# Patient Record
Sex: Male | Born: 1960 | Race: Black or African American | Hispanic: No | Smoking: Current every day smoker
Health system: Southern US, Community
[De-identification: ages and names within clinical notes are randomized; demographics above are authoritative.]

## PROBLEM LIST (undated history)

## (undated) DIAGNOSIS — R519 Headache, unspecified: Secondary | ICD-10-CM

## (undated) DIAGNOSIS — F329 Major depressive disorder, single episode, unspecified: Secondary | ICD-10-CM

## (undated) DIAGNOSIS — T7840XA Allergy, unspecified, initial encounter: Secondary | ICD-10-CM

## (undated) DIAGNOSIS — K219 Gastro-esophageal reflux disease without esophagitis: Secondary | ICD-10-CM

## (undated) DIAGNOSIS — M1990 Unspecified osteoarthritis, unspecified site: Secondary | ICD-10-CM

## (undated) DIAGNOSIS — R7303 Prediabetes: Secondary | ICD-10-CM

## (undated) DIAGNOSIS — F32A Depression, unspecified: Secondary | ICD-10-CM

## (undated) DIAGNOSIS — B999 Unspecified infectious disease: Secondary | ICD-10-CM

## (undated) DIAGNOSIS — R51 Headache: Secondary | ICD-10-CM

## (undated) DIAGNOSIS — Z8781 Personal history of (healed) traumatic fracture: Secondary | ICD-10-CM

## (undated) DIAGNOSIS — E119 Type 2 diabetes mellitus without complications: Secondary | ICD-10-CM

## (undated) DIAGNOSIS — Z09 Encounter for follow-up examination after completed treatment for conditions other than malignant neoplasm: Secondary | ICD-10-CM

## (undated) HISTORY — DX: Type 2 diabetes mellitus without complications: E11.9

## (undated) HISTORY — DX: Allergy, unspecified, initial encounter: T78.40XA

## (undated) HISTORY — PX: FRACTURE SURGERY: SHX138

---

## 2011-01-06 ENCOUNTER — Emergency Department (HOSPITAL_COMMUNITY)
Admission: EM | Admit: 2011-01-06 | Discharge: 2011-01-07 | Disposition: A | Discharge status: Home / Self Care | Attending: Emergency Medicine | Admitting: Emergency Medicine

## 2011-01-06 ENCOUNTER — Ambulatory Visit (INDEPENDENT_AMBULATORY_CARE_PROVIDER_SITE_OTHER)

## 2011-01-06 ENCOUNTER — Emergency Department (HOSPITAL_COMMUNITY)

## 2011-01-06 ENCOUNTER — Encounter: Payer: Self-pay | Admitting: *Deleted

## 2011-01-06 ENCOUNTER — Other Ambulatory Visit: Payer: Self-pay

## 2011-01-06 DIAGNOSIS — M79609 Pain in unspecified limb: Secondary | ICD-10-CM | POA: Insufficient documentation

## 2011-01-06 DIAGNOSIS — J3489 Other specified disorders of nose and nasal sinuses: Secondary | ICD-10-CM | POA: Insufficient documentation

## 2011-01-06 DIAGNOSIS — R079 Chest pain, unspecified: Secondary | ICD-10-CM

## 2011-01-06 DIAGNOSIS — M25519 Pain in unspecified shoulder: Secondary | ICD-10-CM | POA: Insufficient documentation

## 2011-01-06 DIAGNOSIS — M542 Cervicalgia: Secondary | ICD-10-CM | POA: Insufficient documentation

## 2011-01-06 DIAGNOSIS — R209 Unspecified disturbances of skin sensation: Secondary | ICD-10-CM | POA: Insufficient documentation

## 2011-01-06 LAB — DIFFERENTIAL
Basophils Absolute: 0 10*3/uL (ref 0.0–0.1)
Eosinophils Relative: 1 % (ref 0–5)
Lymphs Abs: 2.9 10*3/uL (ref 0.7–4.0)
Monocytes Absolute: 0.7 10*3/uL (ref 0.1–1.0)
Monocytes Relative: 9 % (ref 3–12)
Neutro Abs: 3.8 10*3/uL (ref 1.7–7.7)

## 2011-01-06 LAB — BASIC METABOLIC PANEL
BUN: 15 mg/dL (ref 6–23)
CO2: 25 mEq/L (ref 19–32)
Calcium: 9.3 mg/dL (ref 8.4–10.5)
Chloride: 99 mEq/L (ref 96–112)
Creatinine, Ser: 0.79 mg/dL (ref 0.50–1.35)

## 2011-01-06 LAB — CBC
HCT: 43.2 % (ref 39.0–52.0)
MCH: 29 pg (ref 26.0–34.0)
MCV: 83.6 fL (ref 78.0–100.0)
Platelets: 191 10*3/uL (ref 150–400)
RDW: 14.6 % (ref 11.5–15.5)
WBC: 7.5 10*3/uL (ref 4.0–10.5)

## 2011-01-06 MED ORDER — DIAZEPAM 5 MG PO TABS: 5.0000 mg | ORAL_TABLET | Freq: Three times a day (TID) | ORAL | Status: AC | PRN

## 2011-01-06 MED ORDER — NAPROXEN 375 MG PO TABS: 375.0000 mg | ORAL_TABLET | Freq: Two times a day (BID) | ORAL | Status: DC

## 2011-01-06 NOTE — ED Notes (Signed)
Pt begin feeling intermittent L shoulder pain (stabbing) 3 days prior that progressed to L thumb numbness and tingling.  Was sent here from [facility] urgent care.  No nitro or asa given.  20G to L AC.

## 2011-01-06 NOTE — ED Notes (Signed)
Pt states pain 1/10.
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
## 2011-01-06 NOTE — Discharge Instructions (Signed)
I suspect your pain may be coming from a pinched nerve in your neck. Possibly a muscle spasms. Your work up for your heat today was normal. However you still must follow up with primary care doctor for a full physical and further evaluation. Return if symptoms worsening. Take naprosyn as prescribed for pain. Take valium as prescribed for muscle spams.   Cervical Radiculopathy Cervical radiculopathy is a pinched nerve in the neck. When this happens you may have pain or numbness shooting from your neck all the way down into your arm and fingers. There are many causes of this problem. Sometimes this may happen from an injury or simply from muscle tightness in the neck from overuse. It may also happen from arthritis or bone problems. If there is no improvement after treatment, further studies may be done to find the exact cause. DIAGNOSIS  X-rays may be needed if the problems become long standing. Electromyograms may be done. This study is one in which the working of nerves and muscles is studied. HOME CARE INSTRUCTIONS   Applications of ice packs may be helpful. Ice can be used in a plastic bag with a towel around it to prevent frostbite to skin. This may be used every 2 hours for 20 to 30 minutes, or as needed, while awake or as directed by your caregiver.   Sleep at night with a flat pillow.   Only take over-the-counter or prescription medicines for pain, discomfort, or fever as directed by your caregiver.   If physical therapy was prescribed, follow your caregiver's directions. If a cervical collar or cervical traction device was prescribed, use them as directed.  SEEK IMMEDIATE MEDICAL CARE IF:   You have pain not controlled with medications.   You seem to be getting worse rather than better.   You develop weakness in your hand or arm.   You develop new numbness or loss of feeling in your arms or legs.   You develop loss of bowel or bladder control.   You have difficulty with walking or  balance, or develop clumsiness in the use of your arms or legs.  MAKE SURE YOU:   Understand these instructions.   Will watch your condition.   Will get help right away if you are not doing well or get worse.  Document Released: 09/22/2000 Document Revised: 07/13/2010 Document Reviewed: 05/03/2007 [facility] Patient Information 2012 [place], [place].

## 2011-01-06 NOTE — ED Notes (Signed)
Pt to Xray.

## 2011-01-07 NOTE — ED Notes (Signed)
Pt to ED c/o L shoulder pain x 3 days.  Pain is like a sharp stabbing.  Today he felt his whole L hand go numb while driving.  Pain increases on movement.  Pt denies any sob, nausea.  Pt has a job stocking boxes, but states he has not been doing anything out of the usual.

## 2011-01-07 NOTE — ED Notes (Signed)
D/c'd by another nurse.

## 2011-01-10 NOTE — ED Provider Notes (Signed)
Medical screening examination/treatment/procedure(s) were performed by non-physician practitioner and as supervising physician I was immediately available for consultation/collaboration.  Hurman Horn, MD 01/10/11 2132

## 2011-07-14 ENCOUNTER — Ambulatory Visit (INDEPENDENT_AMBULATORY_CARE_PROVIDER_SITE_OTHER): Admitting: Family Medicine

## 2011-07-14 VITALS — BP 142/88 | HR 100 | Temp 97.8°F | Resp 18 | Ht 68.0 in | Wt 241.0 lb

## 2011-07-14 DIAGNOSIS — R05 Cough: Secondary | ICD-10-CM

## 2011-07-14 DIAGNOSIS — J40 Bronchitis, not specified as acute or chronic: Secondary | ICD-10-CM

## 2011-07-14 DIAGNOSIS — R059 Cough, unspecified: Secondary | ICD-10-CM

## 2011-07-14 MED ORDER — AZITHROMYCIN 250 MG PO TABS: ORAL_TABLET | ORAL | Status: DC

## 2011-07-14 MED ORDER — HYDROCODONE-HOMATROPINE 5-1.5 MG/5ML PO SYRP: 5.0000 mL | ORAL_SOLUTION | Freq: Three times a day (TID) | ORAL | Status: DC | PRN

## 2011-07-14 MED ORDER — PREDNISONE 20 MG PO TABS: ORAL_TABLET | ORAL | Status: DC

## 2011-07-14 MED ORDER — MOMETASONE FURO-FORMOTEROL FUM 200-5 MCG/ACT IN AERO: 2.0000 | INHALATION_SPRAY | Freq: Two times a day (BID) | RESPIRATORY_TRACT | Status: DC

## 2011-07-14 NOTE — Progress Notes (Signed)
@[email or web address] @   Patient ID: Scott Hebert MRN: [id number], DOB: 03/09/1960, 51 y.o. Date of Encounter: 07/14/2011, 7:03 PM  Primary Physician: No primary provider on file.  Chief Complaint:  Chief Complaint  Patient presents with  . Cough    x 1.5 weeks  . Nasal Congestion    mainly chest congestion     HPI: 51 y.o. year old male presents with a 10 day history of nasal congestion, post nasal drip, sore throat, and cough. Mild sinus pressure. Afebrile. No chills. Nasal congestion thick and green/yellow. Cough is productive of green/yellow sputum and not associated with time of day. Ears feel full, leading to sensation of muffled hearing. Has tried OTC cold preps without success. No GI complaints. Appetite poor  No sick contacts, recent antibiotics, or recent travels.   No leg trauma, sedentary periods, h/o cancer, or tobacco use.  No past medical history on file.   Home Meds: Prior to Admission medications   Medication Sig Start Date End Date Taking? Authorizing Provider  diphenhydramine-acetaminophen (TYLENOL PM) 25-500 MG TABS Take 1 tablet by mouth at bedtime as needed. For sleep    Yes Historical Provider, MD  naphazoline (CLEAR EYES) 0.012 % ophthalmic solution Place 2 drops into both eyes daily as needed. For allergies    Yes Historical Provider, MD  azithromycin (ZITHROMAX Z-PAK) 250 MG tablet Take as directed on pack 07/14/11 07/19/11  Elvina Sidle, MD  HYDROcodone-homatropine [facility]) 5-1.5 MG/5ML syrup Take 5 mLs by mouth every 8 (eight) hours as needed for cough. 07/14/11 07/24/11  Elvina Sidle, MD  Mometasone Furo-Formoterol Fum 200-5 MCG/ACT AERO Inhale 2 puffs into the lungs 2 (two) times daily. 07/14/11   Elvina Sidle, MD  naproxen (NAPROSYN) 375 MG tablet Take 1 tablet (375 mg total) by mouth 2 (two) times daily. 01/06/11 01/06/12  Tatyana A Kirichenko, PA  naproxen sodium (ANAPROX) 220 MG tablet Take 220 mg by mouth daily as needed. For pain     Historical Provider, MD    predniSONE (DELTASONE) 20 MG tablet 2 daily 07/14/11   Elvina Sidle, MD    Allergies: No Known Allergies  History   Social History  . Marital Status: Single    Spouse Name: N/A    Number of Children: N/A  . Years of Education: N/A   Occupational History  . Not on file.   Social History Main Topics  . Smoking status: Current Everyday Smoker  . Smokeless tobacco: Not on file  . Alcohol Use: Not on file  . Drug Use: Not on file  . Sexually Active: Not on file   Other Topics Concern  . Not on file   Social History Narrative  . No narrative on file     Review of Systems: Constitutional: negative for chills, fever, night sweats or weight changes Cardiovascular: negative for chest pain or palpitations Respiratory: negative for hemoptysis, wheezing, or shortness of breath Abdominal: negative for abdominal pain, nausea, vomiting or diarrhea Dermatological: negative for rash Neurologic: negative for headache   Physical Exam: Blood pressure 142/88, pulse 100, temperature 97.8 F (36.6 C), temperature source Oral, resp. rate 18, height 5\' 8"  (1.727 m), weight 241 lb (109.317 kg), SpO2 98.00%., Body mass index is 36.64 kg/(m^2). General: Well developed, well nourished, in no acute distress. Head: Normocephalic, atraumatic, eyes without discharge, sclera non-icteric, nares are congested. Bilateral auditory canals clear, TM's are without perforation, pearly grey with reflective cone of light bilaterally. No sinus TTP. Oral cavity moist, dentition normal. Posterior pharynx with post  nasal drip and mild erythema. No peritonsillar abscess or tonsillar exudate. Neck: Supple. No thyromegaly. Full ROM. No lymphadenopathy. Lungs: Coarse breath sounds bilaterally without wheezes, rales, or rhonchi. Breathing is unlabored.  Heart: RRR with S1 S2. No murmurs, rubs, or gallops appreciated. Msk:  Strength and tone normal for age. Extremities: No clubbing or cyanosis. No edema. Neuro: Alert  and oriented X 3. Moves all extremities spontaneously. CNII-XII grossly in tact. Psych:  Responds to questions appropriately with a normal affect.   Labs:   ASSESSMENT AND PLAN:  51 y.o. year old male with bronchitis. - 1. Bronchitis  predniSONE (DELTASONE) 20 MG tablet, azithromycin (ZITHROMAX Z-PAK) 250 MG tablet, HYDROcodone-homatropine (HYCODAN) 5-1.5 MG/5ML syrup, Mometasone Furo-Formoterol Fum 200-5 MCG/ACT AERO     -Tylenol/Motrin prn -Rest/fluids -RTC precautions -RTC 3-5 days if no improvement  Signed, Elvina Sidle, MD 07/14/2011 7:03 PM

## 2011-07-14 NOTE — Patient Instructions (Addendum)

## 2011-07-15 ENCOUNTER — Encounter (HOSPITAL_COMMUNITY): Payer: Self-pay | Admitting: *Deleted

## 2011-07-15 ENCOUNTER — Emergency Department (HOSPITAL_COMMUNITY)
Admission: EM | Admit: 2011-07-15 | Discharge: 2011-07-15 | Disposition: A | Discharge status: Home / Self Care | Attending: Emergency Medicine | Admitting: Emergency Medicine

## 2011-07-15 DIAGNOSIS — T2000XA Burn of unspecified degree of head, face, and neck, unspecified site, initial encounter: Secondary | ICD-10-CM

## 2011-07-15 DIAGNOSIS — X19XXXA Contact with other heat and hot substances, initial encounter: Secondary | ICD-10-CM | POA: Insufficient documentation

## 2011-07-15 DIAGNOSIS — F17200 Nicotine dependence, unspecified, uncomplicated: Secondary | ICD-10-CM | POA: Insufficient documentation

## 2011-07-15 DIAGNOSIS — T2019XA Burn of first degree of multiple sites of head, face, and neck, initial encounter: Secondary | ICD-10-CM | POA: Insufficient documentation

## 2011-07-15 DIAGNOSIS — T2024XA Burn of second degree of nose (septum), initial encounter: Secondary | ICD-10-CM | POA: Insufficient documentation

## 2011-07-15 MED ORDER — IBUPROFEN 200 MG PO TABS
400.0000 mg | ORAL_TABLET | Freq: Once | ORAL | Status: DC
  Filled 2011-07-15: qty 2

## 2011-07-15 MED ORDER — BACITRACIN ZINC 500 UNIT/GM EX OINT
TOPICAL_OINTMENT | CUTANEOUS | Status: AC
  Filled 2011-07-15: qty 0.9

## 2011-07-15 MED ORDER — BACITRACIN 500 UNIT/GM EX OINT
1.0000 "application " | TOPICAL_OINTMENT | Freq: Two times a day (BID) | CUTANEOUS | Status: DC
  Administered 2011-07-15: 1 via TOPICAL
  Filled 2011-07-15: qty 0.9

## 2011-07-15 MED ORDER — BACITRACIN 500 UNIT/GM EX OINT: 1.0000 "application " | TOPICAL_OINTMENT | Freq: Two times a day (BID) | CUTANEOUS | Status: DC

## 2011-07-15 MED ORDER — OXYCODONE-ACETAMINOPHEN 5-325 MG PO TABS
2.0000 | ORAL_TABLET | Freq: Once | ORAL | Status: DC
  Filled 2011-07-15: qty 2

## 2011-07-15 MED ORDER — OXYCODONE-ACETAMINOPHEN 5-325 MG PO TABS: 1.0000 | ORAL_TABLET | ORAL | Status: AC | PRN

## 2011-07-15 MED ORDER — BACITRACIN ZINC 500 UNIT/GM EX OINT: TOPICAL_OINTMENT | Freq: Two times a day (BID) | CUTANEOUS | Status: DC
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
## 2011-07-19 ENCOUNTER — Other Ambulatory Visit: Payer: Self-pay | Admitting: Family Medicine

## 2011-08-17 ENCOUNTER — Ambulatory Visit (INDEPENDENT_AMBULATORY_CARE_PROVIDER_SITE_OTHER): Admitting: Internal Medicine

## 2011-08-17 ENCOUNTER — Ambulatory Visit

## 2011-08-17 VITALS — BP 124/84 | HR 80 | Temp 97.3°F | Resp 16 | Ht 68.0 in | Wt 245.0 lb

## 2011-08-17 DIAGNOSIS — R059 Cough, unspecified: Secondary | ICD-10-CM

## 2011-08-17 DIAGNOSIS — R05 Cough: Secondary | ICD-10-CM

## 2011-08-17 DIAGNOSIS — J9801 Acute bronchospasm: Secondary | ICD-10-CM

## 2011-08-17 DIAGNOSIS — F17200 Nicotine dependence, unspecified, uncomplicated: Secondary | ICD-10-CM

## 2011-08-17 DIAGNOSIS — J45909 Unspecified asthma, uncomplicated: Secondary | ICD-10-CM

## 2011-08-17 LAB — POCT CBC
Granulocyte percent: 51 %G (ref 37–80)
MID (cbc): 0.5 (ref 0–0.9)
MPV: 12.4 fL (ref 0–99.8)
POC Granulocyte: 3.1 (ref 2–6.9)
POC LYMPH PERCENT: 40.9 %L (ref 10–50)
POC MID %: 8.1 %M (ref 0–12)
Platelet Count, POC: 196 10*3/uL (ref 142–424)
RBC: 5.11 M/uL (ref 4.69–6.13)
RDW, POC: 16.1 %

## 2011-08-17 MED ORDER — ALBUTEROL SULFATE HFA 108 (90 BASE) MCG/ACT IN AERS: 2.0000 | INHALATION_SPRAY | Freq: Four times a day (QID) | RESPIRATORY_TRACT | Status: DC | PRN

## 2011-08-17 MED ORDER — METHYLPREDNISOLONE ACETATE 80 MG/ML IJ SUSP
120.0000 mg | Freq: Once | INTRAMUSCULAR | Status: AC
  Administered 2011-08-17: 120 mg via INTRAMUSCULAR

## 2011-08-17 MED ORDER — AZITHROMYCIN 500 MG PO TABS: 500.0000 mg | ORAL_TABLET | Freq: Every day | ORAL | Status: AC

## 2011-08-17 MED ORDER — HYDROCODONE-ACETAMINOPHEN 7.5-500 MG/15ML PO SOLN: 5.0000 mL | Freq: Four times a day (QID) | ORAL | Status: AC | PRN

## 2011-08-17 NOTE — Patient Instructions (Signed)
Chronic Asthmatic Bronchitis  Chronic asthmatic bronchitis is often a complication of frequent asthma and/or bronchitis. After a long enough period of time, the continual airflow blockage is present in spite of treatment for asthma. The medications that used to treat asthma no longer work. The symptoms of chronic bronchitis may also be present. Bronchitis is an inflammation of the breathing tubules in the lungs. The combination of asthma, chronic bronchitis, and emphysema all affect the small breathing tubules (bronchial tree) in our lungs. It is a common condition. The problems from each are similar and overlap with each other so are sometimes hard to diagnose.  When the asthma and bronchitis are combined, there is usually inflammation and infection. The small bronchial tubes produce more mucus. This blocks the airways and makes breathing harder. Usually this process is caused more by external irritants than infection. Smokers with chronic bronchitis are at a greater risk to develop asthmatic bronchitis.  CAUSES    Why some people with asthma go on to develop chronic asthmatic bronchitis is not known. Smoking and environmental toxins or allergens seem to play a role. There are wide differences in who is susceptible.   Abnormalities of the small airways may develop in persons with persistent asthma. Asthmatics can be uncommonly subject to the effects of smoking. Asthma is also found associated with a number of other diseases.  SYMPTOMS   Asthma, chronic bronchitis, and emphysema all cause symptoms of cough, wheezing, shortness of breath, and recurring infections. There may also be chest discomfort. All of the above symptoms happen more often in chronic asthmatic bronchitis.  DIAGNOSIS    Asthma, chronic bronchitis, and emphysema all affect the entire bronchial tree. This makes it difficult on exam to tell them apart. Other tests of the lungs are done to prove a diagnosis. These are called pulmonary function  tests.  TREATMENT    The asthmatic condition itself must always be treated.   Infection can be treated with antibiotics (medications to kill germs).   Serious infections may require hospitalization. These can include pneumonia, sinus infections, and acute bronchitis.  HOME CARE INSTRUCTIONS   Use prescription medications as ordered by your caregiver.   Avoid pollen, dust, animal dander, molds, smoke, and other things that cause attacks at home and at work.   You may have fewer attacks if you decrease dust in your home. Electrostatic air cleaners may help.   It may help to replace your pillows or mattress with materials less likely to cause allergies.   If you are not on fluid restriction, drink 8 to 10 glasses of water each day.   Discuss possible exercise routines with your caregiver.   If animal dander is the cause of asthma, you may need to get rid of pets.   It is important that you:   Become educated about your medical condition.   Participate in maintaining wellness.   Seek medical care promptly or immediately as indicated below.   Delay in seeking medical attention could cause permanent injury and may be a risk to your life.  SEEK MEDICAL CARE IF   You have wheezing and shortness of breath even if taking medicine to prevent attacks.   An oral temperature above 102 F (38.9 C)   You have muscle aches, chest pain, or thickening of sputum.   Your sputum changes from clear or white to yellow, green, gray, or bloody.   You have any problems that may be related to the medicine you are taking (such as   a rash, itching, swelling, or trouble breathing).  SEEK IMMEDIATE MEDICAL CARE IF:   Your usual medicines do not stop your wheezing.   There is increased coughing and/or shortness of breath.   You have increased difficulty breathing.  MAKE SURE YOU:    Understand these instructions.   Will watch your condition.   Will get help right away if you are not doing well or get worse.  Document  Released: 10/15/2005 Document Revised: 12/17/2010 Document Reviewed: 12/13/2006  [employer] Patient Information 2012 [employer].

## 2011-08-17 NOTE — Progress Notes (Signed)
Subjective:    Patient ID: Scott Hebert, male    DOB: January 07, 1961, 51 y.o.   MRN: [id number]  HPI Smoker and has bronchospasm in [place]. Was in [place] last week and lungs were clear. No sob, but coughing and yellow sputum. See Dr. Cain Saupe w/up and tx   Review of Systems     Objective:   Physical Exam Rhinorrhea Lungs wheezes and rhonchi.  [facility] reading (PRIMARY) by  Dr. Perrin Maltese nad cxr Results for orders placed during the hospital encounter of 01/06/11  CBC      Component Value Range   WBC 7.5  4.0 - 10.5 K/uL   RBC 5.17  4.22 - 5.81 MIL/uL   Hemoglobin 15.0  13.0 - 17.0 g/dL   HCT 14.7  82.9 - 56.2 %   MCV 83.6  78.0 - 100.0 fL   MCH 29.0  26.0 - 34.0 pg   MCHC 34.7  30.0 - 36.0 g/dL   RDW 13.0  86.5 - 78.4 %   Platelets 191  150 - 400 K/uL  DIFFERENTIAL      Component Value Range   Neutrophils Relative 51  43 - 77 %   Lymphocytes Relative 39  12 - 46 %   Monocytes Relative 9  3 - 12 %   Eosinophils Relative 1  0 - 5 %   Basophils Relative 0  0 - 1 %   Neutro Abs 3.8  1.7 - 7.7 K/uL   Lymphs Abs 2.9  0.7 - 4.0 K/uL   Monocytes Absolute 0.7  0.1 - 1.0 K/uL   Eosinophils Absolute 0.1  0.0 - 0.7 K/uL   Basophils Absolute 0.0  0.0 - 0.1 K/uL   Smear Review MORPHOLOGY UNREMARKABLE    BASIC METABOLIC PANEL      Component Value Range   Sodium 136  135 - 145 mEq/L   Potassium 4.3  3.5 - 5.1 mEq/L   Chloride 99  96 - 112 mEq/L   CO2 25  19 - 32 mEq/L   Glucose, Bld 88  70 - 99 mg/dL   BUN 15  6 - 23 mg/dL   Creatinine, Ser 6.96  0.50 - 1.35 mg/dL   Calcium 9.3  8.4 - 29.5 mg/dL   GFR calc non Af Amer >90  >90 mL/min   GFR calc Af Amer >90  >90 mL/min  TROPONIN I      Component Value Range   Troponin I <0.30  <0.30 ng/mL    Results for orders placed during the hospital encounter of 01/06/11  CBC      Component Value Range   WBC 7.5  4.0 - 10.5 K/uL   RBC 5.17  4.22 - 5.81 MIL/uL   Hemoglobin 15.0  13.0 - 17.0 g/dL   HCT 28.4  13.2 - 44.0 %   MCV 83.6  78.0 - 100.0 fL     MCH 29.0  26.0 - 34.0 pg   MCHC 34.7  30.0 - 36.0 g/dL   RDW 10.2  72.5 - 36.6 %   Platelets 191  150 - 400 K/uL  DIFFERENTIAL      Component Value Range   Neutrophils Relative 51  43 - 77 %   Lymphocytes Relative 39  12 - 46 %   Monocytes Relative 9  3 - 12 %   Eosinophils Relative 1  0 - 5 %   Basophils Relative 0  0 - 1 %   Neutro Abs 3.8  1.7 - 7.7 K/uL  Lymphs Abs 2.9  0.7 - 4.0 K/uL   Monocytes Absolute 0.7  0.1 - 1.0 K/uL   Eosinophils Absolute 0.1  0.0 - 0.7 K/uL   Basophils Absolute 0.0  0.0 - 0.1 K/uL   Smear Review MORPHOLOGY UNREMARKABLE    BASIC METABOLIC PANEL      Component Value Range   Sodium 136  135 - 145 mEq/L   Potassium 4.3  3.5 - 5.1 mEq/L   Chloride 99  96 - 112 mEq/L   CO2 25  19 - 32 mEq/L   Glucose, Bld 88  70 - 99 mg/dL   BUN 15  6 - 23 mg/dL   Creatinine, Ser 1.61  0.50 - 1.35 mg/dL   Calcium 9.3  8.4 - 09.6 mg/dL   GFR calc non Af Amer >90  >90 mL/min   GFR calc Af Amer >90  >90 mL/min  TROPONIN I      Component Value Range   Troponin I <0.30  <0.30 ng/mL             Assessment & Plan:  Asthmatic bronchitis recurrent Must quit smoking Zithromax 500mg Leandro Reasoner /albuterol/Depomedrol im

## 2011-12-07 ENCOUNTER — Ambulatory Visit (INDEPENDENT_AMBULATORY_CARE_PROVIDER_SITE_OTHER): Admitting: Physician Assistant

## 2011-12-07 VITALS — BP 146/88 | HR 69 | Temp 97.2°F | Resp 16 | Ht 69.0 in | Wt 243.0 lb

## 2011-12-07 DIAGNOSIS — L03811 Cellulitis of head [any part, except face]: Secondary | ICD-10-CM

## 2011-12-07 DIAGNOSIS — L02818 Cutaneous abscess of other sites: Secondary | ICD-10-CM

## 2011-12-07 DIAGNOSIS — T7840XA Allergy, unspecified, initial encounter: Secondary | ICD-10-CM | POA: Insufficient documentation

## 2011-12-07 MED ORDER — DOXYCYCLINE HYCLATE 100 MG PO CAPS: 100.0000 mg | ORAL_CAPSULE | Freq: Two times a day (BID) | ORAL | Status: DC

## 2011-12-07 MED ORDER — HYDROCODONE-ACETAMINOPHEN 5-325 MG PO TABS: 1.0000 | ORAL_TABLET | Freq: Four times a day (QID) | ORAL | Status: DC | PRN

## 2011-12-07 MED ORDER — MELOXICAM 15 MG PO TABS: 15.0000 mg | ORAL_TABLET | Freq: Every day | ORAL | Status: DC

## 2011-12-07 NOTE — Progress Notes (Signed)
  Subjective:    Patient ID: Scott Hebert, male    DOB: [birth year], 51 y.o.   MRN: [id number]  HPI  This 51 y.o. male presents for evaluation of a painful bump on the back of his head/neck x 3 days.  No fever, chills.  Has had this before, but never this bad.  No drainage.  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  Cleans his clippers after every use.   Past Medical History  Diagnosis Date  . Allergy     History reviewed. No pertinent past surgical history.  Prior to Admission medications   Medication Sig Start Date End Date Taking? Authorizing Provider  albuterol (PROVENTIL HFA;VENTOLIN HFA) 108 (90 BASE) MCG/ACT inhaler Inhale 2 puffs into the lungs every 6 (six) hours as needed for wheezing. 08/17/11 08/16/12 Yes Jonita Albee, MD  diphenhydramine-acetaminophen (TYLENOL PM) 25-500 MG TABS Take 1 tablet by mouth at bedtime as needed. For sleep    Yes Historical Provider, MD    Allergies  Allergen Reactions  . Shellfish-Derived Products Anaphylaxis    History   Social History  . Marital Status: Single    Spouse Name: n/a    Number of Children: 1  . Years of Education: 12   Occupational History  . Material [employer]    Social History Main Topics  . Smoking status: Current Every Day Smoker -- 0.5 packs/day    Types: Cigarettes  . Smokeless tobacco: Never Used     Comment: using electronic vapor cigarette to help him quit smoking.  . Alcohol Use: 0.0 - 2.0 oz/week    0-4 drink(s) per week     Comment: every other week  . Drug Use: No  . Sexually Active: Yes -- Male partner(s)   Other Topics Concern  . Not on file   Social History Narrative   Was in the [employer] x 20 years.  Lives alone.    Family History  Problem Relation Age of Onset  . Cancer Mother     Review of Systems As above.    Objective:   Physical Exam Blood pressure 146/88, pulse 69, temperature 97.2 F (36.2 C), temperature source Oral, resp. rate 16, height 5\' 9"  (1.753 m), weight 243 lb (110.224 kg), SpO2 99.00%.  Body mass index is 35.88 kg/(m^2). Well-developed, well nourished BM who is awake, alert and oriented, in NAD. HEENT: [place]/AT, sclera and conjunctiva are clear.   Neck: supple, non-tender, no lymphadenopathy, thyromegaly. Posterior neck lipoma. Lungs: normal effort Skin: warm and dry without rash.  There is a 3-4 cm area of induration, with central pustule surrounded by crust on the back of the neck/scalp, just within the hair.  It is quite tender.  No fluctuance. The area was cleansed with an alcohol prep pad and the pustule was unroofed.  Gentle pressure expressed purulent material, which was sent for culture. Psychologic: good mood and appropriate affect, normal speech and behavior.     Assessment & Plan:   1. Cellulitis of head or scalp  Wound culture, doxycycline (VIBRAMYCIN) 100 MG capsule, HYDROcodone-acetaminophen (NORCO) 5-325 MG per tablet, meloxicam (MOBIC) 15 MG tablet   Warm compresses.  RTC if symptoms no better in 48 hours, sooner if worsen-may need I&D. Schedule health maintenance visit at his convenience.

## 2011-12-07 NOTE — Patient Instructions (Signed)
Apply a warm compress to the area for 15-20 minutes 3-4 times daily.  If it's not improving in the next 48 hours, or if it worsens, return for re-evaluation and possible incision and drainage.

## 2011-12-10 LAB — WOUND CULTURE

## 2012-01-18 ENCOUNTER — Ambulatory Visit (INDEPENDENT_AMBULATORY_CARE_PROVIDER_SITE_OTHER): Admitting: Family Medicine

## 2012-01-18 VITALS — BP 132/89 | HR 89 | Temp 98.6°F | Resp 16 | Ht 68.0 in | Wt 245.0 lb

## 2012-01-18 DIAGNOSIS — L738 Other specified follicular disorders: Secondary | ICD-10-CM

## 2012-01-18 DIAGNOSIS — L03818 Cellulitis of other sites: Secondary | ICD-10-CM

## 2012-01-18 DIAGNOSIS — L02818 Cutaneous abscess of other sites: Secondary | ICD-10-CM

## 2012-01-18 DIAGNOSIS — B350 Tinea barbae and tinea capitis: Secondary | ICD-10-CM

## 2012-01-18 DIAGNOSIS — L03811 Cellulitis of head [any part, except face]: Secondary | ICD-10-CM

## 2012-01-18 MED ORDER — DOXYCYCLINE HYCLATE 100 MG PO CAPS: 100.0000 mg | ORAL_CAPSULE | Freq: Two times a day (BID) | ORAL | Status: DC

## 2012-01-18 NOTE — Progress Notes (Signed)
 [facility]:  Office Visit  Chief Complaint:  Chief Complaint  Patient presents with  . Medication Refill    HPI: Scott Hebert is a 52 y.o. male who complains of  1 day history of folliculitis. H/o MRSA.   Past Medical History  Diagnosis Date  . Allergy    History reviewed. No pertinent past surgical history. History   Social History  . Marital Status: Single    Spouse Name: n/a    Number of Children: 1  . Years of Education: 12   Occupational History  . Material [employer]    Social History Main Topics  . Smoking status: Current Every Day Smoker -- 0.5 packs/day    Types: Cigarettes  . Smokeless tobacco: Never Used     Comment: using electronic vapor cigarette to help him quit smoking.  . Alcohol Use: 0.0 - 2.0 oz/week    0-4 drink(s) per week     Comment: every other week  . Drug Use: No  . Sexually Active: Yes -- Male partner(s)    Birth Control/ Protection: None   Other Topics Concern  . None   Social History Narrative   Was in the [employer] x 20 years.  Lives alone.   Family History  Problem Relation Age of Onset  . Cancer Mother    Allergies  Allergen Reactions  . Shellfish-Derived Products Anaphylaxis   Prior to Admission medications   Medication Sig Start Date End Date Taking? Authorizing Provider  meloxicam (MOBIC) 15 MG tablet Take 1 tablet (15 mg total) by mouth daily. 12/07/11  Yes Chelle S Jeffery, PA-C  albuterol (PROVENTIL HFA;VENTOLIN HFA) 108 (90 BASE) MCG/ACT inhaler Inhale 2 puffs into the lungs every 6 (six) hours as needed for wheezing. 08/17/11 08/16/12  Jonita Albee, MD  doxycycline (VIBRAMYCIN) 100 MG capsule Take 1 capsule (100 mg total) by mouth 2 (two) times daily. 12/07/11   Chelle Tessa Lerner, PA-C  HYDROcodone-acetaminophen (NORCO) 5-325 MG per tablet Take 1 tablet by mouth every 6 (six) hours as needed for pain. 12/07/11   Chelle S Jeffery, PA-C     ROS: The patient denies fevers, chills, night sweats,  unintentional weight loss, chest pain, palpitations, wheezing, dyspnea on exertion, nausea, vomiting, abdominal pain, dysuria, hematuria, melena, numbness, weakness, or tingling.   All other systems have been reviewed and were otherwise negative with the exception of those mentioned in the HPI and as above.    PHYSICAL EXAM: Filed Vitals:   01/18/12 1706  BP: 132/89  Pulse: 89  Temp: 98.6 F (37 C)  Resp: 16   Filed Vitals:   01/18/12 1706  Height: 5\' 8"  (1.727 m)  Weight: 245 lb (111.131 kg)   Body mass index is 37.25 kg/(m^2).  General: Alert, no acute distress HEENT:  Normocephalic, atraumatic, oropharynx patent.  Cardiovascular:  Regular rate and rhythm, no rubs murmurs or gallops.  No Carotid bruits, radial pulse intact. No pedal edema.  Respiratory: Clear to auscultation bilaterally.  No wheezes, rales, or rhonchi.  No cyanosis, no use of accessory musculature GI: No organomegaly, abdomen is soft and non-tender, positive bowel sounds.  No masses. Skin: + neck folliculitis, non fluctuant. Tender, no dc , sight erythema and warmth Neurologic: Facial musculature symmetric. Psychiatric: Patient is appropriate throughout our interaction. Lymphatic: No cervical lymphadenopathy Musculoskeletal: Gait intact.   LABS: Results for orders placed in visit on 12/07/11  WOUND CULTURE      Component Value Range   Culture  Value: Abundant METHICILLIN RESISTANT STAPHYLOCOCCUS AUREUS   GRAM STAIN Rare     GRAM STAIN WBC present-predominately PMN     GRAM STAIN Rare Squamous Epithelial Cells Present     GRAM STAIN       Value: Abundant GRAM POSITIVE COCCI IN PAIRS In Clusters In Chains   Organism ID, Bacteria METHICILLIN RESISTANT STAPHYLOCOCCUS AUREUS       EKG/XRAY:   Primary read interpreted by Dr. Conley Rolls at [facility].   ASSESSMENT/PLAN: Encounter Diagnoses  Name Primary?  . Folliculitis barbae Yes  . Cellulitis of head or scalp    Rx Doxycycline Warm compresses F/u  prn    ,  PHUONG, DO 01/18/2012 5:07 PM

## 2012-02-01 ENCOUNTER — Other Ambulatory Visit: Payer: Self-pay | Admitting: Physician Assistant

## 2012-02-26 ENCOUNTER — Encounter (HOSPITAL_COMMUNITY): Payer: Self-pay | Admitting: *Deleted

## 2012-02-26 ENCOUNTER — Emergency Department (HOSPITAL_COMMUNITY)

## 2012-02-26 ENCOUNTER — Emergency Department (HOSPITAL_COMMUNITY)
Admission: EM | Admit: 2012-02-26 | Discharge: 2012-02-27 | Disposition: A | Discharge status: Home / Self Care | Attending: Emergency Medicine | Admitting: Emergency Medicine

## 2012-02-26 DIAGNOSIS — F17200 Nicotine dependence, unspecified, uncomplicated: Secondary | ICD-10-CM | POA: Insufficient documentation

## 2012-02-26 DIAGNOSIS — R059 Cough, unspecified: Secondary | ICD-10-CM | POA: Insufficient documentation

## 2012-02-26 DIAGNOSIS — R0789 Other chest pain: Secondary | ICD-10-CM

## 2012-02-26 DIAGNOSIS — Z8709 Personal history of other diseases of the respiratory system: Secondary | ICD-10-CM | POA: Insufficient documentation

## 2012-02-26 DIAGNOSIS — R071 Chest pain on breathing: Secondary | ICD-10-CM | POA: Insufficient documentation

## 2012-02-26 DIAGNOSIS — R05 Cough: Secondary | ICD-10-CM | POA: Insufficient documentation

## 2012-02-26 LAB — CBC
Hemoglobin: 12.9 g/dL — ABNORMAL LOW (ref 13.0–17.0)
MCH: 27.7 pg (ref 26.0–34.0)
MCHC: 32.8 g/dL (ref 30.0–36.0)
MCV: 84.5 fL (ref 78.0–100.0)
RBC: 4.65 MIL/uL (ref 4.22–5.81)

## 2012-02-26 NOTE — ED Notes (Signed)
Pt reports chest pain that began while at work today approx 1300 - pt admits to some shortness of breath and dizziness. Pt admits to recently being diagnosed w/ bronchitis and having non-productive cough. Denies n/v or diaphoresis. Pt is to left chest and described as heaviness.

## 2012-02-26 NOTE — ED Notes (Signed)
EKG given to EDP, Cook,MD.

## 2012-02-27 LAB — BASIC METABOLIC PANEL
BUN: 13 mg/dL (ref 6–23)
CO2: 25 mEq/L (ref 19–32)
Calcium: 8.6 mg/dL (ref 8.4–10.5)
Creatinine, Ser: 0.86 mg/dL (ref 0.50–1.35)
GFR calc non Af Amer: >90 mL/min (ref >90)
Glucose, Bld: 118 mg/dL — ABNORMAL HIGH (ref 70–99)

## 2012-02-27 MED ORDER — HYDROCOD POLST-CHLORPHEN POLST 10-8 MG/5ML PO LQCR
5.0000 mL | Freq: Once | ORAL | Status: AC
  Administered 2012-02-27: 5 mL via ORAL
  Filled 2012-02-27: qty 5

## 2012-02-27 MED ORDER — ALBUTEROL SULFATE HFA 108 (90 BASE) MCG/ACT IN AERS
1.0000 | INHALATION_SPRAY | RESPIRATORY_TRACT | Status: DC | PRN
  Administered 2012-02-27: 2 via RESPIRATORY_TRACT
  Filled 2012-02-27: qty 6.7

## 2012-02-27 MED ORDER — HYDROCOD POLST-CHLORPHEN POLST 10-8 MG/5ML PO LQCR: 5.0000 mL | Freq: Two times a day (BID) | ORAL | Status: DC | PRN

## 2012-02-27 NOTE — ED Notes (Signed)
Talked with lab and added on tropi-s.

## 2012-02-27 NOTE — ED Provider Notes (Signed)
History     CSN: [id number]  Arrival date & time 02/26/12  2207   First MD Initiated Contact with Patient 02/26/12 2300      Chief Complaint  Patient presents with  . Chest Pain    (Consider location/radiation/quality/duration/timing/severity/associated sxs/prior treatment) HPI 52 year old male presents to emergency department complaining of left upper chest/shoulder pain with some radiation into his left arm. He reports symptoms started around 1:00 today. Patient has been ill for the last week with flu like symptoms, fever cough body aches. Patient felt better today, and was at work when symptoms started. Upon waking today, patient has had increasing cough. He has history of bronchitis in the past. Pain is described as a pressure. It is worse with palpation and with movement. He was concerned about possible heart attack. No family history of heart disease. Patient smokes a half a pack a day. He denies any nausea or diaphoresis. Past Medical History  Diagnosis Date  . Allergy     History reviewed. No pertinent past surgical history.  Family History  Problem Relation Age of Onset  . Cancer Mother     History  Substance Use Topics  . Smoking status: Current Every Day Smoker -- 0.50 packs/day    Types: Cigarettes  . Smokeless tobacco: Never Used     Comment: using electronic vapor cigarette to help him quit smoking.  . Alcohol Use: 0 - 2 oz/week    0-4 drink(s) per week     Comment: every other week      Review of Systems  All other systems reviewed and are negative.    Allergies  Shellfish-derived products  Home Medications   Current Outpatient Rx  Name  Route  Sig  Dispense  Refill  . doxycycline (VIBRAMYCIN) 100 MG capsule   Oral   Take 1 capsule (100 mg total) by mouth 2 (two) times daily.   20 capsule   0   . albuterol (PROVENTIL HFA;VENTOLIN HFA) 108 (90 BASE) MCG/ACT inhaler   Inhalation   Inhale 2 puffs into the lungs every 6 (six) hours as needed  for wheezing.   1 Inhaler   3   . chlorpheniramine-HYDROcodone (TUSSIONEX PENNKINETIC ER) 10-8 MG/5ML LQCR   Oral   Take 5 mLs by mouth every 12 (twelve) hours as needed (cough).   140 mL   0     BP 130/92  Pulse 69  Temp(Src) 97.9 F (36.6 C) (Oral)  SpO2 98%  Physical Exam  Nursing note and vitals reviewed. Constitutional: He is oriented to person, place, and time. He appears well-developed and well-nourished.  HENT:  Head: Normocephalic and atraumatic.  Nose: Nose normal.  Mouth/Throat: Oropharynx is clear and moist.  Eyes: Conjunctivae and EOM are normal. Pupils are equal, round, and reactive to light.  Neck: Normal range of motion. Neck supple. No JVD present. No tracheal deviation present. No thyromegaly present.  Cardiovascular: Normal rate, regular rhythm, normal heart sounds and intact distal pulses.  Exam reveals no gallop and no friction rub.   No murmur heard. Pulmonary/Chest: Effort normal and breath sounds normal. No stridor. No respiratory distress. He has no wheezes. He has no rales. He exhibits tenderness (palpation of left upper chest wall and shoulder reproduces pain that he has beenexperiencing today).  Cough noted  Abdominal: Soft. Bowel sounds are normal. He exhibits no distension and no mass. There is no tenderness. There is no rebound and no guarding.  Musculoskeletal: Normal range of motion. He exhibits no edema  and no tenderness.  Lymphadenopathy:    He has no cervical adenopathy.  Neurological: He is alert and oriented to person, place, and time. He exhibits normal muscle tone. Coordination normal.  Skin: Skin is warm and dry. No rash noted. No erythema. No pallor.  Psychiatric: He has a normal mood and affect. His behavior is normal. Judgment and thought content normal.    ED Course  Procedures (including critical care time)  Labs Reviewed  CBC - Abnormal; Notable for the following:    Hemoglobin 12.9 (*)    RDW 15.7 (*)    All other  components within normal limits  BASIC METABOLIC PANEL - Abnormal; Notable for the following:    Potassium 3.3 (*)    Glucose, Bld 118 (*)    All other components within normal limits  TROPONIN I   Dg Chest 2 View  02/26/2012  *RADIOLOGY REPORT*  Clinical Data: Chest pain  CHEST - 2 VIEW  Comparison: 08/17/2011  Findings: Normal heart size.  No pleural effusion or edema.  No airspace consolidation identified.  Review of the visualized osseous structures is unremarkable.  IMPRESSION:  1.  No acute cardiopulmonary abnormalities.   Original Report Authenticated By: Signa Kell, M.D.     Date: 02/26/2012  Rate: 84  Rhythm: normal sinus rhythm  QRS Axis: normal  Intervals: normal  ST/T Wave abnormalities: normal  Conduction Disutrbances:none  Narrative Interpretation:   Old EKG Reviewed: none available     1. Chest wall pain   2. Cough       MDM  52 year old male with chest discomfort since 1:00 today. EKG is unremarkable, troponin and chest x-ray without significant findings. Patient strongly advised to stop smoking, and given outpatient followup information.        Olivia Mackie, MD 02/27/12 [phone]

## 2012-06-08 ENCOUNTER — Ambulatory Visit (INDEPENDENT_AMBULATORY_CARE_PROVIDER_SITE_OTHER): Admitting: Family Medicine

## 2012-06-08 VITALS — BP 128/84 | HR 85 | Temp 97.9°F | Resp 18 | Ht 67.5 in | Wt 233.2 lb

## 2012-06-08 DIAGNOSIS — L738 Other specified follicular disorders: Secondary | ICD-10-CM

## 2012-06-08 DIAGNOSIS — L02818 Cutaneous abscess of other sites: Secondary | ICD-10-CM

## 2012-06-08 DIAGNOSIS — B350 Tinea barbae and tinea capitis: Secondary | ICD-10-CM

## 2012-06-08 DIAGNOSIS — L03818 Cellulitis of other sites: Secondary | ICD-10-CM

## 2012-06-08 DIAGNOSIS — IMO0002 Reserved for concepts with insufficient information to code with codable children: Secondary | ICD-10-CM

## 2012-06-08 MED ORDER — SULFAMETHOXAZOLE-TRIMETHOPRIM 800-160 MG PO TABS: 1.0000 | ORAL_TABLET | Freq: Two times a day (BID) | ORAL | Status: DC

## 2012-06-08 MED ORDER — HYDROCODONE-ACETAMINOPHEN 5-325 MG PO TABS: ORAL_TABLET | ORAL | Status: DC

## 2012-06-08 NOTE — Progress Notes (Signed)
Subjective: 52 year old man who has had some abscesses in the past. He was treated for MRSA previously. The last few days he's developed a placed behind his left ear. It's very painful and swollen. He is not known to be allergic to any antibiotics but the doxycycline previously bothered his reflux some.  Objective:  pleasant gentleman who has a lesion behind his left ear it has about a 2.5 CM area of primary induration. He also has a little follicle up in the scalp that I can see a little pus draining from. He says there are several places that bothered him a little. The seem to happen after he gets a haircut.  Assessment: Left postauricular abscess, probable MRSA Folliculitis of scalp  Plan: Bactrim DS twice a day for 10 days I&D abscess

## 2012-06-08 NOTE — Patient Instructions (Signed)
Keep the wound clean as directed  Take the antibiotic one twice daily

## 2012-06-11 LAB — WOUND CULTURE: Gram Stain: NONE SEEN

## 2016-01-17 ENCOUNTER — Observation Stay (HOSPITAL_COMMUNITY)
Admission: EM | Admit: 2016-01-17 | Discharge: 2016-01-18 | Disposition: A | Discharge status: Home / Self Care | Attending: Orthopedic Surgery | Admitting: Orthopedic Surgery

## 2016-01-17 ENCOUNTER — Emergency Department (HOSPITAL_COMMUNITY)

## 2016-01-17 ENCOUNTER — Encounter (HOSPITAL_COMMUNITY): Payer: Self-pay | Admitting: Emergency Medicine

## 2016-01-17 DIAGNOSIS — Y93H9 Activity, other involving exterior property and land maintenance, building and construction: Secondary | ICD-10-CM | POA: Insufficient documentation

## 2016-01-17 DIAGNOSIS — S82301A Unspecified fracture of lower end of right tibia, initial encounter for closed fracture: Secondary | ICD-10-CM

## 2016-01-17 DIAGNOSIS — J449 Chronic obstructive pulmonary disease, unspecified: Secondary | ICD-10-CM | POA: Insufficient documentation

## 2016-01-17 DIAGNOSIS — Y998 Other external cause status: Secondary | ICD-10-CM | POA: Insufficient documentation

## 2016-01-17 DIAGNOSIS — F17210 Nicotine dependence, cigarettes, uncomplicated: Secondary | ICD-10-CM | POA: Diagnosis not present

## 2016-01-17 DIAGNOSIS — Y92007 Garden or yard of unspecified non-institutional (private) residence as the place of occurrence of the external cause: Secondary | ICD-10-CM | POA: Diagnosis not present

## 2016-01-17 DIAGNOSIS — S82839A Other fracture of upper and lower end of unspecified fibula, initial encounter for closed fracture: Secondary | ICD-10-CM | POA: Diagnosis present

## 2016-01-17 DIAGNOSIS — W010XXA Fall on same level from slipping, tripping and stumbling without subsequent striking against object, initial encounter: Secondary | ICD-10-CM | POA: Diagnosis not present

## 2016-01-17 DIAGNOSIS — S82831A Other fracture of upper and lower end of right fibula, initial encounter for closed fracture: Secondary | ICD-10-CM | POA: Diagnosis present

## 2016-01-17 DIAGNOSIS — Z7982 Long term (current) use of aspirin: Secondary | ICD-10-CM | POA: Insufficient documentation

## 2016-01-17 LAB — COMPREHENSIVE METABOLIC PANEL
ALT: 33 U/L (ref 17–63)
ANION GAP: 10 (ref 5–15)
AST: 24 U/L (ref 15–41)
Albumin: 4.6 g/dL (ref 3.5–5.0)
Alkaline Phosphatase: 62 U/L (ref 38–126)
BUN: 15 mg/dL (ref 6–20)
CALCIUM: 9.3 mg/dL (ref 8.9–10.3)
CHLORIDE: 104 mmol/L (ref 101–111)
CO2: 25 mmol/L (ref 22–32)
CREATININE: 0.89 mg/dL (ref 0.61–1.24)
Glucose, Bld: 128 mg/dL — ABNORMAL HIGH (ref 65–99)
Potassium: 4 mmol/L (ref 3.5–5.1)
SODIUM: 139 mmol/L (ref 135–145)
Total Bilirubin: 0.6 mg/dL (ref 0.3–1.2)
Total Protein: 8.4 g/dL — ABNORMAL HIGH (ref 6.5–8.1)

## 2016-01-17 LAB — CBC
HCT: 43.7 % (ref 39.0–52.0)
HEMOGLOBIN: 14.2 g/dL (ref 13.0–17.0)
MCH: 27.6 pg (ref 26.0–34.0)
MCHC: 32.5 g/dL (ref 30.0–36.0)
MCV: 84.9 fL (ref 78.0–100.0)
PLATELETS: 191 10*3/uL (ref 150–400)
RBC: 5.15 MIL/uL (ref 4.22–5.81)
RDW: 15.7 % — ABNORMAL HIGH (ref 11.5–15.5)
WBC: 9.2 10*3/uL (ref 4.0–10.5)

## 2016-01-17 MED ORDER — HYDROMORPHONE HCL 2 MG/ML IJ SOLN: 1.0000 mg | INTRAMUSCULAR | Status: DC | PRN

## 2016-01-17 MED ORDER — CEFAZOLIN SODIUM-DEXTROSE 2-4 GM/100ML-% IV SOLN
2.0000 g | INTRAVENOUS | Status: DC
  Filled 2016-01-17: qty 100

## 2016-01-17 MED ORDER — NICOTINE 14 MG/24HR TD PT24
14.0000 mg | MEDICATED_PATCH | Freq: Once | TRANSDERMAL | Status: DC
  Administered 2016-01-17: 14 mg via TRANSDERMAL
  Filled 2016-01-17: qty 1

## 2016-01-17 MED ORDER — SODIUM CHLORIDE 0.9 % IV SOLN
INTRAVENOUS | Status: DC
  Administered 2016-01-17: via INTRAVENOUS

## 2016-01-17 MED ORDER — POVIDONE-IODINE 10 % EX SWAB
2.0000 "application " | Freq: Once | CUTANEOUS | Status: AC
  Administered 2016-01-18: 2 via TOPICAL

## 2016-01-17 MED ORDER — IBUPROFEN 200 MG PO TABS: 600.0000 mg | ORAL_TABLET | Freq: Once | ORAL | Status: DC

## 2016-01-17 MED ORDER — CHLORHEXIDINE GLUCONATE 4 % EX LIQD
60.0000 mL | Freq: Once | CUTANEOUS | Status: AC
  Administered 2016-01-18: 4 via TOPICAL
  Filled 2016-01-17: qty 60

## 2016-01-17 MED ORDER — ASPIRIN EC 81 MG PO TBEC: 81.0000 mg | DELAYED_RELEASE_TABLET | Freq: Every day | ORAL | Status: DC

## 2016-01-17 MED ORDER — KETOROLAC TROMETHAMINE 30 MG/ML IJ SOLN: 30.0000 mg | Freq: Four times a day (QID) | INTRAMUSCULAR | Status: DC | PRN

## 2016-01-17 MED ORDER — OXYCODONE HCL 5 MG PO TABS: 5.0000 mg | ORAL_TABLET | ORAL | Status: DC | PRN

## 2016-01-17 MED ORDER — OXYCODONE-ACETAMINOPHEN 5-325 MG PO TABS
1.0000 | ORAL_TABLET | Freq: Once | ORAL | Status: AC
  Administered 2016-01-17: 1 via ORAL
  Filled 2016-01-17: qty 1

## 2016-01-17 NOTE — ED Provider Notes (Signed)
[facility] DEPT Provider Note   CSN: [id number] Arrival date & time: 01/17/16  1411  By signing my name below, I, Ephriam Jenkins, attest that this documentation has been prepared under the direction and in the presence of [employer].  Electronically Signed: Ephriam Jenkins, ED Scribe. 01/17/16. 3:28 PM.   History   Chief Complaint Chief Complaint  Patient presents with  . Ankle Pain    HPI HPI Comments: Scott Hebert is a 56 y.o. male, with no pertinent Hx, who presents to the Emergency Department s/p an injury to his right ankle that occurred just prior to arrival. Pt was bringing his trash can up his driveway when his right foot stepped off of the concrete path and onto wet grass that was off of the driveway. Pt states that his right foot slipped which caused his ankle to evert. Pt heard a pop in his ankle and had sudden onset of pain. He has been unable to bear weight on the extremity. No current orthopedic doctor. No loss of sensation. No pain in the knee or foot.   The history is provided by the patient. No language interpreter was used.    Past Medical History:  Diagnosis Date  . Allergy     Patient Active Problem List   Diagnosis Date Noted  . Allergy 12/07/2011    History reviewed. No pertinent surgical history.   Home Medications    Prior to Admission medications   Medication Sig Start Date End Date Taking? Authorizing Provider  Aspirin-Salicylamide-Caffeine (BC HEADACHE POWDER PO) Take 1 packet by mouth daily as needed (pain).   Yes Historical Provider, MD  dextromethorphan-guaiFENesin (MUCINEX DM) 30-600 MG 12hr tablet Take 1 tablet by mouth 2 (two) times daily.   Yes Historical Provider, MD  ibuprofen (ADVIL,MOTRIN) 200 MG tablet Take 200 mg by mouth every 6 (six) hours as needed for mild pain or moderate pain.   Yes Historical Provider, MD   Family History Family History  Problem Relation Age of Onset  . Cancer Mother    Social History Social History    Substance Use Topics  . Smoking status: Current Every Day Smoker    Packs/day: 0.50    Types: Cigarettes  . Smokeless tobacco: Never Used     Comment: using electronic vapor cigarette to help him quit smoking.  . Alcohol use 0.0 - 2.0 oz/week     Comment: every other week     Allergies   Shellfish-derived products   Review of Systems Review of Systems  Musculoskeletal: Positive for arthralgias (right ankle).  Neurological: Negative for numbness.     Physical Exam Updated Vital Signs BP 156/100 (BP Location: Right Arm)   Pulse 102   Temp 98 F (36.7 C) (Oral)   Resp 18   SpO2 93%   Physical Exam  Constitutional: He is oriented to person, place, and time. He appears well-developed and well-nourished. No distress.  HENT:  Head: Normocephalic and atraumatic.  Eyes: Conjunctivae are normal. Pupils are equal, round, and reactive to light. Right eye exhibits no discharge. Left eye exhibits no discharge. No scleral icterus.  Neck: Normal range of motion.  Cardiovascular: Normal rate.   Pulmonary/Chest: Effort normal. No respiratory distress.  Abdominal: He exhibits no distension.  Musculoskeletal:  Right ankle: Mild-moderate amount of swelling around ankle. No obvious deformity. Significant diffuse tenderness to palpation with maximal tenderness over medial ankle. ROM deferred. FROM of toes. N/V intact.  Right calf: No tenderness   Neurological: He is alert  and oriented to person, place, and time.  Skin: Skin is warm and dry. He is not diaphoretic.  Psychiatric: He has a normal mood and affect. His behavior is normal. Judgment normal.  Nursing note and vitals reviewed.    ED Treatments / Results  DIAGNOSTIC STUDIES: Oxygen Saturation is 93% on RA, adequate by my interpretation.  COORDINATION OF CARE: 3:15 PM-Discussed treatment plan with pt at bedside and pt agreed to plan.   Labs (all labs ordered are listed, but only abnormal results are displayed) Labs  Reviewed - No data to display  EKG  EKG Interpretation None       Radiology No results found.  Procedures Procedures (including critical care time)  Medications Ordered in ED Medications  oxyCODONE-acetaminophen (PERCOCET/ROXICET) 5-325 MG per tablet 1 tablet (1 tablet Oral Given 01/17/16 1558)     Initial Impression / Assessment and Plan / ED Course  I have reviewed the triage vital signs and the nursing notes.  Pertinent labs & imaging results that were available during my care of the patient were reviewed by me and considered in my medical decision making (see chart for details).  Clinical Course    56 year old male with RIGHT distal fibula and tibia fracture with widening of ankle mortise. He is hypertensive and mildly tachy but otherwise vitals are normal. Percocet given for pain.  Spoke with Dr. Sharol Given with Ortho who recommends admission and plan for surgery tomorrow. Discussed plan with patient who is very anxious about procedure. Provided reassurance. Vitals rechecked and BP and HR were mildly improved. Patient is agreeable to plan. Will transfer to [facility].   Final Clinical Impressions(s) / ED Diagnoses   Final diagnoses:  Closed fracture of distal end of right fibula, unspecified fracture morphology, initial encounter  Closed extra-articular fracture of distal end of right tibia, initial encounter    New Prescriptions New Prescriptions   No medications on file   I personally performed the services described in this documentation, which was scribed in my presence. The recorded information has been reviewed and is accurate.     Recardo Evangelist, PA-C 01/17/16 [place], MD 01/17/16 [phone]

## 2016-01-17 NOTE — ED Triage Notes (Signed)
Pt complaint of right ankle pain and swelling post turning outward with pop sound while attempting to move trash can.

## 2016-01-17 NOTE — Progress Notes (Signed)
Patient arrive to unit from [facility] via [facility].

## 2016-01-18 ENCOUNTER — Observation Stay (HOSPITAL_COMMUNITY): Admitting: Certified Registered"

## 2016-01-18 ENCOUNTER — Encounter (HOSPITAL_COMMUNITY): Payer: Self-pay | Admitting: Anesthesiology

## 2016-01-18 ENCOUNTER — Encounter (HOSPITAL_COMMUNITY)
Admission: EM | Disposition: A | Discharge status: Home / Self Care | Payer: Self-pay | Source: Home / Self Care | Attending: Orthopedic Surgery

## 2016-01-18 DIAGNOSIS — S82831A Other fracture of upper and lower end of right fibula, initial encounter for closed fracture: Secondary | ICD-10-CM | POA: Diagnosis not present

## 2016-01-18 DIAGNOSIS — S82301A Unspecified fracture of lower end of right tibia, initial encounter for closed fracture: Secondary | ICD-10-CM | POA: Diagnosis not present

## 2016-01-18 DIAGNOSIS — F17210 Nicotine dependence, cigarettes, uncomplicated: Secondary | ICD-10-CM | POA: Diagnosis not present

## 2016-01-18 DIAGNOSIS — J449 Chronic obstructive pulmonary disease, unspecified: Secondary | ICD-10-CM | POA: Diagnosis not present

## 2016-01-18 HISTORY — PX: ORIF ANKLE FRACTURE: SHX5408

## 2016-01-18 LAB — SURGICAL PCR SCREEN
MRSA, PCR: POSITIVE — AB
STAPHYLOCOCCUS AUREUS: POSITIVE — AB

## 2016-01-18 SURGERY — OPEN REDUCTION INTERNAL FIXATION (ORIF) ANKLE FRACTURE
Anesthesia: General | Site: Ankle | Laterality: Right

## 2016-01-18 MED ORDER — FENTANYL CITRATE (PF) 100 MCG/2ML IJ SOLN
INTRAMUSCULAR | Status: AC
  Filled 2016-01-18: qty 2

## 2016-01-18 MED ORDER — MEPERIDINE HCL 25 MG/ML IJ SOLN: 6.2500 mg | INTRAMUSCULAR | Status: DC | PRN

## 2016-01-18 MED ORDER — CEFAZOLIN SODIUM-DEXTROSE 2-4 GM/100ML-% IV SOLN
2.0000 g | INTRAVENOUS | Status: AC
  Administered 2016-01-18: 2 g via INTRAVENOUS

## 2016-01-18 MED ORDER — LIDOCAINE HCL (CARDIAC) 20 MG/ML IV SOLN
INTRAVENOUS | Status: DC | PRN
  Administered 2016-01-18: 60 mg via INTRAVENOUS

## 2016-01-18 MED ORDER — HYDROMORPHONE HCL 2 MG/ML IJ SOLN
INTRAMUSCULAR | Status: AC
  Filled 2016-01-18: qty 1

## 2016-01-18 MED ORDER — PROPOFOL 10 MG/ML IV BOLUS
INTRAVENOUS | Status: DC | PRN
  Administered 2016-01-18: 200 mg via INTRAVENOUS

## 2016-01-18 MED ORDER — 0.9 % SODIUM CHLORIDE (POUR BTL) OPTIME
TOPICAL | Status: DC | PRN
  Administered 2016-01-18: 1000 mL

## 2016-01-18 MED ORDER — LIDOCAINE 2% (20 MG/ML) 5 ML SYRINGE
INTRAMUSCULAR | Status: AC
  Filled 2016-01-18: qty 5

## 2016-01-18 MED ORDER — EPHEDRINE 5 MG/ML INJ
INTRAVENOUS | Status: AC
  Filled 2016-01-18: qty 10

## 2016-01-18 MED ORDER — EPHEDRINE SULFATE 50 MG/ML IJ SOLN
INTRAMUSCULAR | Status: DC | PRN
  Administered 2016-01-18: 5 mg via INTRAVENOUS

## 2016-01-18 MED ORDER — ONDANSETRON HCL 4 MG/2ML IJ SOLN
INTRAMUSCULAR | Status: AC
  Filled 2016-01-18: qty 2

## 2016-01-18 MED ORDER — MIDAZOLAM HCL 2 MG/2ML IJ SOLN
INTRAMUSCULAR | Status: AC
  Filled 2016-01-18: qty 2

## 2016-01-18 MED ORDER — PROMETHAZINE HCL 25 MG/ML IJ SOLN: 6.2500 mg | INTRAMUSCULAR | Status: DC | PRN

## 2016-01-18 MED ORDER — BUPIVACAINE HCL (PF) 0.5 % IJ SOLN
INTRAMUSCULAR | Status: DC | PRN
  Administered 2016-01-18: 10 mL

## 2016-01-18 MED ORDER — BUPIVACAINE-EPINEPHRINE (PF) 0.5% -1:200000 IJ SOLN
INTRAMUSCULAR | Status: DC | PRN
  Administered 2016-01-18: 30 mL via PERINEURAL

## 2016-01-18 MED ORDER — ONDANSETRON HCL 4 MG/2ML IJ SOLN
INTRAMUSCULAR | Status: DC | PRN
  Administered 2016-01-18: 4 mg via INTRAVENOUS

## 2016-01-18 MED ORDER — PHENYLEPHRINE 40 MCG/ML (10ML) SYRINGE FOR IV PUSH (FOR BLOOD PRESSURE SUPPORT)
PREFILLED_SYRINGE | INTRAVENOUS | Status: AC
  Filled 2016-01-18: qty 10

## 2016-01-18 MED ORDER — PROPOFOL 10 MG/ML IV BOLUS
INTRAVENOUS | Status: AC
  Filled 2016-01-18: qty 40

## 2016-01-18 MED ORDER — FENTANYL CITRATE (PF) 100 MCG/2ML IJ SOLN
INTRAMUSCULAR | Status: DC | PRN
  Administered 2016-01-18 (×3): 25 ug via INTRAVENOUS
  Administered 2016-01-18: 50 ug via INTRAVENOUS
  Administered 2016-01-18: 25 ug via INTRAVENOUS
  Administered 2016-01-18: 50 ug via INTRAVENOUS

## 2016-01-18 MED ORDER — MIDAZOLAM HCL 2 MG/2ML IJ SOLN: 0.5000 mg | Freq: Once | INTRAMUSCULAR | Status: DC | PRN

## 2016-01-18 MED ORDER — OXYCODONE-ACETAMINOPHEN 5-325 MG PO TABS: 1.0000 | ORAL_TABLET | ORAL | 0 refills | Status: DC | PRN

## 2016-01-18 MED ORDER — MIDAZOLAM HCL 5 MG/5ML IJ SOLN
INTRAMUSCULAR | Status: DC | PRN
  Administered 2016-01-18 (×2): 1 mg via INTRAVENOUS

## 2016-01-18 MED ORDER — PHENYLEPHRINE HCL 10 MG/ML IJ SOLN
INTRAMUSCULAR | Status: DC | PRN
  Administered 2016-01-18: 40 ug via INTRAVENOUS
  Administered 2016-01-18 (×2): 80 ug via INTRAVENOUS
  Administered 2016-01-18: 40 ug via INTRAVENOUS
  Administered 2016-01-18 (×2): 80 ug via INTRAVENOUS

## 2016-01-18 MED ORDER — ASPIRIN EC 325 MG PO TBEC: 325.0000 mg | DELAYED_RELEASE_TABLET | Freq: Every day | ORAL | 0 refills | Status: AC

## 2016-01-18 MED ORDER — HYDROMORPHONE HCL 1 MG/ML IJ SOLN
0.2500 mg | INTRAMUSCULAR | Status: DC | PRN
  Administered 2016-01-18 (×4): 0.5 mg via INTRAVENOUS

## 2016-01-18 SURGICAL SUPPLY — 42 items
BANDAGE ESMARK 6X9 LF (GAUZE/BANDAGES/DRESSINGS) IMPLANT
BIT DRILL 2.5X110 QC LCP DISP (BIT) ×2 IMPLANT
BNDG COHESIVE 4X5 TAN STRL (GAUZE/BANDAGES/DRESSINGS) ×2 IMPLANT
BNDG ESMARK 6X9 LF (GAUZE/BANDAGES/DRESSINGS)
BNDG GAUZE ELAST 4 BULKY (GAUZE/BANDAGES/DRESSINGS) ×2 IMPLANT
COVER SURGICAL LIGHT HANDLE (MISCELLANEOUS) ×2 IMPLANT
DRAPE OEC MINIVIEW 54X84 (DRAPES) ×2 IMPLANT
DRAPE U-SHAPE 47X51 STRL (DRAPES) ×2 IMPLANT
DRSG ADAPTIC 3X8 NADH LF (GAUZE/BANDAGES/DRESSINGS) ×2 IMPLANT
DRSG PAD ABDOMINAL 8X10 ST (GAUZE/BANDAGES/DRESSINGS) ×2 IMPLANT
DURAPREP 26ML APPLICATOR (WOUND CARE) ×2 IMPLANT
ELECT REM PT RETURN 9FT ADLT (ELECTROSURGICAL) ×2
ELECTRODE REM PT RTRN 9FT ADLT (ELECTROSURGICAL) ×1 IMPLANT
GAUZE SPONGE 4X4 12PLY STRL (GAUZE/BANDAGES/DRESSINGS) ×2 IMPLANT
GLOVE BIOGEL PI IND STRL 9 (GLOVE) ×1 IMPLANT
GLOVE BIOGEL PI INDICATOR 9 (GLOVE) ×1
GLOVE SURG ORTHO 9.0 STRL STRW (GLOVE) ×2 IMPLANT
GOWN STRL REUS W/ TWL XL LVL3 (GOWN DISPOSABLE) ×3 IMPLANT
GOWN STRL REUS W/TWL XL LVL3 (GOWN DISPOSABLE) ×3
KIT BASIN OR (CUSTOM PROCEDURE TRAY) ×2 IMPLANT
KIT ROOM TURNOVER OR (KITS) ×2 IMPLANT
MANIFOLD NEPTUNE II (INSTRUMENTS) ×2 IMPLANT
NS IRRIG 1000ML POUR BTL (IV SOLUTION) ×2 IMPLANT
PACK ORTHO EXTREMITY (CUSTOM PROCEDURE TRAY) ×2 IMPLANT
PAD ABD 8X10 STRL (GAUZE/BANDAGES/DRESSINGS) ×2 IMPLANT
PAD ARMBOARD 7.5X6 YLW CONV (MISCELLANEOUS) ×4 IMPLANT
PLATE LCP 3.5 1/3 TUB 7HX81 (Plate) ×2 IMPLANT
SCREW CORTEX 3.5 12MM (Screw) ×4 IMPLANT
SCREW CORTEX 3.5 20MM (Screw) ×1 IMPLANT
SCREW LOCK CORT ST 3.5X12 (Screw) ×4 IMPLANT
SCREW LOCK CORT ST 3.5X20 (Screw) ×1 IMPLANT
SCREW LOCKING 3.5 (Screw) ×4 IMPLANT
SPONGE GAUZE 4X4 12PLY STER LF (GAUZE/BANDAGES/DRESSINGS) ×2 IMPLANT
STAPLER VISISTAT 35W (STAPLE) ×2 IMPLANT
SUCTION FRAZIER HANDLE 10FR (MISCELLANEOUS) ×1
SUCTION TUBE FRAZIER 10FR DISP (MISCELLANEOUS) ×1 IMPLANT
SUT ETHILON 2 0 PSLX (SUTURE) IMPLANT
SUT VIC AB 2-0 CT1 27 (SUTURE) ×1
SUT VIC AB 2-0 CT1 TAPERPNT 27 (SUTURE) ×1 IMPLANT
TOWEL OR 17X24 6PK STRL BLUE (TOWEL DISPOSABLE) ×2 IMPLANT
TOWEL OR 17X26 10 PK STRL BLUE (TOWEL DISPOSABLE) ×2 IMPLANT
TUBE CONNECTING 12X1/4 (SUCTIONS) ×2 IMPLANT

## 2016-01-18 NOTE — Anesthesia Preprocedure Evaluation (Signed)
Anesthesia Evaluation  Patient identified by MRN, date of birth, ID band Patient awake    Reviewed: Allergy & Precautions, NPO status , Patient's Chart, lab work & pertinent test results  History of Anesthesia Complications Negative for: history of anesthetic complications  Airway Mallampati: II  TM Distance: >3 FB Neck ROM: Full    Dental  (+) Poor Dentition, Missing, Chipped, Dental Advisory Given   Pulmonary COPD (last inhaler use a week ago),  COPD inhaler, Current Smoker,    breath sounds clear to auscultation       Cardiovascular negative cardio ROS   Rhythm:Regular Rate:Normal     Neuro/Psych negative neurological ROS     GI/Hepatic Neg liver ROS, GERD  Controlled,  Endo/Other  Morbid obesity  Renal/GU negative Renal ROS     Musculoskeletal   Abdominal (+) + obese,   Peds  Hematology negative hematology ROS (+)   Anesthesia Other Findings   Reproductive/Obstetrics                             Anesthesia Physical Anesthesia Plan  ASA: II  Anesthesia Plan: General   Post-op Pain Management: GA combined w/ Regional for post-op pain   Induction:   Airway Management Planned: LMA  Additional Equipment:   Intra-op Plan:   Post-operative Plan:   Informed Consent: I have reviewed the patients History and Physical, chart, labs and discussed the procedure including the risks, benefits and alternatives for the proposed anesthesia with the patient or authorized representative who has indicated his/her understanding and acceptance.   Dental advisory given  Plan Discussed with: CRNA and Surgeon  Anesthesia Plan Comments: (Plan routine monitors, GA- LMA OK, popliteal block for post op analgesia)        Anesthesia Quick Evaluation

## 2016-01-18 NOTE — Anesthesia Postprocedure Evaluation (Signed)
Anesthesia Post Note  Patient: Scott Hebert  Procedure(s) Performed: Procedure(s) (LRB): OPEN REDUCTION INTERNAL FIXATION (ORIF) ANKLE FRACTURE (Right)  Patient location during evaluation: PACU Anesthesia Type: General and Regional Level of consciousness: awake and alert, patient cooperative and oriented Pain management: pain level controlled Vital Signs Assessment: post-procedure vital signs reviewed and stable Respiratory status: spontaneous breathing, nonlabored ventilation and respiratory function stable Cardiovascular status: blood pressure returned to baseline and stable Postop Assessment: no signs of nausea or vomiting Anesthetic complications: no       Last Vitals:  Vitals:   01/18/16 0938 01/18/16 0952  BP: (!) 138/96 (!) 145/97  Pulse: 88 85  Resp: 20 13  Temp:  36.6 C    Last Pain:  Vitals:   01/18/16 0503  TempSrc: Oral  PainSc:                  [name],[name]

## 2016-01-18 NOTE — Progress Notes (Signed)
Orthopedic Tech Progress Note Patient Details:  Scott Hebert 02/21/1960 [id number]  Ortho Devices Type of Ortho Device: CAM walker Ortho Device/Splint Location: rle Ortho Device/Splint Interventions: Application   [name] 01/18/2016, 11:03 AM Pt stated that he already has crutches; RN notified

## 2016-01-18 NOTE — Op Note (Signed)
01/17/2016 - 01/18/2016  8:59 AM  PATIENT:  Scott Hebert    PRE-OPERATIVE DIAGNOSIS:  RIGHT ANKLE FRACTURE  POST-OPERATIVE DIAGNOSIS:  Same  PROCEDURE:  OPEN REDUCTION INTERNAL FIXATION (ORIF) ANKLE FRACTURE  SURGEON:  Newt Minion, MD  PHYSICIAN ASSISTANT:None ANESTHESIA:   General  PREOPERATIVE INDICATIONS:  Scott Hebert is a  56 y.o. male with a diagnosis of RIGHT ANKLE FRACTURE who failed conservative measures and elected for surgical management.    The risks benefits and alternatives were discussed with the patient preoperatively including but not limited to the risks of infection, bleeding, nerve injury, cardiopulmonary complications, the need for revision surgery, among others, and the patient was willing to proceed.  OPERATIVE IMPLANTS: 7-hole one third tubular Synthes plate  OPERATIVE FINDINGS: Postoperative radiographs show stable alignment with congruent mortise  OPERATIVE PROCEDURE: Patient brought the operating room after undergoing a popliteal block he then underwent a general LMA anesthetic. After adequate levels anesthesia obtained patient's right lower extremity was prepped using DuraPrep draped into a sterile field a timeout was called. A lateral incision was made over the fibula this was carried sharply down to bone. The fracture site was cleansed and irrigated and debrided of the hematoma. The fracture was reduced and stabilized with an interfrag screw. A one third tubular plate was applied posterior laterally is an anti-glide plate and this was secured proximally with 3 cortical screws and distally with 2 locking screws. C-arm fluoroscopy verified congruence of the mortise with restoration of the fibula. The wound was irrigated with normal saline subcutaneous is closed using 2-0 Vicryl skin was closed using staples. A sterile compressive dressing was applied patient was extubated taken to the PACU in stable condition plan for discharge to home.

## 2016-01-18 NOTE — Anesthesia Procedure Notes (Signed)
Procedure Name: LMA Insertion Date/Time: 01/18/2016 8:17 AM Performed by: Suzy Bouchard Pre-anesthesia Checklist: Patient identified, Suction available, Patient being monitored, Timeout performed and Emergency Drugs available Patient Re-evaluated:Patient Re-evaluated prior to inductionOxygen Delivery Method: Circle system utilized Preoxygenation: Pre-oxygenation with 100% oxygen Intubation Type: IV induction Ventilation: Mask ventilation without difficulty LMA: LMA inserted LMA Size: 5.0 Tube type: Oral Number of attempts: 1 Placement Confirmation: positive ETCO2 and breath sounds checked- equal and bilateral Tube secured with: Tape Dental Injury: Teeth and Oropharynx as per pre-operative assessment

## 2016-01-18 NOTE — Transfer of Care (Signed)
Immediate Anesthesia Transfer of Care Note  Patient: Scott Hebert  Procedure(s) Performed: Procedure(s): OPEN REDUCTION INTERNAL FIXATION (ORIF) ANKLE FRACTURE (Right)  Patient Location: PACU  Anesthesia Type:General with regional for post op pain  Level of Consciousness: sedated  Airway & Oxygen Therapy: Patient Spontanous Breathing and Patient connected to nasal cannula oxygen  Post-op Assessment: Report given to RN and Post -op Vital signs reviewed and stable  Post vital signs: Reviewed and stable  Last Vitals:  Vitals:   01/17/16 2208 01/18/16 0503  BP: (!) 154/94 (!) 154/96  Pulse: (!) 101 74  Resp: 16 16  Temp: 37 C 36.8 C    Last Pain:  Vitals:   01/18/16 0503  TempSrc: Oral  PainSc:          Complications: No apparent anesthesia complications

## 2016-01-18 NOTE — H&P (Signed)
Scott Hebert is an 56 y.o. male.   Chief Complaint: Right ankle pain HPI: Patient states he slipped on wet grass bring in the garbage can going up hill when he had a supination external rotation injury to his right ankle.  Past Medical History:  Diagnosis Date  . Allergy     History reviewed. No pertinent surgical history.  Family History  Problem Relation Age of Onset  . Cancer Mother    Social History:  reports that he has been smoking Cigarettes.  He has been smoking about 0.50 packs per day. He has never used smokeless tobacco. He reports that he drinks alcohol. He reports that he does not use drugs.  Allergies:  Allergies  Allergen Reactions  . Shellfish-Derived Products Anaphylaxis    Medications Prior to Admission  Medication Sig Dispense Refill  . Aspirin-Salicylamide-Caffeine (BC HEADACHE POWDER PO) Take 1 packet by mouth daily as needed (pain).    Marland Kitchen dextromethorphan-guaiFENesin (MUCINEX DM) 30-600 MG 12hr tablet Take 1 tablet by mouth 2 (two) times daily.    Marland Kitchen ibuprofen (ADVIL,MOTRIN) 200 MG tablet Take 200 mg by mouth every 6 (six) hours as needed for mild pain or moderate pain.      Results for orders placed or performed during the hospital encounter of 01/17/16 (from the past 48 hour(s))  Comprehensive metabolic panel     Status: Abnormal   Collection Time: 01/17/16  8:22 PM  Result Value Ref Range   Sodium 139 135 - 145 mmol/L   Potassium 4.0 3.5 - 5.1 mmol/L   Chloride 104 101 - 111 mmol/L   CO2 25 22 - 32 mmol/L   Glucose, Bld 128 (H) 65 - 99 mg/dL   BUN 15 6 - 20 mg/dL   Creatinine, Ser 0.89 0.61 - 1.24 mg/dL   Calcium 9.3 8.9 - 10.3 mg/dL   Total Protein 8.4 (H) 6.5 - 8.1 g/dL   Albumin 4.6 3.5 - 5.0 g/dL   AST 24 15 - 41 U/L   ALT 33 17 - 63 U/L   Alkaline Phosphatase 62 38 - 126 U/L   Total Bilirubin 0.6 0.3 - 1.2 mg/dL   GFR calc non Af Amer >60 >60 mL/min   GFR calc Af Amer >60 >60 mL/min    Comment: (NOTE) The eGFR has been calculated using  the CKD EPI equation. This calculation has not been validated in all clinical situations. eGFR's persistently <60 mL/min signify possible Chronic Kidney Disease.    Anion gap 10 5 - 15  CBC     Status: Abnormal   Collection Time: 01/17/16  8:22 PM  Result Value Ref Range   WBC 9.2 4.0 - 10.5 K/uL   RBC 5.15 4.22 - 5.81 MIL/uL   Hemoglobin 14.2 13.0 - 17.0 g/dL   HCT 43.7 39.0 - 52.0 %   MCV 84.9 78.0 - 100.0 fL   MCH 27.6 26.0 - 34.0 pg   MCHC 32.5 30.0 - 36.0 g/dL   RDW 15.7 (H) 11.5 - 15.5 %   Platelets 191 150 - 400 K/uL    Comment: SPECIMEN CHECKED FOR CLOTS REPEATED TO VERIFY PLATELET COUNT CONFIRMED BY SMEAR LARGE PLATELETS PRESENT    Dg Ankle Complete Right  Result Date: 01/17/2016 CLINICAL DATA:  Pain after trauma. EXAM: RIGHT ANKLE - COMPLETE 3+ VIEW COMPARISON:  None. FINDINGS: There is a displaced fracture through the distal fibula. There may be a subtle fracture through the lateral aspect of the tibia as seen on oblique imaging. Calcifications  are seen adjacent to the medial malleolus is well. There is significant soft tissue swelling, particularly medially. There is widening of the ankle mortise medially as well. On the lateral view, the tibia is displaced anteriorly relative to the calcaneus. No other acute abnormalities identified. IMPRESSION: 1. Distal left fibular fracture. 2. Suspected fracture through the lateral aspect of the distal tibia on oblique imaging only. 3. Disruption of the ankle mortise with widening medially and anteriorly. The tibia is mildly displaced anteriorly relative to the talus. 4. Calcifications adjacent to the medial malleolus. While some of these may be chronic, acute fracture fragments are not completely excluded. Soft tissue swelling medially. Electronically Signed   By: Dorise Bullion III M.D   On: 01/17/2016 14:53    Review of Systems  All other systems reviewed and are negative.   Blood pressure (!) 154/96, pulse 74, temperature 98.2 F  (36.8 C), temperature source Oral, resp. rate 16, SpO2 99 %. Physical Exam  On examination patient is alert oriented no adenopathy well-dressed normal affect normal rest or after he does have slight hypertension. His foot is neurovascularly intact capillary refill less than 2 seconds. The skin is intact there are no blisters. Radiographs shows arthritic changes around the ankle with a displaced Weber B fibular fracture. Assessment/Plan Assessment: Right ankle displaced Weber B fibular fracture.  Plan: We'll plan for open reduction internal fixation of the fibula. Risk and benefits were discussed including infection neurovascular injury DVT nonhealing the wound need for additional surgery. Patient states he understands wishes to proceed at this time. The importance of smoking cessation was discussed. Patient states he elected to home after surgery.  Newt Minion, MD 01/18/2016, 7:31 AM

## 2016-01-18 NOTE — Anesthesia Procedure Notes (Signed)
Anesthesia Regional Block:  Popliteal block  Pre-Anesthetic Checklist: ,, timeout performed, Correct Patient, Correct Site, Correct Laterality, Correct Procedure, Correct Position, site marked, Risks and benefits discussed,  Surgical consent,  Pre-op evaluation,  At surgeon's request and post-op pain management  Laterality: Right and Lower  Prep: chloraprep       Needles:  Injection technique: Single-shot  Needle Type: Stimulator Needle - 80     Needle Length: 9cm 9 cm Needle Gauge: 21 and 21 G    Additional Needles:  Procedures: nerve stimulator Popliteal block  Nerve Stimulator or Paresthesia:  Response: toe dorsiflexion, 0.4 mA, 0.1 ms,  Response: toe abduction, 0.4 mA, 0.1 ms,   Additional Responses:   Narrative:  Start time: 01/18/2016 7:52 AM End time: 01/18/2016 8:02 AM Injection made incrementally with aspirations every 5 mL.  Performed by: Personally  Anesthesiologist: Glennon Mac, Clarinda Obi  Additional Notes: Pt identified in Holding room.  Monitors applied. Working IV access confirmed. Sterile prep R lateral knee.  #21ga PNS to toe abduction and dorsiflexion twitches at 0.58mA threshold.  30cc 0.5% Bupivacaine with 1:200k epi injected incrementally after negative test dose.  Re-prep R prox, ant-med tibia, 10 cc 0.5% Bupivacaine infiltration for supplementation of saph nerve. Patient asymptomatic, VSS, no heme aspirated, tolerated well.  Jenita Seashore, MD

## 2016-01-19 NOTE — Discharge Summary (Signed)
Discharge Diagnoses:  Active Problems:   Closed fracture of distal end of fibula, unspecified fracture morphology, initial encounter   Closed right ankle fracture   Closed extra-articular fracture of distal end of right tibia   Surgeries: Procedure(s): OPEN REDUCTION INTERNAL FIXATION (ORIF) ANKLE FRACTURE on 01/17/2016 - 01/18/2016    Consultants: none  Discharged Condition: Improved  Hospital Course: Scott Hebert is an 56 y.o. male who was admitted 01/17/2016 with a chief complaint of right ankle fracture, with a final diagnosis of RIGHT ANKLE FRACTURE.  Patient was brought to the operating room on 01/17/2016 - 01/18/2016 and underwent Procedure(s): OPEN REDUCTION INTERNAL FIXATION (ORIF) ANKLE FRACTURE.    Patient was given perioperative antibiotics:  Anti-infectives    Start     Dose/Rate Route Frequency Ordered Stop   01/18/16 0600  ceFAZolin (ANCEF) IVPB 2g/100 mL premix  Status:  Discontinued     2 g 200 mL/hr over 30 Minutes Intravenous To Surgery 01/17/16 2211 01/18/16 0000   01/18/16 0600  ceFAZolin (ANCEF) IVPB 2g/100 mL premix     2 g 200 mL/hr over 30 Minutes Intravenous To Short Stay 01/18/16 0000 01/18/16 0820    .  Patient was given sequential compression devices, early ambulation, and aspirin for DVT prophylaxis.  Recent vital signs: No data found. .  Recent laboratory studies: No results found.  Discharge Medications:   Allergies as of 01/18/2016      Reactions   Shellfish-derived Products Anaphylaxis      Medication List    TAKE these medications   aspirin EC 325 MG tablet Take 1 tablet (325 mg total) by mouth daily.   BC HEADACHE POWDER PO Take 1 packet by mouth daily as needed (pain).   dextromethorphan-guaiFENesin 30-600 MG 12hr tablet Commonly known as:  MUCINEX DM Take 1 tablet by mouth 2 (two) times daily.   ibuprofen 200 MG tablet Commonly known as:  ADVIL,MOTRIN Take 200 mg by mouth every 6 (six) hours as needed for mild pain or moderate  pain.   oxyCODONE-acetaminophen 5-325 MG tablet Commonly known as:  ROXICET Take 1 tablet by mouth every 4 (four) hours as needed for severe pain.            Durable Medical Equipment        Start     Ordered   01/18/16 0000  For home use only DME Crutches     01/18/16 0859      Diagnostic Studies: Dg Ankle Complete Right  Result Date: 01/17/2016 CLINICAL DATA:  Pain after trauma. EXAM: RIGHT ANKLE - COMPLETE 3+ VIEW COMPARISON:  None. FINDINGS: There is a displaced fracture through the distal fibula. There may be a subtle fracture through the lateral aspect of the tibia as seen on oblique imaging. Calcifications are seen adjacent to the medial malleolus is well. There is significant soft tissue swelling, particularly medially. There is widening of the ankle mortise medially as well. On the lateral view, the tibia is displaced anteriorly relative to the calcaneus. No other acute abnormalities identified. IMPRESSION: 1. Distal left fibular fracture. 2. Suspected fracture through the lateral aspect of the distal tibia on oblique imaging only. 3. Disruption of the ankle mortise with widening medially and anteriorly. The tibia is mildly displaced anteriorly relative to the talus. 4. Calcifications adjacent to the medial malleolus. While some of these may be chronic, acute fracture fragments are not completely excluded. Soft tissue swelling medially. Electronically Signed   By: Dorise Bullion III M.D   On:  01/17/2016 14:53    Patient benefited maximally from their hospital stay and there were no complications.     Disposition: 01-Home or Self Care Discharge Instructions    Apply cam walker    Complete by:  As directed    Laterality:  Right   Call MD / Call 911    Complete by:  As directed    If you experience chest pain or shortness of breath, CALL 911 and be transported to the hospital emergency room.  If you develope a fever above 101 F, pus (white drainage) or increased drainage or  redness at the wound, or calf pain, call your surgeon's office.   Constipation Prevention    Complete by:  As directed    Drink plenty of fluids.  Prune juice may be helpful.  You may use a stool softener, such as Colace (over the counter) 100 mg twice a day.  Use MiraLax (over the counter) for constipation as needed.   Diet - low sodium heart healthy    Complete by:  As directed    Elevate operative extremity    Complete by:  As directed    For home use only DME Crutches    Complete by:  As directed    Ice pack    Complete by:  As directed    Increase activity slowly as tolerated    Complete by:  As directed    Non weight bearing    Complete by:  As directed    Laterality:  right   Extremity:  Lower     Follow-up Information    Newt Minion, MD In 2 weeks.   Specialty:  Orthopedic Surgery Contact information: [place] [address] [phone]            Signed: Newt Minion 01/19/2016, 5:22 PM

## 2016-01-21 ENCOUNTER — Encounter (HOSPITAL_COMMUNITY): Payer: Self-pay | Admitting: Orthopedic Surgery

## 2016-01-30 ENCOUNTER — Inpatient Hospital Stay (INDEPENDENT_AMBULATORY_CARE_PROVIDER_SITE_OTHER): Admitting: Orthopedic Surgery

## 2016-02-02 ENCOUNTER — Ambulatory Visit (INDEPENDENT_AMBULATORY_CARE_PROVIDER_SITE_OTHER): Payer: BLUE CROSS/BLUE SHIELD

## 2016-02-02 ENCOUNTER — Ambulatory Visit (INDEPENDENT_AMBULATORY_CARE_PROVIDER_SITE_OTHER): Payer: Self-pay | Admitting: Orthopedic Surgery

## 2016-02-02 DIAGNOSIS — M25571 Pain in right ankle and joints of right foot: Secondary | ICD-10-CM | POA: Diagnosis not present

## 2016-02-02 DIAGNOSIS — S82891D Other fracture of right lower leg, subsequent encounter for closed fracture with routine healing: Secondary | ICD-10-CM

## 2016-02-02 MED ORDER — OXYCODONE-ACETAMINOPHEN 5-325 MG PO TABS: 1.0000 | ORAL_TABLET | ORAL | 0 refills | Status: DC | PRN

## 2016-02-02 NOTE — Addendum Note (Signed)
Addended by: Meridee Score on: 02/02/2016 03:15 PM   Modules accepted: Orders

## 2016-02-02 NOTE — Progress Notes (Signed)
Office Visit Note   Patient: Scott Hebert           Date of Birth: [birth year]           MRN: [id number] Visit Date: 02/02/2016              Requested by: No referring provider defined for this encounter. PCP: [name] Patient  Chief Complaint  Patient presents with  . Right Ankle - Routine Post Op    01/18/16 s/p ORIF right ankle ankle fracture.     HPI: Patient is non weight bearing in a wheelchair. The staples are intact but the foot and ankle are very swollen and the incision is pulling. He states that he is trying to elevate while at home but is not successful getting it above his heart. The pt states that he is in a lot of pain.  Pamella Pert, RMA    Assessment & Plan: Visit Diagnoses:  1. Pain in right ankle and joints of right foot   2. Closed fracture of right ankle with routine healing, subsequent encounter     Plan: We will continue with elevation we'll harvest the staples apply Steri-Strips patient is given a prescription to go to [facility] to obtain medical compression stockings the high that he will wear continuously other than showering. He is to work on range of motion of his ankle nonweightbearing he is to use his fracture boot he states he did not feel comfortable in the fracture boot initially.  Three-view radiographs of the right ankle at follow-up at which time anticipate we can begin full weightbearing.  Follow-Up Instructions: Return in about 3 weeks (around 02/23/2016).   Ortho Exam On examination patient does have swelling he states he's been keeping his leg dependent. The wound edges are well approximated is good range of motion of the ankle which is asymptomatic. There is no redness no synovitis no drainage no signs of infection.  Imaging: Xr Ankle Complete Right  Result Date: 02/02/2016 Three-view radiographs the right ankle shows a congruent mortise the fracture is well aligned hardware complications the fibula is out to  length.   Orders:  Orders Placed This Encounter  Procedures  . XR Ankle Complete Right   No orders of the defined types were placed in this encounter.    Procedures: No procedures performed  Clinical Data: No additional findings.  Subjective: Review of Systems  Objective: Vital Signs: There were no vitals taken for this visit.  Specialty Comments:  No specialty comments available.  PMFS History: Patient Active Problem List   Diagnosis Date Noted  . Closed extra-articular fracture of distal end of right tibia   . Closed fracture of distal end of fibula, unspecified fracture morphology, initial encounter 01/17/2016  . Closed right ankle fracture 01/17/2016  . Allergy 12/07/2011   Past Medical History:  Diagnosis Date  . Allergy     Family History  Problem Relation Age of Onset  . Cancer Mother     Past Surgical History:  Procedure Laterality Date  . ORIF ANKLE FRACTURE Right 01/18/2016   Procedure: OPEN REDUCTION INTERNAL FIXATION (ORIF) ANKLE FRACTURE;  Surgeon: Newt Minion, MD;  Location: [place];  Service: Orthopedics;  Laterality: Right;   Social History   Occupational History  . [employer]   Social History Main Topics  . Smoking status: Current Every Day Smoker    Packs/day: 0.50    Types: Cigarettes  . Smokeless tobacco: Never  Used     Comment: using electronic vapor cigarette to help him quit smoking.  . Alcohol use 0.0 - 2.0 oz/week     Comment: every other week  . Drug use: No  . Sexual activity: Yes    Partners: Female    Birth control/ protection: None

## 2016-02-11 ENCOUNTER — Telehealth (INDEPENDENT_AMBULATORY_CARE_PROVIDER_SITE_OTHER): Payer: Self-pay | Admitting: Radiology

## 2016-02-11 NOTE — Telephone Encounter (Signed)
Called and lm on vm to advise that work in appt made for this pt tomorrow at 9:45. We need to see him in the office to evaluate why he is having increased pain. To call back and confirm that he got this message.

## 2016-02-11 NOTE — Telephone Encounter (Signed)
Patient is calling states that he is in severe pain, that he can't get out of bed. He wants to know what he can do or if he needs to be seen.  He is post op ORIF of the Right ankle on 02/02/2016. Please call him to advise.

## 2016-02-12 ENCOUNTER — Ambulatory Visit (INDEPENDENT_AMBULATORY_CARE_PROVIDER_SITE_OTHER): Payer: BLUE CROSS/BLUE SHIELD

## 2016-02-12 ENCOUNTER — Ambulatory Visit (INDEPENDENT_AMBULATORY_CARE_PROVIDER_SITE_OTHER): Admitting: Orthopedic Surgery

## 2016-02-12 ENCOUNTER — Ambulatory Visit (INDEPENDENT_AMBULATORY_CARE_PROVIDER_SITE_OTHER): Payer: Self-pay | Admitting: Orthopedic Surgery

## 2016-02-12 DIAGNOSIS — M25571 Pain in right ankle and joints of right foot: Secondary | ICD-10-CM

## 2016-02-12 DIAGNOSIS — S82891D Other fracture of right lower leg, subsequent encounter for closed fracture with routine healing: Secondary | ICD-10-CM

## 2016-02-12 MED ORDER — DOXYCYCLINE HYCLATE 100 MG PO TABS: 100.0000 mg | ORAL_TABLET | Freq: Two times a day (BID) | ORAL | 0 refills | Status: DC

## 2016-02-12 MED ORDER — OXYCODONE-ACETAMINOPHEN 5-325 MG PO TABS: 1.0000 | ORAL_TABLET | ORAL | 0 refills | Status: DC | PRN

## 2016-02-12 MED ORDER — MUPIROCIN 2 % EX OINT: 1.0000 "application " | TOPICAL_OINTMENT | Freq: Two times a day (BID) | CUTANEOUS | 3 refills | Status: DC

## 2016-02-12 NOTE — Progress Notes (Signed)
Office Visit Note   Patient: Scott Hebert           Date of Birth: 11-May-1960           MRN: [id number] Visit Date: 02/12/2016              Requested by: No referring provider defined for this encounter. PCP: [name] Patient  Chief Complaint  Patient presents with  . Right Ankle - Routine Post Op    01/18/16 s/p ORIF right ankle ankle fracture.     HPI: Pt is 3 weeks out from an ORIF right ankle fx. He is in the office today weight bearing without the fracture boot. He states that the boot causes him pain. The ankle is swollen and the skin was blistered under the steri strips. They ruptured and the skin is peeling. The incision is slightly open and draining a small amount of  serosanguinous drainage. Pamella Pert, RMA    Assessment & Plan: Visit Diagnoses:  1. Pain in right ankle and joints of right foot   2. Ankle fracture, right, closed, with routine healing, subsequent encounter     Plan: With patient's increased pain and drainage I am concerned for possible infection we will send in a prescription for Bactroban and doxycycline follow-up next week if he is still symptomatic we will plan for surgery for removal of deep retained hardware.  Follow-Up Instructions: Return in about 1 week (around 02/19/2016).   Ortho Exam On examination patient does have tenderness to palpation there is some clear drainage the wound edges are well approximated. There is no fluctuance no signs of abscess. He has good range of motion of his ankle which is asymptomatic. Imaging: Xr Ankle Complete Right  Result Date: 02/12/2016 Three-view radiographs of the right ankle show stable alignment internal fixation no loss of adduction the mortise is congruent.   Orders:  Orders Placed This Encounter  Procedures  . XR Ankle Complete Right   Meds ordered this encounter  Medications  . oxyCODONE-acetaminophen (ROXICET) 5-325 MG tablet    Sig: Take 1 tablet by mouth every 4 (four) hours as needed  for severe pain.    Dispense:  30 tablet    Refill:  0  . doxycycline (VIBRA-TABS) 100 MG tablet    Sig: Take 1 tablet (100 mg total) by mouth 2 (two) times daily.    Dispense:  60 tablet    Refill:  0  . mupirocin ointment (BACTROBAN) 2 %    Sig: Apply 1 application topically 2 (two) times daily. Apply to the affected area 2 times a day    Dispense:  22 g    Refill:  3     Procedures: No procedures performed  Clinical Data: No additional findings.  Subjective: Review of Systems  Objective: Vital Signs: There were no vitals taken for this visit.  Specialty Comments:  No specialty comments available.  PMFS History: Patient Active Problem List   Diagnosis Date Noted  . Closed extra-articular fracture of distal end of right tibia   . Closed fracture of distal end of fibula, unspecified fracture morphology, initial encounter 01/17/2016  . Ankle fracture, right, closed, with routine healing, subsequent encounter 01/17/2016  . Allergy 12/07/2011   Past Medical History:  Diagnosis Date  . Allergy     Family History  Problem Relation Age of Onset  . Cancer Mother     Past Surgical History:  Procedure Laterality Date  . ORIF ANKLE FRACTURE Right  01/18/2016   Procedure: OPEN REDUCTION INTERNAL FIXATION (ORIF) ANKLE FRACTURE;  Surgeon: Newt Minion, MD;  Location: [place];  Service: Orthopedics;  Laterality: Right;   Social History   Occupational History  . [employer]   Social History Main Topics  . Smoking status: Current Every Day Smoker    Packs/day: 0.50    Types: Cigarettes  . Smokeless tobacco: Never Used     Comment: using electronic vapor cigarette to help him quit smoking.  . Alcohol use 0.0 - 2.0 oz/week     Comment: every other week  . Drug use: No  . Sexual activity: Yes    Partners: Female    Birth control/ protection: None

## 2016-02-19 ENCOUNTER — Encounter (INDEPENDENT_AMBULATORY_CARE_PROVIDER_SITE_OTHER): Payer: Self-pay

## 2016-02-19 ENCOUNTER — Ambulatory Visit (INDEPENDENT_AMBULATORY_CARE_PROVIDER_SITE_OTHER): Payer: Self-pay | Admitting: Family

## 2016-02-19 DIAGNOSIS — S82841D Displaced bimalleolar fracture of right lower leg, subsequent encounter for closed fracture with routine healing: Secondary | ICD-10-CM

## 2016-02-20 NOTE — Progress Notes (Signed)
   Office Visit Note   Patient: Scott Hebert           Date of Birth: [birth year]           MRN: [id number] Visit Date: 02/19/2016              Requested by: No referring provider defined for this encounter. PCP: [name] Patient  Chief Complaint  Patient presents with  . Right Ankle - Routine Post Op    OPEN REDUCTION INTERNAL FIXATION (ORIF) ANKLE FRACTURE 01/18/16 ~1 month post op    HPI: The patient is a 56 year old gentleman who presents today just over 4 weeks status post open reduction internal fixation of a bimalleolar ankle fracture on the right.  feels him much improvement in the wound. Is feeling better overall. Denies ankle pain with ambulation in Cam Walker.    Assessment & Plan: Visit Diagnoses: No diagnosis found.   Plan: continue with current wound care. He will continue the fracture boot. Follow-up in the office in 2 more weeks. Anticipate advancing weightbearing out of the Cam Walker at that time. Discussed return precautions, any signs of infection.  Follow-Up Instructions: No Follow-up on file.   Ortho Exam Him right ankle incision is healing well. There is just a 7 mm length that has yet to heal. This has no depth. Filled in with granulation tissue. No drainage no surrounding erythema no cellulitis only moderate swelling to the ankle.  Imaging: No results found.  Orders:  No orders of the defined types were placed in this encounter.  No orders of the defined types were placed in this encounter.    Procedures: No procedures performed  Clinical Data: No additional findings.  Subjective: Review of Systems  Constitutional: Negative for chills and fever.    Objective: Vital Signs: There were no vitals taken for this visit.  Specialty Comments:  No specialty comments available.  PMFS History: Patient Active Problem List   Diagnosis Date Noted  . Closed extra-articular fracture of distal end of right tibia   . Closed fracture of distal end of  fibula, unspecified fracture morphology, initial encounter 01/17/2016  . Ankle fracture, right, closed, with routine healing, subsequent encounter 01/17/2016  . Allergy 12/07/2011   Past Medical History:  Diagnosis Date  . Allergy     Family History  Problem Relation Age of Onset  . Cancer Mother     Past Surgical History:  Procedure Laterality Date  . ORIF ANKLE FRACTURE Right 01/18/2016   Procedure: OPEN REDUCTION INTERNAL FIXATION (ORIF) ANKLE FRACTURE;  Surgeon: Newt Minion, MD;  Location: [facility];  Service: Orthopedics;  Laterality: Right;   Social History   Occupational History  . [employer]   Social History Main Topics  . Smoking status: Current Every Day Smoker    Packs/day: 0.50    Types: Cigarettes  . Smokeless tobacco: Never Used     Comment: using electronic vapor cigarette to help him quit smoking.  . Alcohol use 0.0 - 2.0 oz/week     Comment: every other week  . Drug use: No  . Sexual activity: Yes    Partners: Female    Birth control/ protection: None

## 2016-02-23 ENCOUNTER — Ambulatory Visit (INDEPENDENT_AMBULATORY_CARE_PROVIDER_SITE_OTHER): Admitting: Orthopedic Surgery

## 2016-03-04 ENCOUNTER — Ambulatory Visit (INDEPENDENT_AMBULATORY_CARE_PROVIDER_SITE_OTHER): Payer: Self-pay

## 2016-03-04 ENCOUNTER — Ambulatory Visit (INDEPENDENT_AMBULATORY_CARE_PROVIDER_SITE_OTHER): Payer: BLUE CROSS/BLUE SHIELD | Admitting: Orthopedic Surgery

## 2016-03-04 DIAGNOSIS — S82301D Unspecified fracture of lower end of right tibia, subsequent encounter for closed fracture with routine healing: Secondary | ICD-10-CM

## 2016-03-04 DIAGNOSIS — S82839A Other fracture of upper and lower end of unspecified fibula, initial encounter for closed fracture: Secondary | ICD-10-CM | POA: Diagnosis not present

## 2016-03-04 MED ORDER — HYDROCODONE-ACETAMINOPHEN 5-325 MG PO TABS: 1.0000 | ORAL_TABLET | Freq: Four times a day (QID) | ORAL | 0 refills | Status: DC | PRN

## 2016-03-04 NOTE — Progress Notes (Signed)
   Office Visit Note   Patient: Scott Hebert           Date of Birth: [birth year]           MRN: [id number] Visit Date: 03/04/2016              Requested by: No referring provider defined for this encounter. PCP: [name] Patient  No chief complaint on file.   HPI: The patient is a 56 year old gentleman who is 6 weeks status post right ankle fracture. Has been full weightbearing in a cam boot for the last 2 weeks. States he continues to have twinges of pain at night. Continued swelling. Complains of aching pain in the boot. Is concerned that it would be too painful to come out of the boot today into regular shoewear.    Assessment & Plan: Visit Diagnoses:  1. Closed extra-articular fracture of distal end of right tibia with routine healing, subsequent encounter   2. Closed fracture of distal end of fibula, unspecified fracture morphology, initial encounter     Plan: We'll continue the CAM walker for 2 more weeks. Follow-up in 2 weeks. Hope to advance to regular shoe wear at that time. We will reevaluate return to work at that time.  Follow-Up Instructions: Return in about 2 weeks (around 03/18/2016).   Ortho Exam Incisions are well healed. No open areas. No drainage or erythema. There is moderate swelling.   Imaging: Xr Ankle Complete Right  Result Date: 03/04/2016 Radiographs of the right ankle show stable alignment of hardware. Interval callus formation at fibular fracture.    Orders:  Orders Placed This Encounter  Procedures  . XR Ankle Complete Right   No orders of the defined types were placed in this encounter.    Procedures: No procedures performed  Clinical Data: No additional findings.  Subjective: Review of Systems  Constitutional: Negative for chills and fever.  Musculoskeletal: Positive for arthralgias and joint swelling.    Objective: Vital Signs: There were no vitals taken for this visit.  Specialty Comments:  No specialty comments  available.  PMFS History: Patient Active Problem List   Diagnosis Date Noted  . Closed extra-articular fracture of distal end of right tibia   . Closed fracture of distal end of fibula, unspecified fracture morphology, initial encounter 01/17/2016  . Allergy 12/07/2011   Past Medical History:  Diagnosis Date  . Allergy     Family History  Problem Relation Age of Onset  . Cancer Mother     Past Surgical History:  Procedure Laterality Date  . ORIF ANKLE FRACTURE Right 01/18/2016   Procedure: OPEN REDUCTION INTERNAL FIXATION (ORIF) ANKLE FRACTURE;  Surgeon: Newt Minion, MD;  Location: [facility];  Service: Orthopedics;  Laterality: Right;   Social History   Occupational History  . [employer]   Social History Main Topics  . Smoking status: Current Every Day Smoker    Packs/day: 0.50    Types: Cigarettes  . Smokeless tobacco: Never Used     Comment: using electronic vapor cigarette to help him quit smoking.  . Alcohol use 0.0 - 2.0 oz/week     Comment: every other week  . Drug use: No  . Sexual activity: Yes    Partners: Female    Birth control/ protection: None

## 2016-03-18 ENCOUNTER — Encounter (INDEPENDENT_AMBULATORY_CARE_PROVIDER_SITE_OTHER): Payer: Self-pay | Admitting: Orthopedic Surgery

## 2016-03-18 ENCOUNTER — Ambulatory Visit (INDEPENDENT_AMBULATORY_CARE_PROVIDER_SITE_OTHER): Payer: BLUE CROSS/BLUE SHIELD | Admitting: Orthopedic Surgery

## 2016-03-18 ENCOUNTER — Encounter (HOSPITAL_COMMUNITY): Payer: Self-pay | Admitting: *Deleted

## 2016-03-18 VITALS — Ht 67.0 in | Wt 233.0 lb

## 2016-03-18 DIAGNOSIS — T847XXS Infection and inflammatory reaction due to other internal orthopedic prosthetic devices, implants and grafts, sequela: Secondary | ICD-10-CM

## 2016-03-18 MED ORDER — DOXYCYCLINE HYCLATE 100 MG PO TABS: 100.0000 mg | ORAL_TABLET | Freq: Two times a day (BID) | ORAL | 0 refills | Status: DC

## 2016-03-18 NOTE — Progress Notes (Signed)
Office Visit Note   Patient: Scott Hebert           Date of Birth: Nov 02, 1960           MRN: [id number] Visit Date: 03/18/2016              Requested by: No referring provider defined for this encounter. PCP: [name] Patient  Chief Complaint  Patient presents with  . Right Foot - Follow-up    8 weeks out ankle fracture.     HPI: Right ankle follow up 8 weeks out from ankle fracture.  He does have a significant amount of swelling and states that he does have a compression sock but he is not wearing this today. He is full weight bearing in a fracture boot. He does have some tenderness to the ankle but states that is is less than it has been. He does have two small openings along the incision that are weeping a small amount of yellow drain. There is some maceration to the incision and he is using abx ointment and band aids to cover. There is 100% fibrinous tissue. Pamella Pert, RMA    Assessment & Plan: Visit Diagnoses:  1. 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 Patient is still smoking discussed the importance of smoking cessation. Discussed risk of additional surgery with removal of the ankle bone. Patient will need to be nonweightbearing on the right lower extremity. He is given a note to be out of work for 3 additional weeks. He may return to seated work but no standing no walking.  Follow-Up Instructions: Return in about 2 weeks (around 04/01/2016).   Ortho Exam Examination patient's ankle is tender to palpation there is a small amount of drainage with 2 pinpoint wounds. ROS: Negative for fever or chills. Imaging: No results found.  Labs: Lab Results  Component Value Date   GRAMSTAIN No WBC Seen 06/08/2012   GRAMSTAIN No Squamous Epithelial Cells Seen 06/08/2012   GRAMSTAIN No Organisms Seen 06/08/2012   LABORGA METHICILLIN RESISTANT STAPHYLOCOCCUS AUREUS  06/08/2012    Orders:  No orders of the defined types were placed in this encounter.  Meds ordered this encounter  Medications  . doxycycline (VIBRA-TABS) 100 MG tablet    Sig: Take 1 tablet (100 mg total) by mouth 2 (two) times daily.    Dispense:  60 tablet    Refill:  0     Procedures: No procedures performed  Clinical Data: No additional findings.  Subjective: Review of Systems  Objective: Vital Signs: Ht 5\' 7"  (1.702 m)   Wt 233 lb (105.7 kg)   BMI 36.49 kg/m   Specialty Comments:  No specialty comments available.  PMFS History: Patient Active Problem List   Diagnosis Date Noted  . 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 Closed extra-articular fracture of distal end of right tibia   . Closed fracture of distal end of fibula, unspecified fracture morphology, initial encounter 01/17/2016  . Allergy 12/07/2011   Past Medical History:  Diagnosis Date  . Allergy     Family History  Problem Relation Age of Onset  . Cancer Mother     Past Surgical History:  Procedure Laterality Date  . ORIF ANKLE FRACTURE Right 01/18/2016   Procedure: OPEN REDUCTION INTERNAL FIXATION (ORIF) ANKLE FRACTURE;  Surgeon: Newt Minion, MD;  Location: [facility];  Service:  Orthopedics;  Laterality: Right;   Social History   Occupational History  . [employer]   Social History Main Topics  . Smoking status: Current Every Day Smoker    Packs/day: 0.50    Types: Cigarettes  . Smokeless tobacco: Never Used     Comment: using electronic vapor cigarette to help him quit smoking.  . Alcohol use 0.0 - 2.0 oz/week     Comment: every other week  . Drug use: No  . Sexual activity: Yes    Partners: Female    Birth control/ protection: None

## 2016-03-18 NOTE — Progress Notes (Signed)
Pt denies SOB, chest pain, and being under the care of a cardiologist. Pt denies having a stress test, echo and cardiac cath. Pt denies having a chest x ray within the last year. Pt made aware to stop taking vitamins, fish oil herbal medications. Do not take any NSAIDs ie: Ibuprofen, Advil, Naproxen, BC and Goody Powder or any medication containing Aspirin. Pt verbalized understanding of all pre-op instructions.

## 2016-03-19 ENCOUNTER — Ambulatory Visit (HOSPITAL_COMMUNITY): Payer: BLUE CROSS/BLUE SHIELD | Admitting: Anesthesiology

## 2016-03-19 ENCOUNTER — Encounter (HOSPITAL_COMMUNITY): Payer: Self-pay | Admitting: *Deleted

## 2016-03-19 ENCOUNTER — Ambulatory Visit (HOSPITAL_COMMUNITY)
Admission: RE | Admit: 2016-03-19 | Discharge: 2016-03-19 | Disposition: A | Discharge status: Home / Self Care | Payer: BLUE CROSS/BLUE SHIELD | Source: Ambulatory Visit | Attending: Orthopedic Surgery | Admitting: Orthopedic Surgery

## 2016-03-19 ENCOUNTER — Encounter (HOSPITAL_COMMUNITY)
Admission: RE | Disposition: A | Discharge status: Home / Self Care | Payer: Self-pay | Source: Ambulatory Visit | Attending: Orthopedic Surgery

## 2016-03-19 ENCOUNTER — Other Ambulatory Visit (INDEPENDENT_AMBULATORY_CARE_PROVIDER_SITE_OTHER): Payer: Self-pay | Admitting: Family

## 2016-03-19 DIAGNOSIS — T847XXS Infection and inflammatory reaction due to other internal orthopedic prosthetic devices, implants and grafts, sequela: Secondary | ICD-10-CM

## 2016-03-19 DIAGNOSIS — T84625A Infection and inflammatory reaction due to internal fixation device of left fibula, initial encounter: Secondary | ICD-10-CM | POA: Diagnosis not present

## 2016-03-19 DIAGNOSIS — J449 Chronic obstructive pulmonary disease, unspecified: Secondary | ICD-10-CM | POA: Diagnosis not present

## 2016-03-19 DIAGNOSIS — Z6836 Body mass index (BMI) 36.0-36.9, adult: Secondary | ICD-10-CM | POA: Insufficient documentation

## 2016-03-19 DIAGNOSIS — Y831 Surgical operation with implant of artificial internal device as the cause of abnormal reaction of the patient, or of later complication, without mention of misadventure at the time of the procedure: Secondary | ICD-10-CM | POA: Diagnosis not present

## 2016-03-19 DIAGNOSIS — Z7982 Long term (current) use of aspirin: Secondary | ICD-10-CM | POA: Diagnosis not present

## 2016-03-19 DIAGNOSIS — F17210 Nicotine dependence, cigarettes, uncomplicated: Secondary | ICD-10-CM | POA: Insufficient documentation

## 2016-03-19 DIAGNOSIS — T814XXA Infection following a procedure, initial encounter: Secondary | ICD-10-CM | POA: Diagnosis present

## 2016-03-19 HISTORY — DX: Unspecified infectious disease: B99.9

## 2016-03-19 HISTORY — DX: Gastro-esophageal reflux disease without esophagitis: K21.9

## 2016-03-19 HISTORY — PX: HARDWARE REMOVAL: SHX979

## 2016-03-19 LAB — CBC
HEMATOCRIT: 40 % (ref 39.0–52.0)
HEMOGLOBIN: 13 g/dL (ref 13.0–17.0)
MCH: 27.6 pg (ref 26.0–34.0)
MCHC: 32.5 g/dL (ref 30.0–36.0)
MCV: 84.9 fL (ref 78.0–100.0)
Platelets: 255 10*3/uL (ref 150–400)
RBC: 4.71 MIL/uL (ref 4.22–5.81)
RDW: 15.7 % — ABNORMAL HIGH (ref 11.5–15.5)
WBC: 7 10*3/uL (ref 4.0–10.5)

## 2016-03-19 LAB — SURGICAL PCR SCREEN
MRSA, PCR: NEGATIVE
Staphylococcus aureus: NEGATIVE

## 2016-03-19 SURGERY — REMOVAL, HARDWARE
Anesthesia: General | Site: Ankle | Laterality: Right

## 2016-03-19 MED ORDER — EPHEDRINE 5 MG/ML INJ
INTRAVENOUS | Status: AC
  Filled 2016-03-19: qty 20

## 2016-03-19 MED ORDER — MIDAZOLAM HCL 2 MG/2ML IJ SOLN
INTRAMUSCULAR | Status: AC
  Filled 2016-03-19: qty 2

## 2016-03-19 MED ORDER — MIDAZOLAM HCL 5 MG/5ML IJ SOLN
INTRAMUSCULAR | Status: DC | PRN
  Administered 2016-03-19: 2 mg via INTRAVENOUS

## 2016-03-19 MED ORDER — CHLORHEXIDINE GLUCONATE 4 % EX LIQD: 60.0000 mL | Freq: Once | CUTANEOUS | Status: DC

## 2016-03-19 MED ORDER — CEFAZOLIN SODIUM-DEXTROSE 2-4 GM/100ML-% IV SOLN
2.0000 g | INTRAVENOUS | Status: AC
  Administered 2016-03-19: 2 g via INTRAVENOUS
  Filled 2016-03-19: qty 100

## 2016-03-19 MED ORDER — OXYCODONE-ACETAMINOPHEN 5-325 MG PO TABS
ORAL_TABLET | ORAL | Status: AC
  Administered 2016-03-19: 1 via ORAL
  Filled 2016-03-19: qty 1

## 2016-03-19 MED ORDER — OXYCODONE-ACETAMINOPHEN 5-325 MG PO TABS: 1.0000 | ORAL_TABLET | ORAL | 0 refills | Status: DC | PRN

## 2016-03-19 MED ORDER — ONDANSETRON HCL 4 MG/2ML IJ SOLN
INTRAMUSCULAR | Status: DC | PRN
Start: 2016-03-19 — End: 2016-03-19
  Administered 2016-03-19: 4 mg via INTRAVENOUS

## 2016-03-19 MED ORDER — PROMETHAZINE HCL 25 MG/ML IJ SOLN: 6.2500 mg | INTRAMUSCULAR | Status: DC | PRN

## 2016-03-19 MED ORDER — SUCCINYLCHOLINE CHLORIDE 200 MG/10ML IV SOSY
PREFILLED_SYRINGE | INTRAVENOUS | Status: AC
  Filled 2016-03-19: qty 10

## 2016-03-19 MED ORDER — 0.9 % SODIUM CHLORIDE (POUR BTL) OPTIME
TOPICAL | Status: DC | PRN
  Administered 2016-03-19: 1000 mL

## 2016-03-19 MED ORDER — LACTATED RINGERS IV SOLN
INTRAVENOUS | Status: DC
  Administered 2016-03-19 (×2): via INTRAVENOUS

## 2016-03-19 MED ORDER — FENTANYL CITRATE (PF) 100 MCG/2ML IJ SOLN
INTRAMUSCULAR | Status: AC
  Filled 2016-03-19: qty 2

## 2016-03-19 MED ORDER — ROCURONIUM BROMIDE 50 MG/5ML IV SOSY
PREFILLED_SYRINGE | INTRAVENOUS | Status: AC
  Filled 2016-03-19: qty 5

## 2016-03-19 MED ORDER — PROPOFOL 10 MG/ML IV BOLUS
INTRAVENOUS | Status: DC | PRN
  Administered 2016-03-19: 200 mg via INTRAVENOUS

## 2016-03-19 MED ORDER — PROPOFOL 10 MG/ML IV BOLUS
INTRAVENOUS | Status: AC
  Filled 2016-03-19: qty 20

## 2016-03-19 MED ORDER — SODIUM CHLORIDE 0.9 % IR SOLN
Status: DC | PRN
  Administered 2016-03-19: 3000 mL

## 2016-03-19 MED ORDER — OXYCODONE-ACETAMINOPHEN 5-325 MG PO TABS
1.0000 | ORAL_TABLET | Freq: Once | ORAL | Status: AC
  Administered 2016-03-19: 1 via ORAL

## 2016-03-19 MED ORDER — DOXYCYCLINE HYCLATE 100 MG PO TABS: 100.0000 mg | ORAL_TABLET | Freq: Two times a day (BID) | ORAL | 0 refills | Status: DC

## 2016-03-19 MED ORDER — PHENYLEPHRINE 40 MCG/ML (10ML) SYRINGE FOR IV PUSH (FOR BLOOD PRESSURE SUPPORT)
PREFILLED_SYRINGE | INTRAVENOUS | Status: AC
  Filled 2016-03-19: qty 20

## 2016-03-19 MED ORDER — FENTANYL CITRATE (PF) 100 MCG/2ML IJ SOLN
INTRAMUSCULAR | Status: AC
  Administered 2016-03-19: 25 ug via INTRAVENOUS
  Filled 2016-03-19: qty 2

## 2016-03-19 MED ORDER — FENTANYL CITRATE (PF) 100 MCG/2ML IJ SOLN
INTRAMUSCULAR | Status: DC | PRN
  Administered 2016-03-19 (×4): 50 ug via INTRAVENOUS
  Administered 2016-03-19: 100 ug via INTRAVENOUS

## 2016-03-19 MED ORDER — FENTANYL CITRATE (PF) 100 MCG/2ML IJ SOLN
INTRAMUSCULAR | Status: AC
Start: 2016-03-19 — End: 2016-03-19
  Filled 2016-03-19: qty 2

## 2016-03-19 MED ORDER — LIDOCAINE HCL (CARDIAC) 20 MG/ML IV SOLN
INTRAVENOUS | Status: DC | PRN
  Administered 2016-03-19: 60 mg via INTRAVENOUS

## 2016-03-19 MED ORDER — LIDOCAINE 2% (20 MG/ML) 5 ML SYRINGE
INTRAMUSCULAR | Status: AC
  Filled 2016-03-19: qty 15

## 2016-03-19 MED ORDER — FENTANYL CITRATE (PF) 100 MCG/2ML IJ SOLN
25.0000 ug | INTRAMUSCULAR | Status: DC | PRN
  Administered 2016-03-19 (×2): 25 ug via INTRAVENOUS
  Administered 2016-03-19: 50 ug via INTRAVENOUS

## 2016-03-19 SURGICAL SUPPLY — 48 items
BANDAGE ESMARK 6X9 LF (GAUZE/BANDAGES/DRESSINGS) IMPLANT
BNDG COHESIVE 4X5 TAN STRL (GAUZE/BANDAGES/DRESSINGS) ×2 IMPLANT
BNDG ESMARK 6X9 LF (GAUZE/BANDAGES/DRESSINGS)
BNDG GAUZE ELAST 4 BULKY (GAUZE/BANDAGES/DRESSINGS) ×2 IMPLANT
CONT SPEC 4OZ CLIKSEAL STRL BL (MISCELLANEOUS) ×2 IMPLANT
COVER SURGICAL LIGHT HANDLE (MISCELLANEOUS) ×4 IMPLANT
CUFF TOURNIQUET SINGLE 34IN LL (TOURNIQUET CUFF) IMPLANT
CUFF TOURNIQUET SINGLE 44IN (TOURNIQUET CUFF) IMPLANT
DRAPE C-ARM 42X72 X-RAY (DRAPES) IMPLANT
DRAPE C-ARM MINI 42X72 WSTRAPS (DRAPES) ×2 IMPLANT
DRAPE INCISE IOBAN 66X45 STRL (DRAPES) IMPLANT
DRAPE ORTHO SPLIT 77X108 STRL (DRAPES)
DRAPE SURG ORHT 6 SPLT 77X108 (DRAPES) IMPLANT
DRAPE U-SHAPE 47X51 STRL (DRAPES) ×2 IMPLANT
DRSG ADAPTIC 3X8 NADH LF (GAUZE/BANDAGES/DRESSINGS) ×2 IMPLANT
DRSG EMULSION OIL 3X3 NADH (GAUZE/BANDAGES/DRESSINGS) ×2 IMPLANT
DRSG PAD ABDOMINAL 8X10 ST (GAUZE/BANDAGES/DRESSINGS) ×2 IMPLANT
DURAPREP 26ML APPLICATOR (WOUND CARE) ×2 IMPLANT
ELECT REM PT RETURN 9FT ADLT (ELECTROSURGICAL) ×2
ELECTRODE REM PT RTRN 9FT ADLT (ELECTROSURGICAL) ×1 IMPLANT
GAUZE SPONGE 4X4 12PLY STRL (GAUZE/BANDAGES/DRESSINGS) ×2 IMPLANT
GLOVE BIOGEL PI IND STRL 9 (GLOVE) ×1 IMPLANT
GLOVE BIOGEL PI INDICATOR 9 (GLOVE) ×1
GLOVE SURG ORTHO 9.0 STRL STRW (GLOVE) ×2 IMPLANT
GOWN STRL REUS W/ TWL XL LVL3 (GOWN DISPOSABLE) ×3 IMPLANT
GOWN STRL REUS W/TWL XL LVL3 (GOWN DISPOSABLE) ×3
HANDPIECE INTERPULSE COAX TIP (DISPOSABLE) ×1
KIT BASIN OR (CUSTOM PROCEDURE TRAY) ×2 IMPLANT
KIT ROOM TURNOVER OR (KITS) ×2 IMPLANT
MANIFOLD NEPTUNE II (INSTRUMENTS) ×2 IMPLANT
NS IRRIG 1000ML POUR BTL (IV SOLUTION) ×2 IMPLANT
PACK ORTHO EXTREMITY (CUSTOM PROCEDURE TRAY) ×2 IMPLANT
PAD ARMBOARD 7.5X6 YLW CONV (MISCELLANEOUS) ×4 IMPLANT
SET HNDPC FAN SPRY TIP SCT (DISPOSABLE) ×1 IMPLANT
SPONGE LAP 18X18 X RAY DECT (DISPOSABLE) IMPLANT
STAPLER VISISTAT 35W (STAPLE) IMPLANT
STOCKINETTE IMPERVIOUS 9X36 MD (GAUZE/BANDAGES/DRESSINGS) IMPLANT
SUT ETHILON 2 0 PSLX (SUTURE) IMPLANT
SUT VIC AB 0 CT1 27 (SUTURE)
SUT VIC AB 0 CT1 27XBRD ANBCTR (SUTURE) IMPLANT
SUT VIC AB 2-0 CT1 27 (SUTURE)
SUT VIC AB 2-0 CT1 TAPERPNT 27 (SUTURE) IMPLANT
TOWEL OR 17X24 6PK STRL BLUE (TOWEL DISPOSABLE) ×2 IMPLANT
TOWEL OR 17X26 10 PK STRL BLUE (TOWEL DISPOSABLE) ×2 IMPLANT
TUBE CONNECTING 12X1/4 (SUCTIONS) ×2 IMPLANT
UNDERPAD 30X30 (UNDERPADS AND DIAPERS) ×2 IMPLANT
WATER STERILE IRR 1000ML POUR (IV SOLUTION) ×2 IMPLANT
YANKAUER SUCT BULB TIP NO VENT (SUCTIONS) ×2 IMPLANT

## 2016-03-19 NOTE — Anesthesia Postprocedure Evaluation (Signed)
Anesthesia Post Note  Patient: Scott Hebert  Procedure(s) Performed: Procedure(s) (LRB): HARDWARE REMOVAL RIGHT ANKLE (Right)  Patient location during evaluation: PACU Anesthesia Type: General Level of consciousness: awake Pain management: pain level controlled Vital Signs Assessment: post-procedure vital signs reviewed and stable Respiratory status: spontaneous breathing Cardiovascular status: stable Anesthetic complications: no       Last Vitals:  Vitals:   03/19/16 1306 03/19/16 1630  BP: (!) 161/111   Pulse: 95   Resp: 20   Temp: 36.9 C 36.4 C    Last Pain:  Vitals:   03/19/16 1645  TempSrc:   PainSc: 5                  Countess Biebel

## 2016-03-19 NOTE — H&P (Signed)
Scott Hebert is an 56 y.o. male.   Chief Complaint: Pain and draining ulcer left lateral malleolus status post open reduction internal fixation. HPI: Patient is a smoker who did not stop smoking postoperatively. He has had cellulitis and a draining ulcer he is undergone oral antibiotics without resolution.  Past Medical History:  Diagnosis Date  . Allergy   . GERD (gastroesophageal reflux disease)   . Infection    RLE    Past Surgical History:  Procedure Laterality Date  . ORIF ANKLE FRACTURE Right 01/18/2016   Procedure: OPEN REDUCTION INTERNAL FIXATION (ORIF) ANKLE FRACTURE;  Surgeon: Newt Minion, MD;  Location: [facility];  Service: Orthopedics;  Laterality: Right;    Family History  Problem Relation Age of Onset  . Cancer Mother    Social History:  reports that he has been smoking Cigarettes.  He has been smoking about 0.50 packs per day. He has never used smokeless tobacco. He reports that he drinks alcohol. He reports that he does not use drugs.  Allergies:  Allergies  Allergen Reactions  . Shellfish-Derived Products Anaphylaxis    No prescriptions prior to admission.    No results found for this or any previous visit (from the past 48 hour(s)). No results found.  Review of Systems  All other systems reviewed and are negative.   There were no vitals taken for this visit. Physical Exam  On examination patient is alert oriented no adenopathy well-dressed normal affect normal respiratory effort he does have an antalgic gait. Examination he has cellulitis over the incision lateral malleolus left ankle. There is tenderness to palpation there is no ascending cellulitis there is some serosanguineous drainage from 2 small sinus tracts along the incision. Assessment/Plan Infected retained hardware lateral malleolus left ankle with continuation of smoking.  Plan: We'll plan for removal of deep retained hardware debridement of the bone. Risks and benefits were discussed including  persistent infection need for additional surgery importance of smoking cessation for limb salvage was discussed.  Newt Minion, MD 03/19/2016, 6:24 AM

## 2016-03-19 NOTE — Transfer of Care (Signed)
Immediate Anesthesia Transfer of Care Note  Patient: Scott Hebert  Procedure(s) Performed: Procedure(s): HARDWARE REMOVAL (Right)  Patient Location: PACU  Anesthesia Type:General  Level of Consciousness: awake, alert , oriented and patient cooperative  Airway & Oxygen Therapy: Patient Spontanous Breathing and Patient connected to nasal cannula oxygen  Post-op Assessment: Report given to RN and Post -op Vital signs reviewed and stable  Post vital signs: Reviewed and stable  Last Vitals:  Vitals:   03/19/16 1306  BP: (!) 161/111  Pulse: 95  Resp: 20  Temp: 36.9 C    Last Pain:  Vitals:   03/19/16 1311  TempSrc:   PainSc: 3       Patients Stated Pain Goal: 2 ([id number])  Complications: No apparent anesthesia complications

## 2016-03-19 NOTE — OR Nursing (Signed)
Discharge instructions were reviewed with pt and pt wife. Pain medication, wound care and follow up were reviewed with pt and family. Instructions on when to call the doctor were discussed as well. Pt and family denied any questions regarding discharge instructions. Pt pain level is tolerable and denied any n/v. Staff wheeled pt out in wheelchair and assisted pt to care. Pt knows that he is not to put weight on operative foot. Pt family brought his cam walker in and stated that they have crutches still at home that they will use.

## 2016-03-19 NOTE — Op Note (Signed)
03/19/2016  4:29 PM  PATIENT:  Scott Hebert    PRE-OPERATIVE DIAGNOSIS:  INFECTED RIGHT ANKLE, with retained hardware  POST-OPERATIVE DIAGNOSIS:  Same  PROCEDURE:  Deep HARDWARE REMOVAL with irrigation debridement of the right fibula with removal of bone soft tissue and skin. Local tissue rearrangement for wound closure 1 cm x 7 cm.  SURGEON:  Newt Minion, MD  PHYSICIAN ASSISTANT:None ANESTHESIA:   General  PREOPERATIVE INDICATIONS:  Athanasios Heldman is a  56 y.o. male with a diagnosis of INFECTED RIGHT ANKLE who failed conservative measures and elected for surgical management.    The risks benefits and alternatives were discussed with the patient preoperatively including but not limited to the risks of infection, bleeding, nerve injury, cardiopulmonary complications, the need for revision surgery, among others, and the patient was willing to proceed.  OPERATIVE IMPLANTS: none   OPERATIVE FINDINGS: deep abscess, cultures obtained  OPERATIVE PROCEDURE: Patient was brought to the operating room and underwent a general anesthetic. After adequate levels anesthesia obtained patient's right lower extremity was prepped using DuraPrep draped into a sterile field a timeout was called. A elliptical incision was made around the 2 draining sinus tracts. This left a wound 1 x 7 cm. The draining sinus tacks were removed with the skin layer. A purulent abscess was identified and the fluid was sent for cultures. The plate and interfrag screw were removed without complications. The wound was irrigated with pulsatile lavage. A nonviable piece of fibula was removed. Ronjair periosteal elevator were used for soft tissue and bone debridement. The wound was again irrigated with pulsatile lavage. The skin was closed using 2-0 nylon. A sterile compressive dressing was applied patient was extubated taken to the PACU in stable condition.  The importance of smoking cessation was discussed prescription for Percocet  provided and doxycycline. Culture sensitivity pending.

## 2016-03-19 NOTE — Progress Notes (Signed)
Orthopedic Tech Progress Note Patient Details:  Scott Hebert 28-May-1960 [id number]  Ortho Devices Type of Ortho Device: CAM walker Ortho Device/Splint Location: RLE Ortho Device/Splint Interventions: Ordered, Application   Braulio Bosch 03/19/2016, 4:50 PM

## 2016-03-19 NOTE — Anesthesia Procedure Notes (Signed)
Procedure Name: LMA Insertion Date/Time: 03/19/2016 3:50 PM Performed by: Shirlyn Goltz Pre-anesthesia Checklist: Patient identified, Emergency Drugs available, Suction available and Patient being monitored Patient Re-evaluated:Patient Re-evaluated prior to inductionOxygen Delivery Method: Circle system utilized Preoxygenation: Pre-oxygenation with 100% oxygen Intubation Type: IV induction Ventilation: Mask ventilation without difficulty LMA: LMA inserted LMA Size: 5.0 Number of attempts: 1 Placement Confirmation: positive ETCO2 and breath sounds checked- equal and bilateral Tube secured with: Tape Dental Injury: Teeth and Oropharynx as per pre-operative assessment

## 2016-03-19 NOTE — Anesthesia Preprocedure Evaluation (Addendum)
Anesthesia Evaluation  Patient identified by MRN, date of birth, ID band Patient awake    Reviewed: Allergy & Precautions, NPO status , Patient's Chart, lab work & pertinent test results  History of Anesthesia Complications Negative for: history of anesthetic complications  Airway Mallampati: II  TM Distance: >3 FB Neck ROM: Full    Dental  (+) Poor Dentition, Missing, Chipped, Dental Advisory Given   Pulmonary COPD (last inhaler use a week ago),  COPD inhaler, Current Smoker,    breath sounds clear to auscultation       Cardiovascular negative cardio ROS   Rhythm:Regular Rate:Normal     Neuro/Psych negative neurological ROS     GI/Hepatic Neg liver ROS, GERD  Controlled,  Endo/Other  Morbid obesity  Renal/GU negative Renal ROS     Musculoskeletal   Abdominal (+) + obese,   Peds  Hematology negative hematology ROS (+)   Anesthesia Other Findings   Reproductive/Obstetrics                             Anesthesia Physical  Anesthesia Plan  ASA: II  Anesthesia Plan: General   Post-op Pain Management:    Induction:   Airway Management Planned: LMA  Additional Equipment:   Intra-op Plan:   Post-operative Plan: Extubation in OR  Informed Consent: I have reviewed the patients History and Physical, chart, labs and discussed the procedure including the risks, benefits and alternatives for the proposed anesthesia with the patient or authorized representative who has indicated his/her understanding and acceptance.   Dental advisory given  Plan Discussed with: CRNA and Surgeon  Anesthesia Plan Comments:         Anesthesia Quick Evaluation

## 2016-03-20 ENCOUNTER — Encounter (HOSPITAL_COMMUNITY): Payer: Self-pay | Admitting: Orthopedic Surgery

## 2016-03-24 LAB — AEROBIC/ANAEROBIC CULTURE W GRAM STAIN (SURGICAL/DEEP WOUND)

## 2016-03-24 LAB — AEROBIC/ANAEROBIC CULTURE (SURGICAL/DEEP WOUND)

## 2016-04-01 ENCOUNTER — Encounter (INDEPENDENT_AMBULATORY_CARE_PROVIDER_SITE_OTHER): Payer: Self-pay | Admitting: Orthopedic Surgery

## 2016-04-01 ENCOUNTER — Ambulatory Visit (INDEPENDENT_AMBULATORY_CARE_PROVIDER_SITE_OTHER): Payer: BLUE CROSS/BLUE SHIELD | Admitting: Orthopedic Surgery

## 2016-04-01 VITALS — Ht 67.0 in | Wt 233.0 lb

## 2016-04-01 DIAGNOSIS — T847XXS Infection and inflammatory reaction due to other internal orthopedic prosthetic devices, implants and grafts, sequela: Secondary | ICD-10-CM

## 2016-04-01 MED ORDER — SULFAMETHOXAZOLE-TRIMETHOPRIM 800-160 MG PO TABS: 1.0000 | ORAL_TABLET | Freq: Two times a day (BID) | ORAL | 0 refills | Status: DC

## 2016-04-01 MED ORDER — SILVER SULFADIAZINE 1 % EX CREA: 1.0000 "application " | TOPICAL_CREAM | Freq: Every day | CUTANEOUS | 0 refills | Status: DC

## 2016-04-01 NOTE — Progress Notes (Signed)
Office Visit Note   Patient: Scott Hebert           Date of Birth: 04/04/1960           MRN: [id number] Visit Date: 04/01/2016              Requested by: No referring provider defined for this encounter. PCP: [name] Patient  Chief Complaint  Patient presents with  . Right Ankle - Routine Post Op    03/19/16 removal [facility] I&D right fib with removal of bone and soft tissue.     HPI: The patient is a 56 year old gentleman presents status post removal deep retained hardware with irrigation and debridement right fibula all bone and soft tissue on March 9 of this year. Having slight yellow drainage. Stitches are intact. Dates is using antibacterial ointment dressing changes daily is full weightbearing in a fracture boot. States he would like to return to work on April 2.  Assessment & Plan: Visit Diagnoses:  1. 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 Tinea dressing changes daily. Follow-up in one more week for suture removal.  Follow-Up Instructions: Return in about 1 week (around 04/08/2016).   Ortho Exam  Patient is alert, oriented, no adenopathy, well-dressed, normal affect, normal respiratory effort. Incision right ankle is well approximated with sutures. There is some exudative tissue along the length of the incision there is scant clear drainage. No odor no surrounding erythema dermatitis or cellulitis.  Imaging: No results found.  Labs: Lab Results  Component Value Date   REPTSTATUS 03/24/2016 FINAL 03/19/2016   GRAMSTAIN  03/19/2016    RARE WBC PRESENT, PREDOMINANTLY PMN NO ORGANISMS SEEN    CULT  03/19/2016    RARE METHICILLIN RESISTANT STAPHYLOCOCCUS AUREUS NO ANAEROBES ISOLATED    LABORGA METHICILLIN RESISTANT STAPHYLOCOCCUS AUREUS 03/19/2016    Orders:  No orders of the defined types were placed in this encounter.  Meds ordered this encounter  Medications  .  sulfamethoxazole-trimethoprim (BACTRIM DS,SEPTRA DS) 800-160 MG tablet    Sig: Take 1 tablet by mouth 2 (two) times daily.    Dispense:  60 tablet    Refill:  0  . silver sulfADIAZINE (SILVADENE) 1 % cream    Sig: Apply 1 application topically daily.    Dispense:  50 g    Refill:  0     Procedures: No procedures performed  Clinical Data: No additional findings.  ROS: Review of Systems  Constitutional: Negative for chills and fever.    Objective: Vital Signs: Ht 5\' 7"  (1.702 m)   Wt 233 lb (105.7 kg)   BMI 36.49 kg/m   Specialty Comments:  No specialty comments available.  PMFS History: Patient Active Problem List   Diagnosis Date Noted  . 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 Closed extra-articular fracture of distal end of right tibia   . Closed fracture of distal end of fibula, unspecified fracture morphology, initial encounter 01/17/2016  . Allergy 12/07/2011   Past Medical History:  Diagnosis Date  . Allergy   . GERD (gastroesophageal reflux disease)   . Infection    RLE    Family History  Problem Relation Age of Onset  . Cancer Mother     Past Surgical History:  Procedure Laterality Date  . HARDWARE REMOVAL Right 03/19/2016   Procedure: HARDWARE REMOVAL RIGHT ANKLE;  Surgeon: Newt Minion, MD;  Location: [facility];  Service: Orthopedics;  Laterality: Right;  . ORIF ANKLE FRACTURE Right 01/18/2016   Procedure: OPEN REDUCTION INTERNAL FIXATION (ORIF) ANKLE FRACTURE;  Surgeon: Newt Minion, MD;  Location: [facility];  Service: Orthopedics;  Laterality: Right;   Social History   Occupational History  . [employer]   Social History Main Topics  . Smoking status: Current Every Day Smoker    Packs/day: 0.50    Types: Cigarettes  . Smokeless tobacco: Never Used     Comment: using electronic vapor cigarette to help him quit smoking.  . Alcohol use 0.0 - 2.0 oz/week     Comment: every other week  . Drug use: No  . Sexual  activity: Yes    Partners: Female    Birth control/ protection: None

## 2016-04-08 ENCOUNTER — Ambulatory Visit (INDEPENDENT_AMBULATORY_CARE_PROVIDER_SITE_OTHER): Payer: BLUE CROSS/BLUE SHIELD | Admitting: Family

## 2016-04-08 DIAGNOSIS — T847XXS Infection and inflammatory reaction due to other internal orthopedic prosthetic devices, implants and grafts, sequela: Secondary | ICD-10-CM

## 2016-04-12 NOTE — Progress Notes (Signed)
Office Visit Note   Patient: Scott Hebert           Date of Birth: 1960/04/13           MRN: [id number] Visit Date: 04/08/2016              Requested by: No referring provider defined for this encounter. PCP: [name] Patient  No chief complaint on file.     HPI: The patient is a 56 year old gentleman who presents today 3 weeks status post excision of hardware right ankle. Continues to wear the fracture boot. Implant of some burning pain and continued clear drainage from his incision. States is doing daily bactroban dressing changes. Feels he is getting better overall. Denies pain with ambulation, states he really would like to be released to return to work.  Assessment & Plan: Visit Diagnoses:  1. 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 Follow-up in one week we'll reevaluate return to work status at that time continue with daily wound care  Follow-Up Instructions: Return in about 1 week (around 04/15/2016).   Ortho Exam  Patient is alert, oriented, no adenopathy, well-dressed, normal affect, normal respiratory effort. An incision is healing. There is exudative tissue in the distal half of the incision this is open about 4 mm does not have any depths does have scant clear drainage today. There is no surrounding erythema no odor no sign of infection. Sutures remain in place.  Imaging: No results found.  Labs: Lab Results  Component Value Date   REPTSTATUS 03/24/2016 FINAL 03/19/2016   GRAMSTAIN  03/19/2016    RARE WBC PRESENT, PREDOMINANTLY PMN NO ORGANISMS SEEN    CULT  03/19/2016    RARE METHICILLIN RESISTANT STAPHYLOCOCCUS AUREUS NO ANAEROBES ISOLATED    LABORGA METHICILLIN RESISTANT STAPHYLOCOCCUS AUREUS 03/19/2016    Orders:  No orders of the defined types were placed in this encounter.  No orders of the defined types were placed in this encounter.    Procedures: No procedures performed  Clinical Data: No additional  findings.  ROS:  All other systems negative, except as noted in the HPI. Review of Systems  Constitutional: Negative for chills and fever.  Cardiovascular: Negative for leg swelling.  Skin: Negative for color change and rash.    Objective: Vital Signs: There were no vitals taken for this visit.  Specialty Comments:  No specialty comments available.  PMFS History: Patient Active Problem List   Diagnosis Date Noted  . 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 Closed extra-articular fracture of distal end of right tibia   . Closed fracture of distal end of fibula, unspecified fracture morphology, initial encounter 01/17/2016  . Allergy 12/07/2011   Past Medical History:  Diagnosis Date  . Allergy   . GERD (gastroesophageal reflux disease)   . Infection    RLE    Family History  Problem Relation Age of Onset  . Cancer Mother     Past Surgical History:  Procedure Laterality Date  . HARDWARE REMOVAL Right 03/19/2016   Procedure: HARDWARE REMOVAL RIGHT ANKLE;  Surgeon: Newt Minion, MD;  Location: [facility];  Service: Orthopedics;  Laterality: Right;  . ORIF ANKLE FRACTURE Right 01/18/2016   Procedure: OPEN REDUCTION INTERNAL FIXATION (ORIF) ANKLE FRACTURE;  Surgeon: Newt Minion, MD;  Location: [facility];  Service: Orthopedics;  Laterality: Right;   Social History   Occupational History  . [employer]   Social History  Main Topics  . Smoking status: Current Every Day Smoker    Packs/day: 0.50    Types: Cigarettes  . Smokeless tobacco: Never Used     Comment: using electronic vapor cigarette to help him quit smoking.  . Alcohol use 0.0 - 2.0 oz/week     Comment: every other week  . Drug use: No  . Sexual activity: Yes    Partners: Female    Birth control/ protection: None

## 2016-04-15 ENCOUNTER — Ambulatory Visit (INDEPENDENT_AMBULATORY_CARE_PROVIDER_SITE_OTHER): Payer: BLUE CROSS/BLUE SHIELD | Admitting: Orthopedic Surgery

## 2016-04-15 ENCOUNTER — Other Ambulatory Visit (INDEPENDENT_AMBULATORY_CARE_PROVIDER_SITE_OTHER): Payer: Self-pay | Admitting: Family

## 2016-04-15 ENCOUNTER — Telehealth (INDEPENDENT_AMBULATORY_CARE_PROVIDER_SITE_OTHER): Payer: Self-pay | Admitting: Orthopedic Surgery

## 2016-04-15 MED ORDER — OXYCODONE-ACETAMINOPHEN 5-325 MG PO TABS: 1.0000 | ORAL_TABLET | Freq: Three times a day (TID) | ORAL | 0 refills | Status: DC | PRN

## 2016-04-15 NOTE — Telephone Encounter (Signed)
Patient called needing Rx refilled (Oxycodone) The number to contact patient is [phone] °

## 2016-04-15 NOTE — Telephone Encounter (Signed)
Erin wrote rx, patients wife picked up.

## 2016-04-21 ENCOUNTER — Ambulatory Visit (INDEPENDENT_AMBULATORY_CARE_PROVIDER_SITE_OTHER): Payer: BLUE CROSS/BLUE SHIELD | Admitting: Orthopedic Surgery

## 2016-04-21 DIAGNOSIS — T847XXS Infection and inflammatory reaction due to other internal orthopedic prosthetic devices, implants and grafts, sequela: Secondary | ICD-10-CM

## 2016-04-21 MED ORDER — OXYCODONE-ACETAMINOPHEN 5-325 MG PO TABS: 1.0000 | ORAL_TABLET | Freq: Three times a day (TID) | ORAL | 0 refills | Status: DC | PRN

## 2016-04-21 NOTE — Progress Notes (Signed)
Office Visit Note   Patient: Scott Hebert           Date of Birth: 01/04/1961           MRN: [id number] Visit Date: 04/21/2016              Requested by: No referring provider defined for this encounter. PCP: [name] Patient  Chief Complaint  Patient presents with  . Right Ankle - Routine Post Op      HPI: Patient status post irrigation debridement for infected ankle hardware right ankle.  Assessment & Plan: Visit Diagnoses: No diagnosis found.  Plan: Continue with the Bactroban dressing changes continue with the medical compression sock he is to wear the medical compression sock around-the-clock due to his swelling in the right lower extremity.  Follow-Up Instructions: Return in about 2 weeks (around 05/05/2016).   Ortho Exam  Patient is alert, oriented, no adenopathy, well-dressed, normal affect, normal respiratory effort. Examination there is no cellulitis there was some fibrinous exudative tissue in the wound this was debrided back to bleeding viable granulation tissue. The wound is 3 x 5 mm and 2 mm deep.  Imaging: No results found.  Labs: Lab Results  Component Value Date   REPTSTATUS 03/24/2016 FINAL 03/19/2016   GRAMSTAIN  03/19/2016    RARE WBC PRESENT, PREDOMINANTLY PMN NO ORGANISMS SEEN    CULT  03/19/2016    RARE METHICILLIN RESISTANT STAPHYLOCOCCUS AUREUS NO ANAEROBES ISOLATED    LABORGA METHICILLIN RESISTANT STAPHYLOCOCCUS AUREUS 03/19/2016    Orders:  No orders of the defined types were placed in this encounter.  Meds ordered this encounter  Medications  . oxyCODONE-acetaminophen (ROXICET) 5-325 MG tablet    Sig: Take 1 tablet by mouth every 8 (eight) hours as needed for severe pain.    Dispense:  30 tablet    Refill:  0     Procedures: No procedures performed  Clinical Data: No additional findings.  ROS:  All other systems negative, except as noted in the HPI. Review of Systems  Objective: Vital Signs: There were no vitals  taken for this visit.  Specialty Comments:  No specialty comments available.  PMFS History: Patient Active Problem List   Diagnosis Date Noted  . 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 Closed extra-articular fracture of distal end of right tibia   . Closed fracture of distal end of fibula, unspecified fracture morphology, initial encounter 01/17/2016  . Allergy 12/07/2011   Past Medical History:  Diagnosis Date  . Allergy   . GERD (gastroesophageal reflux disease)   . Infection    RLE    Family History  Problem Relation Age of Onset  . Cancer Mother     Past Surgical History:  Procedure Laterality Date  . HARDWARE REMOVAL Right 03/19/2016   Procedure: HARDWARE REMOVAL RIGHT ANKLE;  Surgeon: Newt Minion, MD;  Location: [facility];  Service: Orthopedics;  Laterality: Right;  . ORIF ANKLE FRACTURE Right 01/18/2016   Procedure: OPEN REDUCTION INTERNAL FIXATION (ORIF) ANKLE FRACTURE;  Surgeon: Newt Minion, MD;  Location: [facility] [facility];  Service: Orthopedics;  Laterality: Right;   Social History   Occupational History  . [employer]   Social History Main Topics  . Smoking status: Current Every Day Smoker    Packs/day: 0.50    Types: Cigarettes  . Smokeless tobacco: Never Used     Comment: using electronic vapor cigarette to help him quit smoking.  . Alcohol use  0.0 - 2.0 oz/week     Comment: every other week  . Drug use: No  . Sexual activity: Yes    Partners: Female    Birth control/ protection: None

## 2016-05-04 ENCOUNTER — Ambulatory Visit (INDEPENDENT_AMBULATORY_CARE_PROVIDER_SITE_OTHER): Payer: BLUE CROSS/BLUE SHIELD | Admitting: Orthopedic Surgery

## 2016-05-12 ENCOUNTER — Encounter (INDEPENDENT_AMBULATORY_CARE_PROVIDER_SITE_OTHER): Payer: Self-pay | Admitting: Orthopedic Surgery

## 2016-05-12 ENCOUNTER — Ambulatory Visit (INDEPENDENT_AMBULATORY_CARE_PROVIDER_SITE_OTHER): Payer: BLUE CROSS/BLUE SHIELD | Admitting: Orthopedic Surgery

## 2016-05-12 DIAGNOSIS — T847XXS Infection and inflammatory reaction due to other internal orthopedic prosthetic devices, implants and grafts, sequela: Secondary | ICD-10-CM

## 2016-05-12 NOTE — Progress Notes (Signed)
Office Visit Note   Patient: Scott Hebert           Date of Birth: 25-May-1960           MRN: [id number] Visit Date: 05/12/2016              Requested by: No referring provider defined for this encounter. PCP: [name] Patient  Chief Complaint  Patient presents with  . Right Ankle - Follow-up    03/19/16 Hardware Removal Right Ankle      HPI: Patient reports that he is having episodes this swelling and then drainage laterally. He is still on his antibiotics. Patient states he has episodes of pain and swelling. He has been using Bactroban dressing changes. He states he has been wearing compression stockings but is not wearing them today.  Assessment & Plan: Visit Diagnoses:  1. 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 I'm concerned if he is having this drainage that there may be some residual infection.  Follow-Up Instructions: Return in about 2 weeks (around 05/26/2016).   Ortho Exam  Patient is alert, oriented, no adenopathy, well-dressed, normal affect, normal respiratory effort. On examination patient does have swelling around the ankle there is no drainage no cellulitis there is no tenderness to palpation around the ankle patient states that most of his pain is been over the medial malleolus however this is nontender to palpation. Patient was told at [facility] the medial malleolar fracture however I reviewed the radiographs there is no evidence of a medial malleolar fracture. The fibula is nontender to palpation.  Imaging: No results found.  Labs: Lab Results  Component Value Date   REPTSTATUS 03/24/2016 FINAL 03/19/2016   GRAMSTAIN  03/19/2016    RARE WBC PRESENT, PREDOMINANTLY PMN NO ORGANISMS SEEN    CULT  03/19/2016    RARE METHICILLIN RESISTANT STAPHYLOCOCCUS AUREUS NO ANAEROBES  ISOLATED    LABORGA METHICILLIN RESISTANT STAPHYLOCOCCUS AUREUS 03/19/2016    Orders:  No orders of the defined types were placed in this encounter.  No orders of the defined types were placed in this encounter.    Procedures: No procedures performed  Clinical Data: No additional findings.  ROS:  All other systems negative, except as noted in the HPI. Review of Systems  Objective: Vital Signs: There were no vitals taken for this visit.  Specialty Comments:  No specialty comments available.  PMFS History: Patient Active Problem List   Diagnosis Date Noted  . 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 Closed extra-articular fracture of distal end of right tibia   . Closed fracture of distal end of fibula, unspecified fracture morphology, initial encounter 01/17/2016  . Allergy 12/07/2011   Past Medical History:  Diagnosis Date  . Allergy   . GERD (gastroesophageal reflux disease)   . Infection    RLE    Family History  Problem Relation Age of Onset  . Cancer Mother     Past Surgical History:  Procedure Laterality Date  . HARDWARE REMOVAL Right 03/19/2016   Procedure: HARDWARE REMOVAL RIGHT ANKLE;  Surgeon: Newt Minion, MD;  Location: [facility];  Service: Orthopedics;  Laterality: Right;  . ORIF ANKLE FRACTURE Right 01/18/2016   Procedure: OPEN REDUCTION INTERNAL FIXATION (ORIF) ANKLE FRACTURE;  Surgeon: Newt Minion, MD;  Location: [facility];  Service: Orthopedics;  Laterality: Right;   Social History   Occupational History  . [employer]   Social History Main Topics  . Smoking status: Current Every Day Smoker    Packs/day: 0.50    Types: Cigarettes  . Smokeless tobacco: Never Used     Comment: using electronic vapor cigarette to help him quit smoking.  . Alcohol use 0.0 - 2.0 oz/week     Comment: every other week  . Drug use: No  . Sexual activity: Yes    Partners: Female    Birth control/ protection: None

## 2016-05-20 ENCOUNTER — Telehealth (INDEPENDENT_AMBULATORY_CARE_PROVIDER_SITE_OTHER): Payer: Self-pay | Admitting: Orthopedic Surgery

## 2016-05-20 NOTE — Telephone Encounter (Signed)
Call patient if he is still having pain we need to see him back in the office Friday or Monday and most likely will need to repeat the surgical debridement. If he can take anti-inflammatories he can take 2 Aleve twice a day until he is seen.

## 2016-05-20 NOTE — Telephone Encounter (Signed)
03/19/16 right ankle hardware removal. Pt complaining of continued discomfort and would like refill on pain medication. percocdet 5/325 our last refill was 04/22/16 #30 and another rx from [place] in [place] vicodin 7.5/325 # 20

## 2016-05-20 NOTE — Telephone Encounter (Signed)
Pt has an appt tomorrow °

## 2016-05-20 NOTE — Telephone Encounter (Signed)
Patient called needing Rx refilled (Oxycodone) Patient said the pain is really bad. Patient said he don't understand why he is still having so much pain. The number to contact patient is [phone]

## 2016-05-21 ENCOUNTER — Ambulatory Visit (INDEPENDENT_AMBULATORY_CARE_PROVIDER_SITE_OTHER): Payer: BLUE CROSS/BLUE SHIELD

## 2016-05-21 ENCOUNTER — Ambulatory Visit (INDEPENDENT_AMBULATORY_CARE_PROVIDER_SITE_OTHER): Payer: BLUE CROSS/BLUE SHIELD | Admitting: Orthopedic Surgery

## 2016-05-21 DIAGNOSIS — T847XXS Infection and inflammatory reaction due to other internal orthopedic prosthetic devices, implants and grafts, sequela: Secondary | ICD-10-CM

## 2016-05-21 DIAGNOSIS — M25571 Pain in right ankle and joints of right foot: Secondary | ICD-10-CM

## 2016-05-21 MED ORDER — OXYCODONE-ACETAMINOPHEN 5-325 MG PO TABS: 1.0000 | ORAL_TABLET | Freq: Three times a day (TID) | ORAL | 0 refills | Status: DC | PRN

## 2016-05-21 NOTE — Progress Notes (Signed)
Office Visit Note   Patient: Scott Hebert           Date of Birth: 1960-08-28           MRN: [id number] Visit Date: 05/21/2016              Requested by: No referring provider defined for this encounter. PCP: Patient, [name]  Chief Complaint  Patient presents with  . Right Ankle - Pain      HPI: Patient complains of pain medially states he has no pain laterally is been having a few drops of drainage he is still on oral antibiotics. Patient complains of increasing swelling of the ankle with prolonged activities.  Assessment & Plan: Visit Diagnoses:  1. Pain in right ankle and joints of right foot   2. 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 Follow-up in one week.  Follow-Up Instructions: Return in about 1 week (around 05/28/2016).   Ortho Exam  Patient is alert, oriented, no adenopathy, well-dressed, normal affect, normal respiratory effort. Examination patient has has a small drop of clear drainage from the lateral incision there is no tenderness to palpation along the fibulaisolated as no signs of infection. Examination medially he is tender to palpation of the medial malleolus as well as over the medial joint line. After informed consent 18-gauge needle was used to aspirate the ankle joint. Approximately 1 drop of fluid that was clear was aspirated. This was sent for cultures. Discussed the patient at this is positive we would need to do an arthroscopic debridement of the ankle as well as repeat debridement of the fibula.  Imaging: Xr Ankle Complete Right  Result Date: 05/21/2016 Three-view radiographs of the right ankle shows old bony changes from the retained hardware there is still a fibrous union at the fracture site. With some shortening of the fibula. There is some widening of the medial joint line no evidence of a  fracture of the medial malleolus. Patient's pain is primarily over the medial joint line which is widened by a few millimeters.   Labs: Lab Results  Component Value Date   REPTSTATUS 03/24/2016 FINAL 03/19/2016   GRAMSTAIN  03/19/2016    RARE WBC PRESENT, PREDOMINANTLY PMN NO ORGANISMS SEEN    CULT  03/19/2016    RARE METHICILLIN RESISTANT STAPHYLOCOCCUS AUREUS NO ANAEROBES ISOLATED    LABORGA METHICILLIN RESISTANT STAPHYLOCOCCUS AUREUS 03/19/2016    Orders:  Orders Placed This Encounter  Procedures  . XR Ankle Complete Right   Meds ordered this encounter  Medications  . oxyCODONE-acetaminophen (ROXICET) 5-325 MG tablet    Sig: Take 1 tablet by mouth every 8 (eight) hours as needed for severe pain.    Dispense:  30 tablet    Refill:  0     Procedures: No procedures performed  Clinical Data: No additional findings.  ROS:  All other systems negative, except as noted in the HPI. Review of Systems  Objective: Vital Signs: There were no vitals taken for this visit.  Specialty Comments:  No specialty comments available.  PMFS History: Patient Active Problem List   Diagnosis Date Noted  . 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 Closed extra-articular fracture of distal end of right tibia   . Closed fracture of distal end of fibula, unspecified fracture morphology, initial encounter 01/17/2016  .  Allergy 12/07/2011   Past Medical History:  Diagnosis Date  . Allergy   . GERD (gastroesophageal reflux disease)   . Infection    RLE    Family History  Problem Relation Age of Onset  . Cancer Mother     Past Surgical History:  Procedure Laterality Date  . HARDWARE REMOVAL Right 03/19/2016   Procedure: HARDWARE REMOVAL RIGHT ANKLE;  Surgeon: Newt Minion, MD;  Location: [facility];  Service: Orthopedics;  Laterality: Right;  . ORIF ANKLE FRACTURE Right 01/18/2016   Procedure: OPEN REDUCTION INTERNAL FIXATION (ORIF) ANKLE FRACTURE;  Surgeon: Newt Minion,  MD;  Location: [facility];  Service: Orthopedics;  Laterality: Right;   Social History   Occupational History  . [employer]   Social History Main Topics  . Smoking status: Current Every Day Smoker    Packs/day: 0.50    Types: Cigarettes  . Smokeless tobacco: Never Used     Comment: using electronic vapor cigarette to help him quit smoking.  . Alcohol use 0.0 - 2.0 oz/week     Comment: every other week  . Drug use: No  . Sexual activity: Yes    Partners: Female    Birth control/ protection: None

## 2016-05-22 ENCOUNTER — Telehealth (INDEPENDENT_AMBULATORY_CARE_PROVIDER_SITE_OTHER): Payer: Self-pay | Admitting: Orthopaedic Surgery

## 2016-05-22 NOTE — Telephone Encounter (Signed)
Spoke with patient on phone regarding positive culture from ankle aspiration. He's already on abx and he's had a h/o ankle infections s/p I&D and hardware removal.  He already has appt for wed with Dr. Sharol Given to discuss possible surgery

## 2016-05-24 ENCOUNTER — Other Ambulatory Visit (INDEPENDENT_AMBULATORY_CARE_PROVIDER_SITE_OTHER): Payer: Self-pay | Admitting: Family

## 2016-05-24 ENCOUNTER — Encounter (INDEPENDENT_AMBULATORY_CARE_PROVIDER_SITE_OTHER): Payer: Self-pay | Admitting: Orthopedic Surgery

## 2016-05-24 DIAGNOSIS — M869 Osteomyelitis, unspecified: Secondary | ICD-10-CM

## 2016-05-24 LAB — BODY FLUID CULTURE

## 2016-05-24 NOTE — Progress Notes (Signed)
I spoke with Scott Hebert.  Advised of lab results and need for surgery on Wed.  He states that he needs to speak with his fiance tonight and he will call me back to discuss further.

## 2016-05-25 ENCOUNTER — Telehealth (INDEPENDENT_AMBULATORY_CARE_PROVIDER_SITE_OTHER): Payer: Self-pay | Admitting: Radiology

## 2016-05-25 ENCOUNTER — Encounter (HOSPITAL_COMMUNITY): Payer: Self-pay | Admitting: *Deleted

## 2016-05-25 NOTE — Progress Notes (Signed)
I instructed Scott Hebert to stop Aleve until Dr instructions him to continue after surgery.

## 2016-05-26 ENCOUNTER — Ambulatory Visit (INDEPENDENT_AMBULATORY_CARE_PROVIDER_SITE_OTHER): Payer: BLUE CROSS/BLUE SHIELD | Admitting: Orthopedic Surgery

## 2016-05-26 ENCOUNTER — Encounter (HOSPITAL_COMMUNITY): Payer: Self-pay

## 2016-05-26 ENCOUNTER — Inpatient Hospital Stay (HOSPITAL_COMMUNITY): Admitting: Certified Registered Nurse Anesthetist

## 2016-05-26 ENCOUNTER — Inpatient Hospital Stay (HOSPITAL_COMMUNITY)
Admission: RE | Admit: 2016-05-26 | Discharge: 2016-06-02 | DRG: 493 | Disposition: A | Discharge status: Home Health | Source: Ambulatory Visit | Attending: Orthopedic Surgery | Admitting: Orthopedic Surgery

## 2016-05-26 ENCOUNTER — Encounter (HOSPITAL_COMMUNITY)
Admission: RE | Disposition: A | Discharge status: Home Health | Payer: Self-pay | Source: Ambulatory Visit | Attending: Orthopedic Surgery

## 2016-05-26 DIAGNOSIS — M00071 Staphylococcal arthritis, right ankle and foot: Principal | ICD-10-CM | POA: Diagnosis present

## 2016-05-26 DIAGNOSIS — F17210 Nicotine dependence, cigarettes, uncomplicated: Secondary | ICD-10-CM | POA: Diagnosis present

## 2016-05-26 DIAGNOSIS — Y792 Prosthetic and other implants, materials and accessory orthopedic devices associated with adverse incidents: Secondary | ICD-10-CM | POA: Diagnosis not present

## 2016-05-26 DIAGNOSIS — Z91013 Allergy to seafood: Secondary | ICD-10-CM | POA: Diagnosis not present

## 2016-05-26 DIAGNOSIS — J449 Chronic obstructive pulmonary disease, unspecified: Secondary | ICD-10-CM | POA: Diagnosis present

## 2016-05-26 DIAGNOSIS — T8469XD Infection and inflammatory reaction due to internal fixation device of other site, subsequent encounter: Secondary | ICD-10-CM

## 2016-05-26 DIAGNOSIS — I809 Phlebitis and thrombophlebitis of unspecified site: Secondary | ICD-10-CM

## 2016-05-26 DIAGNOSIS — M86061 Acute hematogenous osteomyelitis, right tibia and fibula: Secondary | ICD-10-CM

## 2016-05-26 DIAGNOSIS — Z5181 Encounter for therapeutic drug level monitoring: Secondary | ICD-10-CM | POA: Diagnosis not present

## 2016-05-26 DIAGNOSIS — Z981 Arthrodesis status: Secondary | ICD-10-CM | POA: Diagnosis not present

## 2016-05-26 DIAGNOSIS — K219 Gastro-esophageal reflux disease without esophagitis: Secondary | ICD-10-CM | POA: Diagnosis present

## 2016-05-26 DIAGNOSIS — M009 Pyogenic arthritis, unspecified: Secondary | ICD-10-CM | POA: Diagnosis present

## 2016-05-26 DIAGNOSIS — B9562 Methicillin resistant Staphylococcus aureus infection as the cause of diseases classified elsewhere: Secondary | ICD-10-CM | POA: Diagnosis present

## 2016-05-26 DIAGNOSIS — M868X6 Other osteomyelitis, lower leg: Secondary | ICD-10-CM | POA: Diagnosis not present

## 2016-05-26 DIAGNOSIS — Z6834 Body mass index (BMI) 34.0-34.9, adult: Secondary | ICD-10-CM | POA: Diagnosis not present

## 2016-05-26 DIAGNOSIS — M868X7 Other osteomyelitis, ankle and foot: Secondary | ICD-10-CM | POA: Diagnosis present

## 2016-05-26 DIAGNOSIS — A4902 Methicillin resistant Staphylococcus aureus infection, unspecified site: Secondary | ICD-10-CM

## 2016-05-26 DIAGNOSIS — M25571 Pain in right ankle and joints of right foot: Secondary | ICD-10-CM | POA: Diagnosis present

## 2016-05-26 DIAGNOSIS — T801XXA Vascular complications following infusion, transfusion and therapeutic injection, initial encounter: Secondary | ICD-10-CM

## 2016-05-26 HISTORY — DX: Unspecified osteoarthritis, unspecified site: M19.90

## 2016-05-26 HISTORY — PX: TIBIA DEBRIDEMENT: SHX2514

## 2016-05-26 HISTORY — PX: APPLICATION OF WOUND VAC: SHX5189

## 2016-05-26 HISTORY — PX: ANKLE ARTHROSCOPY: SHX545

## 2016-05-26 LAB — CBC
HEMATOCRIT: 36.2 % — AB (ref 39.0–52.0)
HEMOGLOBIN: 11.3 g/dL — AB (ref 13.0–17.0)
MCH: 25.4 pg — ABNORMAL LOW (ref 26.0–34.0)
MCHC: 31.2 g/dL (ref 30.0–36.0)
MCV: 81.3 fL (ref 78.0–100.0)
Platelets: 330 10*3/uL (ref 150–400)
RBC: 4.45 MIL/uL (ref 4.22–5.81)
RDW: 16 % — ABNORMAL HIGH (ref 11.5–15.5)
WBC: 6.9 10*3/uL (ref 4.0–10.5)

## 2016-05-26 LAB — SURGICAL PCR SCREEN
MRSA, PCR: NEGATIVE
STAPHYLOCOCCUS AUREUS: NEGATIVE

## 2016-05-26 SURGERY — ARTHROSCOPY, ANKLE
Anesthesia: General | Site: Ankle | Laterality: Right

## 2016-05-26 MED ORDER — HYDROMORPHONE HCL 1 MG/ML IJ SOLN: 0.2500 mg | INTRAMUSCULAR | Status: DC | PRN

## 2016-05-26 MED ORDER — POLYETHYLENE GLYCOL 3350 17 G PO PACK
17.0000 g | PACK | Freq: Every day | ORAL | Status: DC | PRN
  Filled 2016-05-26: qty 1

## 2016-05-26 MED ORDER — PHENYLEPHRINE HCL 10 MG/ML IJ SOLN
INTRAMUSCULAR | Status: DC | PRN
  Administered 2016-05-26 (×5): 80 ug via INTRAVENOUS

## 2016-05-26 MED ORDER — MUPIROCIN 2 % EX OINT
TOPICAL_OINTMENT | CUTANEOUS | Status: AC
  Filled 2016-05-26: qty 22

## 2016-05-26 MED ORDER — FENTANYL CITRATE (PF) 100 MCG/2ML IJ SOLN
INTRAMUSCULAR | Status: DC | PRN
  Administered 2016-05-26: 25 ug via INTRAVENOUS
  Administered 2016-05-26: 50 ug via INTRAVENOUS

## 2016-05-26 MED ORDER — LACTATED RINGERS IV SOLN
INTRAVENOUS | Status: DC
  Administered 2016-05-26: 10:00:00 via INTRAVENOUS

## 2016-05-26 MED ORDER — ACETAMINOPHEN 325 MG PO TABS
650.0000 mg | ORAL_TABLET | Freq: Four times a day (QID) | ORAL | Status: DC | PRN
  Administered 2016-05-28 – 2016-05-31 (×5): 650 mg via ORAL
  Filled 2016-05-26 (×5): qty 2

## 2016-05-26 MED ORDER — CEFAZOLIN SODIUM-DEXTROSE 2-4 GM/100ML-% IV SOLN
2.0000 g | INTRAVENOUS | Status: AC
  Administered 2016-05-26: 2 g via INTRAVENOUS
  Filled 2016-05-26: qty 100

## 2016-05-26 MED ORDER — LIDOCAINE 2% (20 MG/ML) 5 ML SYRINGE
INTRAMUSCULAR | Status: AC
  Filled 2016-05-26: qty 5

## 2016-05-26 MED ORDER — METHOCARBAMOL 1000 MG/10ML IJ SOLN
500.0000 mg | Freq: Four times a day (QID) | INTRAVENOUS | Status: DC | PRN
  Filled 2016-05-26: qty 5

## 2016-05-26 MED ORDER — KETOROLAC TROMETHAMINE 15 MG/ML IJ SOLN
15.0000 mg | Freq: Four times a day (QID) | INTRAMUSCULAR | Status: AC
  Administered 2016-05-26 – 2016-05-27 (×3): 15 mg via INTRAVENOUS
  Filled 2016-05-26 (×3): qty 1

## 2016-05-26 MED ORDER — ENSURE ENLIVE PO LIQD
237.0000 mL | Freq: Two times a day (BID) | ORAL | Status: DC
  Administered 2016-05-27 – 2016-06-02 (×8): 237 mL via ORAL

## 2016-05-26 MED ORDER — ASPIRIN EC 325 MG PO TBEC
325.0000 mg | DELAYED_RELEASE_TABLET | Freq: Every day | ORAL | Status: DC
  Administered 2016-05-27 – 2016-06-02 (×6): 325 mg via ORAL
  Filled 2016-05-26 (×6): qty 1

## 2016-05-26 MED ORDER — EPHEDRINE 5 MG/ML INJ
INTRAVENOUS | Status: AC
  Filled 2016-05-26: qty 10

## 2016-05-26 MED ORDER — SODIUM CHLORIDE 0.9 % IR SOLN
Status: DC | PRN
  Administered 2016-05-26: 1000 mL
  Administered 2016-05-26: 3000 mL

## 2016-05-26 MED ORDER — OXYCODONE HCL 5 MG PO TABS
5.0000 mg | ORAL_TABLET | ORAL | Status: DC | PRN
  Administered 2016-05-27 – 2016-05-31 (×25): 10 mg via ORAL
  Filled 2016-05-26 (×26): qty 2

## 2016-05-26 MED ORDER — BISACODYL 10 MG RE SUPP: 10.0000 mg | Freq: Every day | RECTAL | Status: DC | PRN

## 2016-05-26 MED ORDER — PROPOFOL 10 MG/ML IV BOLUS
INTRAVENOUS | Status: AC
  Filled 2016-05-26: qty 20

## 2016-05-26 MED ORDER — METHOCARBAMOL 500 MG PO TABS
500.0000 mg | ORAL_TABLET | Freq: Four times a day (QID) | ORAL | Status: DC | PRN
  Administered 2016-05-27 – 2016-05-31 (×10): 500 mg via ORAL
  Filled 2016-05-26 (×10): qty 1

## 2016-05-26 MED ORDER — BUPIVACAINE-EPINEPHRINE (PF) 0.5% -1:200000 IJ SOLN
INTRAMUSCULAR | Status: DC | PRN
  Administered 2016-05-26: 30 mL via PERINEURAL

## 2016-05-26 MED ORDER — MIDAZOLAM HCL 5 MG/5ML IJ SOLN
INTRAMUSCULAR | Status: DC | PRN
  Administered 2016-05-26: 2 mg via INTRAVENOUS

## 2016-05-26 MED ORDER — MUPIROCIN 2 % EX OINT
1.0000 "application " | TOPICAL_OINTMENT | Freq: Once | CUTANEOUS | Status: AC
  Administered 2016-05-26: 1 via TOPICAL

## 2016-05-26 MED ORDER — CHLORHEXIDINE GLUCONATE 4 % EX LIQD: 60.0000 mL | Freq: Once | CUTANEOUS | Status: DC

## 2016-05-26 MED ORDER — MIDAZOLAM HCL 2 MG/2ML IJ SOLN
INTRAMUSCULAR | Status: AC
  Filled 2016-05-26: qty 2

## 2016-05-26 MED ORDER — MAGNESIUM CITRATE PO SOLN
1.0000 | Freq: Once | ORAL | Status: AC | PRN
  Administered 2016-05-29: 1 via ORAL
  Filled 2016-05-26: qty 296

## 2016-05-26 MED ORDER — VANCOMYCIN HCL 10 G IV SOLR
1250.0000 mg | Freq: Two times a day (BID) | INTRAVENOUS | Status: DC
  Administered 2016-05-26 – 2016-05-29 (×6): 1250 mg via INTRAVENOUS
  Filled 2016-05-26 (×6): qty 1250

## 2016-05-26 MED ORDER — ONDANSETRON HCL 4 MG/2ML IJ SOLN
INTRAMUSCULAR | Status: DC | PRN
  Administered 2016-05-26: 4 mg via INTRAVENOUS

## 2016-05-26 MED ORDER — PHENYLEPHRINE 40 MCG/ML (10ML) SYRINGE FOR IV PUSH (FOR BLOOD PRESSURE SUPPORT)
PREFILLED_SYRINGE | INTRAVENOUS | Status: AC
  Filled 2016-05-26: qty 10

## 2016-05-26 MED ORDER — ONDANSETRON HCL 4 MG/2ML IJ SOLN: 4.0000 mg | Freq: Four times a day (QID) | INTRAMUSCULAR | Status: DC | PRN

## 2016-05-26 MED ORDER — PROPOFOL 10 MG/ML IV BOLUS
INTRAVENOUS | Status: DC | PRN
  Administered 2016-05-26: 200 mg via INTRAVENOUS

## 2016-05-26 MED ORDER — STERILE WATER FOR IRRIGATION IR SOLN
Status: DC | PRN
  Administered 2016-05-26: 1000 mL

## 2016-05-26 MED ORDER — OXYCODONE HCL 5 MG PO TABS: 5.0000 mg | ORAL_TABLET | Freq: Once | ORAL | Status: DC | PRN

## 2016-05-26 MED ORDER — FENTANYL CITRATE (PF) 250 MCG/5ML IJ SOLN
INTRAMUSCULAR | Status: AC
  Filled 2016-05-26: qty 5

## 2016-05-26 MED ORDER — PNEUMOCOCCAL VAC POLYVALENT 25 MCG/0.5ML IJ INJ
0.5000 mL | INJECTION | INTRAMUSCULAR | Status: AC
  Administered 2016-05-27: 0.5 mL via INTRAMUSCULAR
  Filled 2016-05-26: qty 0.5

## 2016-05-26 MED ORDER — EPHEDRINE SULFATE 50 MG/ML IJ SOLN
INTRAMUSCULAR | Status: DC | PRN
  Administered 2016-05-26 (×2): 5 mg via INTRAVENOUS
  Administered 2016-05-26: 10 mg via INTRAVENOUS

## 2016-05-26 MED ORDER — SODIUM CHLORIDE 0.9 % IV SOLN
INTRAVENOUS | Status: DC
  Administered 2016-05-26: 16:00:00 via INTRAVENOUS

## 2016-05-26 MED ORDER — FENTANYL CITRATE (PF) 100 MCG/2ML IJ SOLN
INTRAMUSCULAR | Status: AC
Start: 2016-05-26 — End: 2016-05-26
  Administered 2016-05-26: 100 ug
  Filled 2016-05-26: qty 2

## 2016-05-26 MED ORDER — HYDROMORPHONE HCL 1 MG/ML IJ SOLN
1.0000 mg | INTRAMUSCULAR | Status: DC | PRN
  Administered 2016-05-27: 1 mg via INTRAVENOUS
  Filled 2016-05-26: qty 1

## 2016-05-26 MED ORDER — ONDANSETRON HCL 4 MG PO TABS
4.0000 mg | ORAL_TABLET | Freq: Four times a day (QID) | ORAL | Status: DC | PRN
Start: 2016-05-26 — End: 2016-05-31

## 2016-05-26 MED ORDER — ONDANSETRON HCL 4 MG/2ML IJ SOLN
INTRAMUSCULAR | Status: AC
  Filled 2016-05-26: qty 4

## 2016-05-26 MED ORDER — DOCUSATE SODIUM 100 MG PO CAPS
100.0000 mg | ORAL_CAPSULE | Freq: Two times a day (BID) | ORAL | Status: DC
  Administered 2016-05-26 – 2016-05-30 (×9): 100 mg via ORAL
  Filled 2016-05-26 (×9): qty 1

## 2016-05-26 MED ORDER — BUPIVACAINE HCL (PF) 0.25 % IJ SOLN
INTRAMUSCULAR | Status: AC
  Filled 2016-05-26: qty 30

## 2016-05-26 MED ORDER — METOCLOPRAMIDE HCL 5 MG/ML IJ SOLN: 5.0000 mg | Freq: Three times a day (TID) | INTRAMUSCULAR | Status: DC | PRN

## 2016-05-26 MED ORDER — OXYCODONE HCL 5 MG/5ML PO SOLN: 5.0000 mg | Freq: Once | ORAL | Status: DC | PRN

## 2016-05-26 MED ORDER — ACETAMINOPHEN 650 MG RE SUPP: 650.0000 mg | Freq: Four times a day (QID) | RECTAL | Status: DC | PRN

## 2016-05-26 MED ORDER — METOCLOPRAMIDE HCL 5 MG PO TABS: 5.0000 mg | ORAL_TABLET | Freq: Three times a day (TID) | ORAL | Status: DC | PRN

## 2016-05-26 MED ORDER — MIDAZOLAM HCL 2 MG/2ML IJ SOLN
INTRAMUSCULAR | Status: AC
  Administered 2016-05-26: 2 mg
  Filled 2016-05-26: qty 2

## 2016-05-26 SURGICAL SUPPLY — 53 items
BLADE CUDA 5.5 (BLADE) IMPLANT
BLADE GREAT WHITE 4.2 (BLADE) ×2 IMPLANT
BLADE INCISOR PLUS 5.5 (BLADE) IMPLANT
BNDG COHESIVE 6X5 TAN STRL LF (GAUZE/BANDAGES/DRESSINGS) IMPLANT
BUR OVAL 4.0 (BURR) ×2 IMPLANT
CANISTER WOUND CARE 500ML ATS (WOUND CARE) ×2 IMPLANT
CASSETTE VERAFLO VERALINK (MISCELLANEOUS) ×2 IMPLANT
COVER SURGICAL LIGHT HANDLE (MISCELLANEOUS) ×2 IMPLANT
CUFF TOURNIQUET SINGLE 34IN LL (TOURNIQUET CUFF) IMPLANT
CUFF TOURNIQUET SINGLE 44IN (TOURNIQUET CUFF) IMPLANT
DRAPE ARTHROSCOPY W/POUCH 114 (DRAPES) ×2 IMPLANT
DRAPE C-ARM MINI 42X72 WSTRAPS (DRAPES) IMPLANT
DRAPE INCISE IOBAN 66X45 STRL (DRAPES) ×4 IMPLANT
DRAPE U-SHAPE 47X51 STRL (DRAPES) ×2 IMPLANT
DRESSING HYDROCOLLOID 4X4 XTH (GAUZE/BANDAGES/DRESSINGS) ×2 IMPLANT
DRSG EMULSION OIL 3X3 NADH (GAUZE/BANDAGES/DRESSINGS) ×2 IMPLANT
DRSG PAD ABDOMINAL 8X10 ST (GAUZE/BANDAGES/DRESSINGS) ×2 IMPLANT
DURAPREP 26ML APPLICATOR (WOUND CARE) ×2 IMPLANT
GAUZE SPONGE 4X4 12PLY STRL (GAUZE/BANDAGES/DRESSINGS) ×2 IMPLANT
GLOVE BIOGEL PI IND STRL 6.5 (GLOVE) ×1 IMPLANT
GLOVE BIOGEL PI IND STRL 7.5 (GLOVE) ×1 IMPLANT
GLOVE BIOGEL PI IND STRL 9 (GLOVE) ×1 IMPLANT
GLOVE BIOGEL PI INDICATOR 6.5 (GLOVE) ×1
GLOVE BIOGEL PI INDICATOR 7.5 (GLOVE) ×1
GLOVE BIOGEL PI INDICATOR 9 (GLOVE) ×1
GLOVE SS PI 9.0 STRL (GLOVE) ×2 IMPLANT
GLOVE SURG ORTHO 9.0 STRL STRW (GLOVE) ×2 IMPLANT
GLOVE SURG SS PI 6.5 STRL IVOR (GLOVE) ×4 IMPLANT
GOWN STRL REUS W/ TWL XL LVL3 (GOWN DISPOSABLE) ×3 IMPLANT
GOWN STRL REUS W/TWL XL LVL3 (GOWN DISPOSABLE) ×3
KIT BASIN OR (CUSTOM PROCEDURE TRAY) ×2 IMPLANT
KIT ROOM TURNOVER OR (KITS) ×2 IMPLANT
MANIFOLD NEPTUNE II (INSTRUMENTS) ×2 IMPLANT
NEEDLE 18GX1X1/2 (RX/OR ONLY) (NEEDLE) IMPLANT
PACK ARTHROSCOPY DSU (CUSTOM PROCEDURE TRAY) IMPLANT
PAD ARMBOARD 7.5X6 YLW CONV (MISCELLANEOUS) ×4 IMPLANT
PADDING CAST COTTON 6X4 STRL (CAST SUPPLIES) ×2 IMPLANT
SET ARTHROSCOPY TUBING (MISCELLANEOUS) ×1
SET ARTHROSCOPY TUBING LN (MISCELLANEOUS) ×1 IMPLANT
SPONGE LAP 4X18 X RAY DECT (DISPOSABLE) IMPLANT
SUT ETHILON 4 0 PS 2 18 (SUTURE) ×2 IMPLANT
SUT MNCRL AB 3-0 PS2 18 (SUTURE) IMPLANT
SUT VIC AB 2-0 CT1 27 (SUTURE)
SUT VIC AB 2-0 CT1 TAPERPNT 27 (SUTURE) IMPLANT
SWAB COLLECTION DEVICE MRSA (MISCELLANEOUS) ×2 IMPLANT
SYR 20CC LL (SYRINGE) IMPLANT
TAPE STRIPS DRAPE STRL (GAUZE/BANDAGES/DRESSINGS) IMPLANT
TOWEL OR 17X24 6PK STRL BLUE (TOWEL DISPOSABLE) ×2 IMPLANT
TOWEL OR 17X26 10 PK STRL BLUE (TOWEL DISPOSABLE) ×2 IMPLANT
TUBE ANAEROBIC SPECIMEN COL (MISCELLANEOUS) ×2 IMPLANT
WAND HAND CNTRL MULTIVAC 90 (MISCELLANEOUS) IMPLANT
WAND STAR VAC 90 (SURGICAL WAND) ×2 IMPLANT
WATER STERILE IRR 1000ML POUR (IV SOLUTION) ×2 IMPLANT

## 2016-05-26 NOTE — Op Note (Signed)
05/26/2016  12:41 PM  PATIENT:  Scott Hebert    PRE-OPERATIVE DIAGNOSIS:  Osteomyelitis Right Ankle  POST-OPERATIVE DIAGNOSIS:  Same  PROCEDURE: Excision partial right distal fibula. Irrigation and debridement open right ankle. Application of instillation wound VAC. Deep cultures obtained from fluid within the ankle.    SURGEON:  Newt Minion, MD  PHYSICIAN ASSISTANT:None ANESTHESIA:   General  PREOPERATIVE INDICATIONS:  [name] is a  56 y.o. male with a diagnosis of Osteomyelitis Right Ankle who failed conservative measures and elected for surgical management.    The risks benefits and alternatives were discussed with the patient preoperatively including but not limited to the risks of infection, bleeding, nerve injury, cardiopulmonary complications, the need for revision surgery, among others, and the patient was willing to proceed.  OPERATIVE IMPLANTS: Instillation wound VAC  OPERATIVE FINDINGS: Deep abscess within the ankle joint that communicated with the fracture site of the fibula.  OPERATIVE PROCEDURE: Patient was brought to the operating room and underwent a general anesthetic. After adequate levels anesthesia obtained patient's right lower extremity was prepped using DuraPrep draped into a sterile field a timeout was called. A lateral incision was made over the fibula this was carried sharply down to bone. The partial distal fibula was resected. There was purulence coming from the fracture site. The bone from the fracture site was resected in a deep abscess that communicated with the ankle joint was drained this was then irrigated with pulsatile lavage 3 L. The tissue and fluid was sent for cultures 2. Further distal fibula was resected. After all evidence of infection was resected a cleanse choice dressing was packed into the ankle joint and over the fibula this was then connected to the wound VAC this had a good suction fit 10 mL of instillation for 2 hours of  suction. 10 minutes of soaked time patient was extubated taken to the PACU in stable condition.

## 2016-05-26 NOTE — Anesthesia Postprocedure Evaluation (Addendum)
Anesthesia Post Note  Patient: Scott Hebert  Procedure(s) Performed: Procedure(s) (LRB): Irrigation and Debridement Right Fibula and Arthroscopic Debridement Right Ankle, Apply Wound VAC (Right)  Patient location during evaluation: PACU Anesthesia Type: General Level of consciousness: sedated Pain management: pain level controlled Vital Signs Assessment: post-procedure vital signs reviewed and stable Respiratory status: spontaneous breathing and respiratory function stable Cardiovascular status: stable Anesthetic complications: no       Last Vitals:  Vitals:   05/26/16 1303 05/26/16 1315  BP: 128/80   Pulse: 100 96  Resp: (!) 22 (!) 24  Temp:      Last Pain:  Vitals:   05/26/16 1315  TempSrc:   PainSc: 0-No pain                 Yuna Pizzolato DANIEL

## 2016-05-26 NOTE — Anesthesia Preprocedure Evaluation (Signed)
Anesthesia Evaluation  Patient identified by MRN, date of birth, ID band Patient awake    Reviewed: Allergy & Precautions, NPO status , Patient's Chart, lab work & pertinent test results  History of Anesthesia Complications Negative for: history of anesthetic complications  Airway Mallampati: II  TM Distance: >3 FB Neck ROM: Full    Dental  (+) Poor Dentition, Missing, Chipped, Dental Advisory Given   Pulmonary COPD (last inhaler use a week ago),  COPD inhaler, Current Smoker,    breath sounds clear to auscultation       Cardiovascular negative cardio ROS   Rhythm:Regular Rate:Normal     Neuro/Psych negative neurological ROS     GI/Hepatic Neg liver ROS, GERD  Controlled,  Endo/Other  Morbid obesity  Renal/GU negative Renal ROS     Musculoskeletal  (+) Arthritis ,   Abdominal (+) + obese,   Peds  Hematology  (+) anemia ,   Anesthesia Other Findings   Reproductive/Obstetrics                             Anesthesia Physical Anesthesia Plan  ASA: II  Anesthesia Plan: General   Post-op Pain Management:  Regional for Post-op pain   Induction: Intravenous  Airway Management Planned: LMA  Additional Equipment: None  Intra-op Plan:   Post-operative Plan: Extubation in OR  Informed Consent: I have reviewed the patients History and Physical, chart, labs and discussed the procedure including the risks, benefits and alternatives for the proposed anesthesia with the patient or authorized representative who has indicated his/her understanding and acceptance.   Dental advisory given  Plan Discussed with: CRNA and Surgeon  Anesthesia Plan Comments:         Anesthesia Quick Evaluation

## 2016-05-26 NOTE — Anesthesia Procedure Notes (Addendum)
Anesthesia Regional Block: Popliteal block   Pre-Anesthetic Checklist: ,, timeout performed, Correct Patient, Correct Site, Correct Laterality, Correct Procedure, Correct Position, site marked, Risks and benefits discussed,  Surgical consent,  Pre-op evaluation,  At surgeon's request and post-op pain management  Laterality: Right  Prep: chloraprep       Needles:  Injection technique: Single-shot  Needle Type: Echogenic Stimulator Needle          Additional Needles:   Procedures: ultrasound guided,,,,,,,,  Narrative:  Start time: 05/26/2016 11:07 AM End time: 05/26/2016 11:17 AM Injection made incrementally with aspirations every 5 mL.  Performed by: Personally  Anesthesiologist: Duane Boston  Additional Notes: A functioning IV was confirmed and monitors were applied.  Sterile prep and drape, hand hygiene and sterile gloves were used.  Negative aspiration and test dose prior to incremental administration of local anesthetic. The patient tolerated the procedure well.Ultrasound  guidance: relevant anatomy identified, needle position confirmed, local anesthetic spread visualized around nerve(s), vascular puncture avoided.  Image printed for medical record.

## 2016-05-26 NOTE — Consult Note (Signed)
[facility]  Total days of antibiotics 1        Day 1  cefazolin              Reason for Consult: MRSA septic arthritis with removed HW   Referring Physician: duda  Active Problems:   Septic arthritis of right ankle ([place])    HPI: Scott Hebert is a 56 y.o. male who sustained right ankle in January 2018 where he underwent ORIF with [facility] placement on 01/18/16. It was complicated by infection over the fibular HW which was removed and debrided on 3/9. OR cutlures grew MRSA and he was initially discharged on doxycycline which he did not tolerate, then transitioned to bactrim up until 5/11. He saw dr duda in follow up throughout this time but most recently complaining of worsening pain to his right ankle. He underwent arthrocentesis on 5/11 which grew MRSA. He was admitted to the hospital for excision of partial right distal fibula, I x D of open right ankle with wound vac placement. In surgery it was noted that he had a deep abscess within the ankle joint that communicated with the farcture site of the fibula.  The patient reports having chills, and nightsweats in the last week in concert with his ankle becoming exquisitely tender  Past Medical History:  Diagnosis Date  . Allergy   . Arthritis   . GERD (gastroesophageal reflux disease)   . Infection    RLE    Allergies:  Allergies  Allergen Reactions  . Shellfish-Derived Products Anaphylaxis   MEDICATIONS: . [START ON 05/27/2016] aspirin EC  325 mg Oral Daily  . docusate sodium  100 mg Oral BID  . ketorolac  15 mg Intravenous Q6H  . mupirocin ointment        Social History  Substance Use Topics  . Smoking status: Current Every Day Smoker    Packs/day: 0.25    Years: 10.00    Types: Cigarettes  . Smokeless tobacco: Never Used     Comment: using electronic vapor cigarette to help him quit smoking.  . Alcohol use 0.0 - 2.0 oz/week     Comment: every other week    Family History  Problem Relation  Age of Onset  . Cancer Mother      Review of Systems  Constitutional: positive for  chills, diaphoresis, but no change in temp ,activity change, appetite change, fatigue and unexpected weight change.  HENT: Negative for congestion, sore throat, rhinorrhea, sneezing, trouble swallowing and sinus pressure.  Eyes: Negative for photophobia and visual disturbance.  Respiratory: Negative for cough, chest tightness, shortness of breath, wheezing and stridor.  Cardiovascular: Negative for chest pain, palpitations and leg swelling.  Gastrointestinal: Negative for nausea, vomiting, abdominal pain, diarrhea, constipation, blood in stool, abdominal distention and anal bleeding.  Genitourinary: Negative for dysuria, hematuria, flank pain and difficulty urinating.  Musculoskeletal:+ right ankle pain. Negative for myalgias, back pain, joint swelling, arthralgias and gait problem.  Skin: Negative for color change, pallor, rash and wound.  Neurological: Negative for dizziness, tremors, weakness and light-headedness.  Hematological: Negative for adenopathy. Does not bruise/bleed easily.  Psychiatric/Behavioral: Negative for behavioral problems, confusion, sleep disturbance, dysphoric mood, decreased concentration and agitation.     OBJECTIVE: Temp:  [97.8 F (36.6 C)-98.4 F (36.9 C)] 98.2 F (36.8 C) (05/16 1608) Pulse Rate:  [85-107] 101 (05/16 1608) Resp:  [14-25] 17 (05/16 1608) BP: (110-147)/(76-96) 114/81 (05/16 1608) SpO2:  [92 %-100 %] 99 % (05/16  1608) Weight:  [235 lb (106.6 kg)] 235 lb (106.6 kg) (05/16 1019) Physical Exam  Constitutional: He is oriented to person, place, and time. He appears well-developed and well-nourished. No distress.  HENT:  Mouth/Throat: Oropharynx is clear and moist. No oropharyngeal exudate.  Cardiovascular: Normal rate, regular rhythm and normal heart sounds. Exam reveals no gallop and no friction rub.  No murmur heard.  Pulmonary/Chest: Effort normal and  breath sounds normal. No respiratory distress. He has no wheezes.  Abdominal: Soft. Bowel sounds are normal. He exhibits no distension. There is no tenderness.  Neurological: He is alert and oriented to person, place, and time.  Skin: Skin is warm and dry. No rash noted. No erythema.  Ext+ right ankle wound vac in place Psychiatric: He has a normal mood and affect. His behavior is normal.     LABS: Results for orders placed or performed during the hospital encounter of 05/26/16 (from the past 48 hour(s))  CBC     Status: Abnormal   Collection Time: 05/26/16  9:46 AM  Result Value Ref Range   WBC 6.9 4.0 - 10.5 K/uL   RBC 4.45 4.22 - 5.81 MIL/uL   Hemoglobin 11.3 (L) 13.0 - 17.0 g/dL   HCT 36.2 (L) 39.0 - 52.0 %   MCV 81.3 78.0 - 100.0 fL   MCH 25.4 (L) 26.0 - 34.0 pg   MCHC 31.2 30.0 - 36.0 g/dL   RDW 16.0 (H) 11.5 - 15.5 %   Platelets 330 150 - 400 K/uL  Surgical pcr screen     Status: None   Collection Time: 05/26/16 10:44 AM  Result Value Ref Range   MRSA, PCR NEGATIVE NEGATIVE   Staphylococcus aureus NEGATIVE NEGATIVE    Comment:        The Xpert SA Assay (FDA approved for NASAL specimens in patients over 28 years of age), is one component of a comprehensive surveillance program.  Test performance has been validated by [facility] for patients greater than or equal to 43 year old. It is not intended to diagnose infection nor to guide or monitor treatment.     MICRO: 5/16 tissue cx pending 5/11 aspirate MRSA  Assessment/Plan:  MRSA Right ankle septic arthritis/osteomyelitis  - plan to treat with vancomycin for 8 wk - hold off on getting picc line placement until we have blood cx results back in 48hr. - concern that his chills and nightsweats may also reflect systemic infection. Will get blood cultures - goal trough of 15-20. Will have pharmacy consultation to adjust dose - we will have him follow up with id clinic to monitor progress as well as monitor for any  nephrotoxicity - health maintenance = plan to check for hiv ab and hep c ab. - will check sed rate and crp.

## 2016-05-26 NOTE — Anesthesia Procedure Notes (Signed)
Procedure Name: LMA Insertion Date/Time: 05/26/2016 11:55 AM Performed by: Candis Shine Pre-anesthesia Checklist: Patient identified, Emergency Drugs available, Suction available and Patient being monitored Patient Re-evaluated:Patient Re-evaluated prior to inductionOxygen Delivery Method: Circle System Utilized Preoxygenation: Pre-oxygenation with 100% oxygen Intubation Type: IV induction Ventilation: Mask ventilation without difficulty LMA: LMA inserted LMA Size: 5.0 Number of attempts: 1 Airway Equipment and Method: Bite block Placement Confirmation: positive ETCO2 Tube secured with: Tape Dental Injury: Teeth and Oropharynx as per pre-operative assessment

## 2016-05-26 NOTE — Transfer of Care (Signed)
Immediate Anesthesia Transfer of Care Note  Patient: Scott Hebert  Procedure(s) Performed: Procedure(s): Irrigation and Debridement Right Fibula and Arthroscopic Debridement Right Ankle, Apply Wound VAC (Right)  Patient Location: PACU  Anesthesia Type:GA combined with regional for post-op pain  Level of Consciousness: awake, alert  and oriented  Airway & Oxygen Therapy: Patient Spontanous Breathing and Patient connected to nasal cannula oxygen  Post-op Assessment: Report given to RN and Post -op Vital signs reviewed and stable  Post vital signs: Reviewed and stable  Last Vitals:  Vitals:   05/26/16 0955  BP: (!) 147/96  Pulse: 85  Resp: 20  Temp: 36.9 C    Last Pain:  Vitals:   05/26/16 1019  TempSrc:   PainSc: 10-Worst pain ever      Patients Stated Pain Goal: 4 (59/47/07 6151)  Complications: No apparent anesthesia complications

## 2016-05-26 NOTE — Progress Notes (Addendum)
Pharmacy Antibiotic Note  Scott Hebert is a 56 y.o. male admitted on 05/26/2016 with right ankle osteomyelitis s/p I + D with wound VAC.  Pharmacy has been consulted for Vancomycin  dosing.  Plan: Vancomycin 1250 mg iv Q 12 hours Follow up Scr, cultures, plan  Height: 5\' 9"  (175.3 cm) Weight: 235 lb (106.6 kg) IBW/kg (Calculated) : 70.7  Temp (24hrs), Avg:98 F (36.7 C), Min:97.8 F (36.6 C), Max:98.4 F (36.9 C)   Recent Labs Lab 05/26/16 0946  WBC 6.9    CrCl cannot be calculated (Patient's most recent lab result is older than the maximum 21 days allowed.).    Allergies  Allergen Reactions  . Shellfish-Derived Products Anaphylaxis     Thank you for allowing pharmacy to be a part of this patient's care. Anette Guarneri, PharmD [phone] 05/26/2016 3:54 PM

## 2016-05-26 NOTE — OR Nursing (Signed)
Handoff report given to Katie RN over the telephone.  Scott Hebert is pain free and VSS.  He is resting comfortably in the bed.

## 2016-05-26 NOTE — Telephone Encounter (Signed)
Dr. Sharol Given advised this is ok for patient to continue with Bactrim since doxcycline caused GI upset. Patient is getting consulted with ID in the hospital, where they will manage IV abx medication, and will advise what he is to take orally as well.

## 2016-05-26 NOTE — H&P (Signed)
Scott Hebert is an 56 y.o. male.   Chief Complaint: Painful right ankle. HPI: Patient is a 56 year old gentleman who is status post open reduction internal fixation of a right ankle open fracture. Patient developed an infection over the fibular hardware. This was removed patient was placed on antibiotics and this is healed well. Patient presents at this time with pain anteriorly in the ankle. Aspiration showed a very small drop of fluid, cultures were positive for MRSA.  Past Medical History:  Diagnosis Date  . Allergy   . Arthritis   . GERD (gastroesophageal reflux disease)   . Infection    RLE    Past Surgical History:  Procedure Laterality Date  . HARDWARE REMOVAL Right 03/19/2016   Procedure: HARDWARE REMOVAL RIGHT ANKLE;  Surgeon: Newt Minion, MD;  Location: [facility];  Service: Orthopedics;  Laterality: Right;  . ORIF ANKLE FRACTURE Right 01/18/2016   Procedure: OPEN REDUCTION INTERNAL FIXATION (ORIF) ANKLE FRACTURE;  Surgeon: Newt Minion, MD;  Location: [facility];  Service: Orthopedics;  Laterality: Right;    Family History  Problem Relation Age of Onset  . Cancer Mother    Social History:  reports that he has been smoking Cigarettes.  He has a 2.50 pack-year smoking history. He has never used smokeless tobacco. He reports that he drinks alcohol. He reports that he does not use drugs.  Allergies:  Allergies  Allergen Reactions  . Shellfish-Derived Products Anaphylaxis    No prescriptions prior to admission.    No results found for this or any previous visit (from the past 48 hour(s)). No results found.  Review of Systems  All other systems reviewed and are negative.   Height 5\' 9"  (1.753 m), weight 235 lb (106.6 kg). Physical Exam  Examination patient has a good dorsalis pedis pulse he is tender to palpation anteriorly over the ankle. The fibula is minimally tender to palpation there is no drainage along the fibular incision there is no cellulitis. Patient is alert  oriented no adenopathy well-dressed normal affect normal respiratory effort he has an antalgic gait. Patient is a smoker. Assessment/Plan Assessment: Infected left ankle status post open reduction internal fixation for open ankle fracture status post debridement with excision of the fibular plate. Plan: We'll plan for repeat irrigation debridement revision debridement of the fibula arthroscopic debridement of the ankle plan for placement of an instillation wound VAC and initiation of long-term antibiotics with infectious disease consult.  Newt Minion, MD 05/26/2016, 6:46 AM

## 2016-05-27 ENCOUNTER — Encounter (HOSPITAL_COMMUNITY): Payer: Self-pay | Admitting: Orthopedic Surgery

## 2016-05-27 ENCOUNTER — Other Ambulatory Visit (INDEPENDENT_AMBULATORY_CARE_PROVIDER_SITE_OTHER): Payer: Self-pay | Admitting: Family

## 2016-05-27 LAB — BASIC METABOLIC PANEL
ANION GAP: 9 (ref 5–15)
BUN: 11 mg/dL (ref 6–20)
CALCIUM: 8.5 mg/dL — AB (ref 8.9–10.3)
CO2: 24 mmol/L (ref 22–32)
Chloride: 103 mmol/L (ref 101–111)
Creatinine, Ser: 0.89 mg/dL (ref 0.61–1.24)
GLUCOSE: 85 mg/dL (ref 65–99)
Potassium: 3.7 mmol/L (ref 3.5–5.1)
Sodium: 136 mmol/L (ref 135–145)

## 2016-05-27 LAB — SEDIMENTATION RATE: SED RATE: 74 mm/h — AB (ref 0–16)

## 2016-05-27 LAB — C-REACTIVE PROTEIN: CRP: 3.9 mg/dL — AB (ref <1.0)

## 2016-05-27 MED ORDER — PRO-STAT SUGAR FREE PO LIQD
30.0000 mL | Freq: Two times a day (BID) | ORAL | Status: DC
  Administered 2016-05-27 – 2016-06-02 (×10): 30 mL via ORAL
  Filled 2016-05-27 (×11): qty 30

## 2016-05-27 MED ORDER — NICOTINE 14 MG/24HR TD PT24
14.0000 mg | MEDICATED_PATCH | Freq: Every day | TRANSDERMAL | Status: DC
  Administered 2016-05-27 – 2016-06-02 (×7): 14 mg via TRANSDERMAL
  Filled 2016-05-27 (×7): qty 1

## 2016-05-27 NOTE — Progress Notes (Addendum)
Initial Nutrition Assessment  DOCUMENTATION CODES:   Obesity unspecified  INTERVENTION:    Continue Ensure Enlive po BID, each supplement provides 350 kcal and 20 grams of protein   Prostat liquid protein po 30 ml BID with meals, each supplement provides 100 kcal, 15 grams protein  NUTRITION DIAGNOSIS:   Increased nutrient needs related to wound healing as evidenced by estimated needs  GOAL:   Patient will meet greater than or equal to 90% of their needs  MONITOR:   PO intake, Supplement acceptance, Labs, Weight trends, Skin, I & O's  REASON FOR ASSESSMENT:   Malnutrition Screening Tool  ASSESSMENT:   56 yo Male who is status post open reduction internal fixation of a right ankle open fracture. Patient developed an infection over the fibular hardware. This was removed patient was placed on antibiotics and this is healed well. Patient presents at this time with pain anteriorly in the ankle. Aspiration showed a very small drop of fluid, cultures were positive for MRSA.   Pt s/p procedures 5/16: EXCISION PARTIAL R DISTAL FIBULA IRRIGATION AND DEBRIDEMENT OPEN R ANKLE APPLICATION OF INSTILLATION WOUND VAC  Pt reports a good appetite.  PO intake 100% at breakfast this AM. Wasn't consuming much PTA given pain medications.  He would sleep after taking his Oxycodone and not eat. His fiance had bought him Premier Protein shakes and "protein bars"; was eating and drinking. Also endorses a 13 lb weight loss (5%) in the last 3 weeks.  Significant for time frame. Labs reviewed.  Medications reviewed and include ABX and Reglan.  Nutrition focused physical exam completed.  No muscle or subcutaneous fat depletion noticed.  Diet Order:  Diet Carb Modified Fluid consistency: Thin; Room service appropriate? Yes  Skin:  Wound (see comment) (R ankle wound VAC)  Last BM:  5/15  Height:   Ht Readings from Last 1 Encounters:  05/26/16 5\' 9"  (1.753 m)   Weight:   Wt Readings from  Last 1 Encounters:  05/26/16 235 lb (106.6 kg)   Ideal Body Weight:  72.7 kg  BMI:  Body mass index is 34.7 kg/m.  Estimated Nutritional Needs:   Kcal:  2200-2400  Protein:  120-130 gm  Fluid:  2.2-2.4 L  EDUCATION NEEDS:   No education needs identified at this time  Arthur Holms, RD, LDN Pager #: [phone] After-Hours Pager #: [phone]

## 2016-05-27 NOTE — Evaluation (Signed)
Physical Therapy Evaluation Patient Details Name: Scott Hebert MRN: [id number] DOB: 1960-08-10 Today's Date: 05/27/2016   History of Present Illness  Pt c/o R foot pain secodnary to osteomeylitis of R ankle s/p I&D open R ankle 05/26/16. PMH ORIF of open R ankle fx 01/2016. dx with MRSA infextion over fibular hardware. Pt nicotine user and PMH of OA and GERD.  Clinical Impression  Patient is s/p above surgery resulting in functional limitations due to the deficits listed below (see PT Problem List). Pt mod I for bed mobility and min guard for transfers with RW and ambulation of 10 feet with RW. Patient will benefit from skilled PT to increase their independence and safety with mobility to allow discharge to the venue listed below.       Follow Up Recommendations Home health PT;Supervision/Assistance - 24 hour    Equipment Recommendations  3in1 (PT);Rolling walker with 5" wheels    Recommendations for Other Services       Precautions / Restrictions Precautions Precautions: Fall Precaution Comments: due to NWB status Restrictions Weight Bearing Restrictions: Yes RLE Weight Bearing: Non weight bearing      Mobility  Bed Mobility Overal bed mobility: Modified Independent             General bed mobility comments: used bedrails to come to EoB  Transfers Overall transfer level: Needs assistance Equipment used: Rolling walker (2 wheeled) Transfers: Sit to/from Stand Sit to Stand: Min guard         General transfer comment: min guard for safety vc for hand placement for power up  Ambulation/Gait Ambulation/Gait assistance: Min guard Ambulation Distance (Feet): 10 Feet Assistive device: Rolling walker (2 wheeled) Gait Pattern/deviations:  (hop to pattern) Gait velocity: slowed Gait velocity interpretation: Below normal speed for age/gender General Gait Details: vc to stay inside the walker         Balance Overall balance assessment: Needs  assistance Sitting-balance support: No upper extremity supported;Feet unsupported Sitting balance-Leahy Scale: Normal     Standing balance support: Bilateral upper extremity supported Standing balance-Leahy Scale: Fair Standing balance comment: requires RW to maintain balance                              Pertinent Vitals/Pain Pain Assessment: 0-10 Pain Score: 5  Pain Location: R foot Pain Descriptors / Indicators: Throbbing;Sharp;Discomfort Pain Intervention(s): Monitored during session;Limited activity within patient's tolerance;Repositioned  VSS    Home Living Family/patient expects to be discharged to:: Private residence Living Arrangements: Spouse/significant other Available Help at Discharge: Family;Friend(s);Available 24 hours/day Type of Home: House Home Access: Stairs to enter Entrance Stairs-Rails: Can reach both Entrance Stairs-Number of Steps: 5 Home Layout: One level Home Equipment: Crutches;Cane - single point      Prior Function Level of Independence: Independent with assistive device(s)         Comments: household ambulator, use crutches to get around     Hand Dominance        Extremity/Trunk Assessment   Upper Extremity Assessment Upper Extremity Assessment: Overall WFL for tasks assessed    Lower Extremity Assessment Lower Extremity Assessment: RLE deficits/detail RLE Deficits / Details: R ankle ROM and strength limited by surgical intervenetion  RLE: Unable to fully assess due to immobilization    Cervical / Trunk Assessment Cervical / Trunk Assessment: Normal  Communication   Communication: No difficulties  Cognition Arousal/Alertness: Awake/alert Behavior During Therapy: WFL for tasks assessed/performed Overall Cognitive Status: Within  Functional Limits for tasks assessed                                        General Comments General comments (skin integrity, edema, etc.): Pt fiance in room at end of  session    Exercises General Exercises - Lower Extremity Ankle Circles/Pumps: Both;10 reps;Seated;AROM Quad Sets: Right;10 reps;Seated;AROM Gluteal Sets: AROM;Both;10 reps;Seated Heel Slides: AROM;Right;10 reps;Supine Hip ABduction/ADduction: AROM;Right;10 reps;Supine Straight Leg Raises: AROM;Right;10 reps;Supine   Assessment/Plan    PT Assessment Patient needs continued PT services  PT Problem List Decreased strength;Decreased range of motion;Decreased activity tolerance;Decreased balance;Decreased mobility;Decreased knowledge of use of DME;Pain       PT Treatment Interventions DME instruction;Gait training;Stair training;Functional mobility training;Therapeutic activities;Therapeutic exercise;Balance training;Patient/family education    PT Goals (Current goals can be found in the Care Plan section)  Acute Rehab PT Goals Patient Stated Goal: go home and never come back PT Goal Formulation: With patient Time For Goal Achievement: 06/10/16 Potential to Achieve Goals: Good    Frequency Min 5X/week    AM-PAC PT "6 Clicks" Daily Activity  Outcome Measure Difficulty turning over in bed (including adjusting bedclothes, sheets and blankets)?: A Little Difficulty moving from lying on back to sitting on the side of the bed? : A Little Difficulty sitting down on and standing up from a chair with arms (e.g., wheelchair, bedside commode, etc,.)?: Total Help needed moving to and from a bed to chair (including a wheelchair)?: A Little Help needed walking in hospital room?: A Little Help needed climbing 3-5 steps with a railing? : Total 6 Click Score: 14    End of Session Equipment Utilized During Treatment: Gait belt Activity Tolerance: Patient tolerated treatment well Patient left: in chair;with call bell/phone within reach;with family/visitor present Nurse Communication: Mobility status;Precautions;Weight bearing status PT Visit Diagnosis: Unsteadiness on feet (R26.81);Other  abnormalities of gait and mobility (R26.89);Muscle weakness (generalized) (M62.81);Pain Pain - Right/Left: Right Pain - part of body: Ankle and joints of foot    Time: 7829-5621 PT Time Calculation (min) (ACUTE ONLY): 37 min   Charges:   PT Evaluation $PT Eval Low Complexity: 1 Procedure PT Treatments $Gait Training: 8-22 mins   PT G Codes:        Marygrace Sandoval B. Migdalia Dk PT, DPT Acute Rehabilitation  [phone] Pager [phone]    [place] 05/27/2016, 4:54 PM

## 2016-05-27 NOTE — Progress Notes (Signed)
Subjective: 1 Day Post-Op Procedure(s) (LRB): Irrigation and Debridement Right Fibula and Arthroscopic Debridement Right Ankle, Apply Wound VAC (Right)  No concerns of pain voiced.   Objective:   Body mass index is 34.7 kg/m.  VITALS:  Temp:  [97.8 F (36.6 C)-99 F (37.2 C)] 98.1 F (36.7 C) (05/17 0554) Pulse Rate:  [85-107] 87 (05/17 0554) Resp:  [14-25] 18 (05/17 0554) BP: (110-155)/(76-96) 155/81 (05/17 0554) SpO2:  [92 %-100 %] 98 % (05/16 2300) Weight:  [235 lb (106.6 kg)] 235 lb (106.6 kg) (05/16 1019)   Physical Exam  Constitutional: He is oriented to person, place, and time and well-developed, well-nourished, and in no distress.  Musculoskeletal: He exhibits no edema.  Instillation Wound vac with good seal to fit  Neurological: He is alert and oriented to person, place, and time.  Skin: Skin is warm and dry.  Psychiatric: Affect normal.  Nursing note reviewed.    LABS  Recent Labs  05/26/16 0946  HGB 11.3*  WBC 6.9  PLT 330   No results for input(s): NA, K, CL, CO2, BUN, CREATININE, GLUCOSE in the last 72 hours. No results for input(s): LABPT, INR in the last 72 hours.   Assessment/Plan:  plan to continue instillation wound VAC.  Antibiotics: Vancomycin per infectious disease, holding on PICC for cultures Plan to return to OR for ankle fusion monday  Continue ABX therapy due to Culture taken of surgical site during joint revision  Author: Dondra Prader, FNP 05/27/2016 9:41 AM

## 2016-05-27 NOTE — Plan of Care (Signed)
Problem: Education: Goal: Knowledge of [facility] Education information/materials will improve Outcome: Progressing POC reviewed with pt./wife.   

## 2016-05-28 MED ORDER — DIPHENHYDRAMINE HCL 25 MG PO CAPS
25.0000 mg | ORAL_CAPSULE | Freq: Three times a day (TID) | ORAL | Status: DC | PRN
  Administered 2016-05-28 – 2016-05-30 (×2): 25 mg via ORAL
  Filled 2016-05-28 (×2): qty 1

## 2016-05-28 NOTE — Progress Notes (Signed)
IV to left forearm needing to be restarted. IV nurse in to restart IV and noted the order placed today at 1429  for PICC line. Patient states he knows nothing about a PICC line and feels like he should have a choice per pt.  ID note reflects PICC line placed today. IV nurse will inquire and inform RN.

## 2016-05-28 NOTE — Progress Notes (Signed)
Physical Therapy Treatment Patient Details Name: Scott Hebert MRN: [id number] DOB: 1960/02/28 Today's Date: 05/28/2016    History of Present Illness Pt c/o R foot pain secodnary to osteomeylitis of R ankle s/p I&D open R ankle 05/26/16. PMH ORIF of open R ankle fx 01/2016. dx with MRSA infextion over fibular hardware. Pt nicotine user and PMH of OA and GERD.    PT Comments    Pt is making good progress towards his goals. Pt currently mod I for bed mobility, min guard for transfers to RW and min guard for ambulation of 5 feet with RW. Pt had been educated on HEP to keep his R hip and R knee strong while he has weightbearing precautions on his R LE after surgery on Monday. Pt requires skilled PT to continue to progress gait training and to maintain UE and LE strength and endurance to safely mobilize in his home environment at discharge.    Follow Up Recommendations  Home health PT;Supervision/Assistance - 24 hour     Equipment Recommendations  3in1 (PT);Rolling walker with 5" wheels       Precautions / Restrictions Precautions Precautions: Fall Precaution Comments: due to NWB status Restrictions Weight Bearing Restrictions: Yes RLE Weight Bearing: Non weight bearing    Mobility  Bed Mobility Overal bed mobility: Modified Independent             General bed mobility comments: used bedrails to come to EoB  Transfers Overall transfer level: Needs assistance Equipment used: Rolling walker (2 wheeled) Transfers: Sit to/from Stand Sit to Stand: Min guard         General transfer comment: min guard for safety vc for hand placement for power up  Ambulation/Gait Ambulation/Gait assistance: Min guard Ambulation Distance (Feet): 5 Feet Assistive device: Rolling walker (2 wheeled) Gait Pattern/deviations:  (hop to pattern) Gait velocity: slowed Gait velocity interpretation: Below normal speed for age/gender General Gait Details: vc to stay inside the walker         Balance Overall balance assessment: Needs assistance Sitting-balance support: No upper extremity supported;Feet unsupported Sitting balance-Leahy Scale: Normal     Standing balance support: Bilateral upper extremity supported Standing balance-Leahy Scale: Good Standing balance comment: steady in upright with single UE support to steady                            Cognition Arousal/Alertness: Awake/alert Behavior During Therapy: WFL for tasks assessed/performed Overall Cognitive Status: Within Functional Limits for tasks assessed                                        Exercises Total Joint Exercises Hip ABduction/ADduction: AROM;Right;10 reps;Standing Long Arc Quad: AROM;Right;10 reps;Seated Knee Flexion: AROM;Right;10 reps;Seated Marching in Standing: AROM;Right;10 reps;Standing Standing Hip Extension: AROM;10 reps;Right;Standing General Exercises - Lower Extremity Ankle Circles/Pumps: Both;10 reps;Seated;AROM Quad Sets: Right;10 reps;Seated;AROM Gluteal Sets: AROM;Both;10 reps;Seated Short Arc Quad: AROM;Right;10 reps;Supine Heel Slides: AROM;Right;10 reps;Supine Hip ABduction/ADduction: AROM;Right;10 reps;Supine Straight Leg Raises: AROM;Right;10 reps;Supine Hip Flexion/Marching: AROM;Right;10 reps;Standing        Pertinent Vitals/Pain Pain Assessment: 0-10 Pain Score: 7  Pain Location: R foot Pain Descriptors / Indicators: Throbbing;Sharp;Discomfort Pain Intervention(s): Monitored during session;RN gave pain meds during session           PT Goals (current goals can now be found in the care plan section) Acute Rehab PT Goals  Patient Stated Goal: go home and never come back PT Goal Formulation: With patient Time For Goal Achievement: 06/10/16 Potential to Achieve Goals: Good Progress towards PT goals: Progressing toward goals    Frequency    Min 5X/week      PT Plan         AM-PAC PT "6 Clicks" Daily Activity  Outcome  Measure  Difficulty turning over in bed (including adjusting bedclothes, sheets and blankets)?: A Little Difficulty moving from lying on back to sitting on the side of the bed? : A Little Difficulty sitting down on and standing up from a chair with arms (e.g., wheelchair, bedside commode, etc,.)?: A Lot Help needed moving to and from a bed to chair (including a wheelchair)?: A Little Help needed walking in hospital room?: A Little Help needed climbing 3-5 steps with a railing? : Total 6 Click Score: 15    End of Session Equipment Utilized During Treatment: Gait belt Activity Tolerance: Patient tolerated treatment well Patient left: in chair;with call bell/phone within reach;with family/visitor present Nurse Communication: Mobility status;Precautions;Weight bearing status PT Visit Diagnosis: Unsteadiness on feet (R26.81);Other abnormalities of gait and mobility (R26.89);Muscle weakness (generalized) (M62.81);Pain Pain - Right/Left: Right Pain - part of body: Ankle and joints of foot     Time: 6468-0321 PT Time Calculation (min) (ACUTE ONLY): 33 min  Charges:  $Therapeutic Exercise: 23-37 mins                    G Codes:       Jadaya Sommerfield B. Migdalia Dk PT, DPT Acute Rehabilitation  [phone] Pager [phone]     [place] 05/28/2016, 5:28 PM

## 2016-05-28 NOTE — Progress Notes (Signed)
ID progress note:   - continues to be afebrile.  - blood cx ngtd at 48hr - kidney function stable on vancomycin  -plan:  Get picc line placed today - fusion on Monday - discharge with plan for 6 wk of IV vancomycin  Dr comer available for questions. Will see back on monday

## 2016-05-28 NOTE — Progress Notes (Addendum)
Subjective: 2 Days Post-Op Procedure(s) (LRB): Irrigation and Debridement Right Fibula and Arthroscopic Debridement Right Ankle, Apply Wound VAC (Right)  No concerns of pain voiced. Has discussed the surgical plan with Dr. Sharol Given. Is agreeable with plan for fusion on Monday.   Objective:   Body mass index is 34.7 kg/m.  VITALS:  Temp:  [98.5 F (36.9 C)-99 F (37.2 C)] 99 F (37.2 C) (05/18 0336) Pulse Rate:  [82-88] 86 (05/18 0336) Resp:  [16-20] 20 (05/18 0336) BP: (139-153)/(89-95) 153/92 (05/18 0336) SpO2:  [97 %-100 %] 97 % (05/18 0336)   Physical Exam  Constitutional: He is oriented to person, place, and time and well-developed, well-nourished, and in no distress.  Musculoskeletal: He exhibits no edema.  Instillation Wound vac with good seal to fit  Neurological: He is alert and oriented to person, place, and time.  Skin: Skin is warm and dry.  Psychiatric: Affect normal.  Nursing note reviewed.    LABS  Recent Labs  05/26/16 0946  HGB 11.3*  WBC 6.9  PLT 330    Recent Labs  05/27/16 1011  NA 136  K 3.7  CL 103  CO2 24  BUN 11  CREATININE 0.89  GLUCOSE 85   No results for input(s): LABPT, INR in the last 72 hours.   Assessment/Plan:  Plan to continue instillation wound VAC.  Antibiotics: Vancomycin per infectious disease, holding on PICC for cultures Plan to return to OR for ankle fusion monday    Author: Dondra Prader, [place] 05/28/2016 9:36 AM   [phone]

## 2016-05-28 NOTE — Progress Notes (Signed)
[facility] hospital infusion coordinator is following Scott Hebert to support home IV ABX as pharmacy provider upon DC to home.  If patient discharges after hours, please call [phone].   [name] 05/28/2016, 2:15 PM

## 2016-05-29 LAB — VANCOMYCIN, TROUGH: VANCOMYCIN TR: 14 ug/mL — AB (ref 15–20)

## 2016-05-29 LAB — BASIC METABOLIC PANEL
ANION GAP: 9 (ref 5–15)
BUN: 14 mg/dL (ref 6–20)
CO2: 23 mmol/L (ref 22–32)
Calcium: 8.8 mg/dL — ABNORMAL LOW (ref 8.9–10.3)
Chloride: 102 mmol/L (ref 101–111)
Creatinine, Ser: 0.83 mg/dL (ref 0.61–1.24)
GFR calc Af Amer: >60 mL/min (ref >60)
GFR calc non Af Amer: >60 mL/min (ref >60)
GLUCOSE: 96 mg/dL (ref 65–99)
Potassium: 3.8 mmol/L (ref 3.5–5.1)
Sodium: 134 mmol/L — ABNORMAL LOW (ref 135–145)

## 2016-05-29 MED ORDER — SODIUM CHLORIDE 0.9 % IV SOLN
1500.0000 mg | Freq: Two times a day (BID) | INTRAVENOUS | Status: DC
Start: 2016-05-29 — End: 2016-06-02
  Administered 2016-05-29 – 2016-06-02 (×9): 1500 mg via INTRAVENOUS
  Filled 2016-05-29 (×11): qty 1500

## 2016-05-29 NOTE — Progress Notes (Signed)
Spoke with pt and wife at length regarding PICC placement.  Pt pleasantly refuses until able to speak with Dr Sharol Given on Monday. States Dr Sharol Given said po meds at d/c, and that Dr Baxter Flattery never discussed  a PICC with them.  Please reorder this  PICC once he has spoken to Dr Sharol Given and is agreeable to sign.

## 2016-05-29 NOTE — Progress Notes (Signed)
Subjective: 3 Days Post-Op Procedure(s) (LRB): Irrigation and Debridement Right Fibula and Arthroscopic Debridement Right Ankle, Apply Wound VAC (Right) Patient reports pain as mild.    Objective: Vital signs in last 24 hours: Temp:  [98.2 F (36.8 C)-98.7 F (37.1 C)] 98.2 F (36.8 C) (05/19 0700) Pulse Rate:  [69-81] 69 (05/19 0700) Resp:  [18] 18 (05/18 1353) BP: (130-139)/(62-97) 139/97 (05/19 0700) SpO2:  [99 %-100 %] 99 % (05/19 0700)  Intake/Output from previous day: 05/18 0701 - 05/19 0700 In: 600 [P.O.:600] Out: 30 [Drains:30] Intake/Output this shift: No intake/output data recorded.  No results for input(s): HGB in the last 72 hours. No results for input(s): WBC, RBC, HCT, PLT in the last 72 hours.  Recent Labs  05/27/16 1011 05/29/16 0535  NA 136 134*  K 3.7 3.8  CL 103 102  CO2 24 23  BUN 11 14  CREATININE 0.89 0.83  GLUCOSE 85 96  CALCIUM 8.5* 8.8*   No results for input(s): LABPT, INR in the last 72 hours.  Neurovascular intact  Assessment/Plan: 3 Days Post-Op Procedure(s) (LRB): Irrigation and Debridement Right Fibula and Arthroscopic Debridement Right Ankle, Apply Wound VAC (Right) Followed by ID with IV vanco, comfortable without complaints, awaiting further surgery Mon  Garald Balding 05/29/2016, 10:25 AM

## 2016-05-29 NOTE — Progress Notes (Signed)
Pharmacy Antibiotic Note  Scott Hebert is a 56 y.o. male admitted on 05/26/2016 with MRSA osteomyelitis/septic arthritis.  Pharmacy has been consulted for vancomycin dosing.  Plan: Change vancomycin to 1500mg  IV every 12 hours.  Goal trough 15-20 mcg/mL. (calculated trough ~17)  Height: 5\' 9"  (175.3 cm) Weight: 235 lb (106.6 kg) IBW/kg (Calculated) : 70.7  Temp (24hrs), Avg:98.5 F (36.9 C), Min:98.3 F (36.8 C), Max:98.7 F (37.1 C)   Recent Labs Lab 05/26/16 0946 05/27/16 1011 05/29/16 0535  WBC 6.9  --   --   CREATININE  --  0.89 0.83  VANCOTROUGH  --   --  14*    Estimated Creatinine Clearance: 119.6 mL/min (by C-G formula based on SCr of 0.83 mg/dL).    Allergies  Allergen Reactions  . Shellfish-Derived Products Anaphylaxis    Antimicrobials this admission: Vanc 5/16 >>  Dose adjustments this admission: Vanc 1250mg  Q12H >> trough 14 >> inc vanc 1500mg  Q12H  Microbiology results: 5/11 arthocentesis cx PTA - MRSA 5/16 ankle abscess - MRSA 5/16 surgical screen - negative 5/16 blood - pending  Thank you for allowing pharmacy to be a part of this patient's care.  Wynona Neat, PharmD, BCPS  05/29/2016 7:00 AM

## 2016-05-29 NOTE — Progress Notes (Signed)
Spoke with RN, states per report,the pt is hesitant to have PICC placed.  Will speak  with pt regarding home antibiotics and need.

## 2016-05-30 MED ORDER — CHLORHEXIDINE GLUCONATE 4 % EX LIQD: 60.0000 mL | Freq: Once | CUTANEOUS | Status: DC

## 2016-05-30 MED ORDER — MAGNESIUM CITRATE PO SOLN
1.0000 | Freq: Once | ORAL | Status: AC
  Administered 2016-05-30: 1 via ORAL
  Filled 2016-05-30: qty 296

## 2016-05-30 MED ORDER — CEFAZOLIN SODIUM-DEXTROSE 2-4 GM/100ML-% IV SOLN: 2.0000 g | INTRAVENOUS | Status: DC

## 2016-05-30 NOTE — Consult Note (Signed)
[facility] Nurse wound consult note Reason for Consult: Management of instillation negative pressure wound therapy Patient not seen today; Dr. Durward Fortes and NP Coralyn Pear note that patient is scheduled for OR tomorrow with Dr. Sharol Given. [place] nursing team will not follow, but will remain available to this patient, the nursing and medical teams.  Please re-consult if needed. Thanks, Maudie Flakes, MSN, RN, [place], Arther Abbott  Pager# [phone]

## 2016-05-30 NOTE — Progress Notes (Signed)
Patient have had several iv's causing pain and swelling when the Vancomycin is running at rate on bag (200 or 250), please run at lower rate 125-175.  It is less painful for him, no swelling and his new iv lasted through out this shift.

## 2016-05-30 NOTE — Progress Notes (Signed)
Subjective: 4 Days Post-Op Procedure(s) (LRB): Irrigation and Debridement Right Fibula and Arthroscopic Debridement Right Ankle, Apply Wound VAC (Right) Patient reports pain as mild.    Objective: Vital signs in last 24 hours: Temp:  [97.8 F (36.6 C)-98.4 F (36.9 C)] 98.3 F (36.8 C) (05/20 0507) Pulse Rate:  [69-91] 69 (05/20 0507) Resp:  [17-18] 18 (05/20 0507) BP: (121-147)/(74-100) 121/74 (05/20 0507) SpO2:  [93 %-100 %] 100 % (05/20 0507)  Intake/Output from previous day: 05/19 0701 - 05/20 0700 In: 360 [P.O.:360] Out: 350 [Urine:200; Drains:150] Intake/Output this shift: No intake/output data recorded.  No results for input(s): HGB in the last 72 hours. No results for input(s): WBC, RBC, HCT, PLT in the last 72 hours.  Recent Labs  05/29/16 0535  NA 134*  K 3.8  CL 102  CO2 23  BUN 14  CREATININE 0.83  GLUCOSE 96  CALCIUM 8.8*   No results for input(s): LABPT, INR in the last 72 hours.  Compartment soft,no pain  Assessment/Plan: 4 Days Post-Op Procedure(s) (LRB): Irrigation and Debridement Right Fibula and Arthroscopic Debridement Right Ankle, Apply Wound VAC (Right) Plan to return to OR tomorrow for repeat I and D, possible fusion per Dr Christena Flake  05/30/2016, 11:27 AM

## 2016-05-31 ENCOUNTER — Inpatient Hospital Stay (HOSPITAL_COMMUNITY): Admitting: Anesthesiology

## 2016-05-31 ENCOUNTER — Encounter (HOSPITAL_COMMUNITY)
Admission: RE | Disposition: A | Discharge status: Home Health | Payer: Self-pay | Source: Ambulatory Visit | Attending: Orthopedic Surgery

## 2016-05-31 ENCOUNTER — Other Ambulatory Visit (INDEPENDENT_AMBULATORY_CARE_PROVIDER_SITE_OTHER): Payer: Self-pay | Admitting: Orthopedic Surgery

## 2016-05-31 DIAGNOSIS — M868X6 Other osteomyelitis, lower leg: Secondary | ICD-10-CM

## 2016-05-31 DIAGNOSIS — M00071 Staphylococcal arthritis, right ankle and foot: Principal | ICD-10-CM

## 2016-05-31 DIAGNOSIS — Z5181 Encounter for therapeutic drug level monitoring: Secondary | ICD-10-CM

## 2016-05-31 HISTORY — PX: ANKLE FUSION: SHX5718

## 2016-05-31 LAB — AEROBIC/ANAEROBIC CULTURE (SURGICAL/DEEP WOUND)

## 2016-05-31 LAB — BASIC METABOLIC PANEL
Anion gap: 7 (ref 5–15)
BUN: 20 mg/dL (ref 6–20)
CALCIUM: 8.4 mg/dL — AB (ref 8.9–10.3)
CHLORIDE: 104 mmol/L (ref 101–111)
CO2: 26 mmol/L (ref 22–32)
CREATININE: 0.96 mg/dL (ref 0.61–1.24)
Glucose, Bld: 102 mg/dL — ABNORMAL HIGH (ref 65–99)
Potassium: 4.2 mmol/L (ref 3.5–5.1)
Sodium: 137 mmol/L (ref 135–145)

## 2016-05-31 LAB — AEROBIC/ANAEROBIC CULTURE W GRAM STAIN (SURGICAL/DEEP WOUND)

## 2016-05-31 SURGERY — ANKLE FUSION
Anesthesia: General | Laterality: Right

## 2016-05-31 MED ORDER — SODIUM CHLORIDE 0.9 % IR SOLN
Status: DC | PRN
  Administered 2016-05-31: 3000 mL

## 2016-05-31 MED ORDER — FENTANYL CITRATE (PF) 250 MCG/5ML IJ SOLN
INTRAMUSCULAR | Status: AC
  Filled 2016-05-31: qty 5

## 2016-05-31 MED ORDER — FENTANYL CITRATE (PF) 100 MCG/2ML IJ SOLN
INTRAMUSCULAR | Status: DC | PRN
  Administered 2016-05-31 (×3): 50 ug via INTRAVENOUS
  Administered 2016-05-31: 100 ug via INTRAVENOUS

## 2016-05-31 MED ORDER — LIDOCAINE 2% (20 MG/ML) 5 ML SYRINGE
INTRAMUSCULAR | Status: DC | PRN
  Administered 2016-05-31: 60 mg via INTRAVENOUS

## 2016-05-31 MED ORDER — HYDROMORPHONE HCL 1 MG/ML IJ SOLN
INTRAMUSCULAR | Status: AC
  Filled 2016-05-31: qty 1

## 2016-05-31 MED ORDER — ONDANSETRON HCL 4 MG/2ML IJ SOLN
INTRAMUSCULAR | Status: DC | PRN
  Administered 2016-05-31: 4 mg via INTRAVENOUS

## 2016-05-31 MED ORDER — POVIDONE-IODINE 10 % EX SWAB: 2.0000 "application " | Freq: Once | CUTANEOUS | Status: DC

## 2016-05-31 MED ORDER — HYDROMORPHONE HCL 1 MG/ML IJ SOLN
INTRAMUSCULAR | Status: DC | PRN
  Administered 2016-05-31: 1 mg via INTRAVENOUS

## 2016-05-31 MED ORDER — METHOCARBAMOL 500 MG PO TABS
500.0000 mg | ORAL_TABLET | Freq: Four times a day (QID) | ORAL | Status: DC | PRN
  Administered 2016-06-01 – 2016-06-02 (×6): 500 mg via ORAL
  Filled 2016-05-31 (×6): qty 1

## 2016-05-31 MED ORDER — OXYCODONE HCL 5 MG PO TABS
ORAL_TABLET | ORAL | Status: AC
Start: 2016-05-31 — End: 2016-05-31
  Administered 2016-05-31: 10 mg via ORAL
  Filled 2016-05-31: qty 2

## 2016-05-31 MED ORDER — SODIUM CHLORIDE 0.9 % IV SOLN
INTRAVENOUS | Status: DC
  Administered 2016-05-31: via INTRAVENOUS

## 2016-05-31 MED ORDER — HYDROMORPHONE HCL 1 MG/ML IJ SOLN
INTRAMUSCULAR | Status: AC
  Administered 2016-05-31: 0.5 mg via INTRAVENOUS
  Filled 2016-05-31: qty 1

## 2016-05-31 MED ORDER — SODIUM CHLORIDE 0.9% FLUSH
10.0000 mL | INTRAVENOUS | Status: DC | PRN
  Administered 2016-06-02: 20 mL
  Filled 2016-05-31: qty 40

## 2016-05-31 MED ORDER — ACETAMINOPHEN 325 MG PO TABS
650.0000 mg | ORAL_TABLET | Freq: Four times a day (QID) | ORAL | Status: DC | PRN
  Administered 2016-06-01 – 2016-06-02 (×3): 650 mg via ORAL
  Filled 2016-05-31 (×3): qty 2

## 2016-05-31 MED ORDER — DOCUSATE SODIUM 100 MG PO CAPS
100.0000 mg | ORAL_CAPSULE | Freq: Two times a day (BID) | ORAL | Status: DC
  Administered 2016-06-01 – 2016-06-02 (×3): 100 mg via ORAL
  Filled 2016-05-31 (×4): qty 1

## 2016-05-31 MED ORDER — ONDANSETRON HCL 4 MG/2ML IJ SOLN
INTRAMUSCULAR | Status: AC
  Filled 2016-05-31: qty 4

## 2016-05-31 MED ORDER — MIDAZOLAM HCL 5 MG/5ML IJ SOLN
INTRAMUSCULAR | Status: DC | PRN
  Administered 2016-05-31: 2 mg via INTRAVENOUS

## 2016-05-31 MED ORDER — METOCLOPRAMIDE HCL 5 MG PO TABS: 5.0000 mg | ORAL_TABLET | Freq: Three times a day (TID) | ORAL | Status: DC | PRN

## 2016-05-31 MED ORDER — POLYETHYLENE GLYCOL 3350 17 G PO PACK: 17.0000 g | PACK | Freq: Every day | ORAL | Status: DC | PRN

## 2016-05-31 MED ORDER — ONDANSETRON HCL 4 MG/2ML IJ SOLN: 4.0000 mg | Freq: Four times a day (QID) | INTRAMUSCULAR | Status: DC | PRN

## 2016-05-31 MED ORDER — PROPOFOL 10 MG/ML IV BOLUS
INTRAVENOUS | Status: AC
  Filled 2016-05-31: qty 20

## 2016-05-31 MED ORDER — METOCLOPRAMIDE HCL 5 MG/ML IJ SOLN: 5.0000 mg | Freq: Three times a day (TID) | INTRAMUSCULAR | Status: DC | PRN

## 2016-05-31 MED ORDER — ONDANSETRON HCL 4 MG PO TABS: 4.0000 mg | ORAL_TABLET | Freq: Four times a day (QID) | ORAL | Status: DC | PRN

## 2016-05-31 MED ORDER — OXYCODONE HCL 5 MG PO TABS
5.0000 mg | ORAL_TABLET | ORAL | Status: DC | PRN
  Administered 2016-05-31 – 2016-06-02 (×10): 10 mg via ORAL
  Filled 2016-05-31 (×9): qty 2

## 2016-05-31 MED ORDER — HYDROMORPHONE HCL 1 MG/ML IJ SOLN
1.0000 mg | INTRAMUSCULAR | Status: DC | PRN
  Administered 2016-05-31 – 2016-06-02 (×7): 1 mg via INTRAVENOUS
  Filled 2016-05-31 (×7): qty 1

## 2016-05-31 MED ORDER — LACTATED RINGERS IV SOLN
INTRAVENOUS | Status: DC
  Administered 2016-05-31 (×3): via INTRAVENOUS

## 2016-05-31 MED ORDER — PHENYLEPHRINE 40 MCG/ML (10ML) SYRINGE FOR IV PUSH (FOR BLOOD PRESSURE SUPPORT)
PREFILLED_SYRINGE | INTRAVENOUS | Status: AC
  Filled 2016-05-31: qty 10

## 2016-05-31 MED ORDER — CHLORHEXIDINE GLUCONATE 4 % EX LIQD
60.0000 mL | Freq: Once | CUTANEOUS | Status: AC
  Administered 2016-05-31: 4 via TOPICAL
  Filled 2016-05-31: qty 60

## 2016-05-31 MED ORDER — BISACODYL 10 MG RE SUPP: 10.0000 mg | Freq: Every day | RECTAL | Status: DC | PRN

## 2016-05-31 MED ORDER — ACETAMINOPHEN 650 MG RE SUPP: 650.0000 mg | Freq: Four times a day (QID) | RECTAL | Status: DC | PRN

## 2016-05-31 MED ORDER — METHOCARBAMOL 1000 MG/10ML IJ SOLN
500.0000 mg | Freq: Four times a day (QID) | INTRAVENOUS | Status: DC | PRN
  Filled 2016-05-31: qty 5

## 2016-05-31 MED ORDER — PROPOFOL 10 MG/ML IV BOLUS
INTRAVENOUS | Status: DC | PRN
  Administered 2016-05-31: 150 mg via INTRAVENOUS

## 2016-05-31 MED ORDER — LIDOCAINE 2% (20 MG/ML) 5 ML SYRINGE
INTRAMUSCULAR | Status: AC
  Filled 2016-05-31: qty 10

## 2016-05-31 MED ORDER — HYDROMORPHONE HCL 1 MG/ML IJ SOLN
0.2500 mg | INTRAMUSCULAR | Status: DC | PRN
  Administered 2016-05-31 (×3): 0.5 mg via INTRAVENOUS

## 2016-05-31 MED ORDER — MIDAZOLAM HCL 2 MG/2ML IJ SOLN
INTRAMUSCULAR | Status: AC
  Filled 2016-05-31: qty 2

## 2016-05-31 MED ORDER — MAGNESIUM CITRATE PO SOLN
1.0000 | Freq: Once | ORAL | Status: AC | PRN
  Administered 2016-06-01: 1 via ORAL
  Filled 2016-05-31: qty 296

## 2016-05-31 MED ORDER — METHOCARBAMOL 500 MG PO TABS
ORAL_TABLET | ORAL | Status: AC
  Administered 2016-05-31: 500 mg
  Filled 2016-05-31: qty 1

## 2016-05-31 SURGICAL SUPPLY — 40 items
BANDAGE ESMARK 6X9 LF (GAUZE/BANDAGES/DRESSINGS) ×1 IMPLANT
BLADE SAW SGTL HD 18.5X60.5X1. (BLADE) ×2 IMPLANT
BLADE SAW SGTL MED 73X18.5 STR (BLADE) ×2 IMPLANT
BLADE SURG 10 STRL SS (BLADE) IMPLANT
BNDG COHESIVE 4X5 TAN STRL (GAUZE/BANDAGES/DRESSINGS) ×2 IMPLANT
BNDG ESMARK 6X9 LF (GAUZE/BANDAGES/DRESSINGS) ×2
BNDG GAUZE ELAST 4 BULKY (GAUZE/BANDAGES/DRESSINGS) ×4 IMPLANT
COVER MAYO STAND STRL (DRAPES) IMPLANT
COVER SURGICAL LIGHT HANDLE (MISCELLANEOUS) ×4 IMPLANT
DRAPE OEC MINIVIEW 54X84 (DRAPES) ×2 IMPLANT
DRAPE U-SHAPE 47X51 STRL (DRAPES) ×2 IMPLANT
DRSG ADAPTIC 3X8 NADH LF (GAUZE/BANDAGES/DRESSINGS) ×2 IMPLANT
DURAPREP 26ML APPLICATOR (WOUND CARE) ×2 IMPLANT
ELECT REM PT RETURN 9FT ADLT (ELECTROSURGICAL) ×2
ELECTRODE REM PT RTRN 9FT ADLT (ELECTROSURGICAL) ×1 IMPLANT
GAUZE SPONGE 4X4 12PLY STRL (GAUZE/BANDAGES/DRESSINGS) ×2 IMPLANT
GLOVE BIOGEL PI IND STRL 9 (GLOVE) ×1 IMPLANT
GLOVE BIOGEL PI INDICATOR 9 (GLOVE) ×1
GLOVE SURG ORTHO 9.0 STRL STRW (GLOVE) ×2 IMPLANT
GOWN STRL REUS W/ TWL LRG LVL3 (GOWN DISPOSABLE) ×1 IMPLANT
GOWN STRL REUS W/ TWL XL LVL3 (GOWN DISPOSABLE) ×1 IMPLANT
GOWN STRL REUS W/TWL LRG LVL3 (GOWN DISPOSABLE) ×1
GOWN STRL REUS W/TWL XL LVL3 (GOWN DISPOSABLE) ×1
GUIDEWIRE 1.6 (WIRE) ×2
GUIDEWIRE ORTH 220X1.6XTROC (WIRE) ×2 IMPLANT
KIT BASIN OR (CUSTOM PROCEDURE TRAY) ×2 IMPLANT
KIT PREVENA INCISION MGT20CM45 (CANNISTER) ×2 IMPLANT
KIT ROOM TURNOVER OR (KITS) ×2 IMPLANT
NS IRRIG 1000ML POUR BTL (IV SOLUTION) ×2 IMPLANT
PACK ORTHO EXTREMITY (CUSTOM PROCEDURE TRAY) ×2 IMPLANT
PAD ARMBOARD 7.5X6 YLW CONV (MISCELLANEOUS) ×4 IMPLANT
SCREW COMPRESSION 6.5X45MM (Screw) ×4 IMPLANT
SPONGE LAP 18X18 X RAY DECT (DISPOSABLE) ×2 IMPLANT
SUCTION FRAZIER HANDLE 10FR (MISCELLANEOUS) ×1
SUCTION TUBE FRAZIER 10FR DISP (MISCELLANEOUS) ×1 IMPLANT
SUT ETHILON 2 0 PSLX (SUTURE) ×14 IMPLANT
TOWEL OR 17X24 6PK STRL BLUE (TOWEL DISPOSABLE) ×2 IMPLANT
TOWEL OR 17X26 10 PK STRL BLUE (TOWEL DISPOSABLE) ×2 IMPLANT
TUBE CONNECTING 12X1/4 (SUCTIONS) ×2 IMPLANT
WATER STERILE IRR 1000ML POUR (IV SOLUTION) IMPLANT

## 2016-05-31 NOTE — Progress Notes (Signed)
Patient ID: Scott Hebert, male   DOB: 1960-07-27, 56 y.o.   MRN: [id number] Nothing by mouth this morning for surgery this afternoon for ankle fusion. Order placed for PICC line again. Patient states he was confused and thought that he was going to be taking oral antibiotics. Plan for 6 weeks IV vancomycin per infectious disease.

## 2016-05-31 NOTE — Transfer of Care (Signed)
Immediate Anesthesia Transfer of Care Note  Patient: Scott Hebert  Procedure(s) Performed: Procedure(s): IRRIGATION AND DEBRIDEMENT RIGHT ANKLE WITH FUSION (Right)  Patient Location: PACU  Anesthesia Type:General  Level of Consciousness: awake, alert  and oriented  Airway & Oxygen Therapy: Patient Spontanous Breathing  Post-op Assessment: Report given to RN and Post -op Vital signs reviewed and stable  Post vital signs: Reviewed and stable  Last Vitals:  Vitals:   05/31/16 1500 05/31/16 2056  BP: 128/82   Pulse: 67 91  Resp: 14 17  Temp: 37.1 C 36.7 C    Last Pain:  Vitals:   05/31/16 2056  TempSrc:   PainSc: Asleep      Patients Stated Pain Goal: 3 (81/85/90 9311)  Complications: No apparent anesthesia complications

## 2016-05-31 NOTE — Anesthesia Procedure Notes (Signed)
Procedure Name: LMA Insertion Date/Time: 05/31/2016 7:42 PM Performed by: Manuela Schwartz B Pre-anesthesia Checklist: Patient identified, Emergency Drugs available, Suction available, Patient being monitored and Timeout performed Patient Re-evaluated:Patient Re-evaluated prior to inductionOxygen Delivery Method: Circle system utilized Preoxygenation: Pre-oxygenation with 100% oxygen Intubation Type: IV induction LMA: LMA inserted LMA Size: 5.0 Number of attempts: 1 Placement Confirmation: positive ETCO2 and breath sounds checked- equal and bilateral Tube secured with: Tape Dental Injury: Teeth and Oropharynx as per pre-operative assessment

## 2016-05-31 NOTE — Anesthesia Preprocedure Evaluation (Addendum)
Anesthesia Evaluation  Patient identified by MRN, date of birth, ID band Patient awake    Reviewed: Allergy & Precautions, H&P , NPO status , Patient's Chart, lab work & pertinent test results  Airway Mallampati: III  TM Distance: >3 FB Neck ROM: Full    Dental no notable dental hx. (+) Teeth Intact, Dental Advisory Given   Pulmonary Current Smoker,    Pulmonary exam normal breath sounds clear to auscultation       Cardiovascular negative cardio ROS   Rhythm:Regular Rate:Normal     Neuro/Psych negative neurological ROS  negative psych ROS   GI/Hepatic Neg liver ROS, GERD  Controlled,  Endo/Other  negative endocrine ROS  Renal/GU negative Renal ROS  negative genitourinary   Musculoskeletal  (+) Arthritis , Osteoarthritis,    Abdominal   Peds  Hematology negative hematology ROS (+)   Anesthesia Other Findings   Reproductive/Obstetrics negative OB ROS                            Anesthesia Physical Anesthesia Plan  ASA: II  Anesthesia Plan: General   Post-op Pain Management:    Induction: Intravenous  Airway Management Planned: LMA  Additional Equipment:   Intra-op Plan:   Post-operative Plan: Extubation in OR  Informed Consent: I have reviewed the patients History and Physical, chart, labs and discussed the procedure including the risks, benefits and alternatives for the proposed anesthesia with the patient or authorized representative who has indicated his/her understanding and acceptance.   Dental advisory given  Plan Discussed with: CRNA  Anesthesia Plan Comments:         Anesthesia Quick Evaluation

## 2016-05-31 NOTE — Progress Notes (Signed)
    [facility] for Infectious Disease    Date of Admission:  05/26/2016   Total days of antibiotics 6        Day 6 vancomycin   ID: Scott Hebert is a 56 y.o. male with MRSA septic arthritis of right ankle s/p excision of partial right distal fibula, and Ix D of deep abscess within ankle joint Active Problems:   Septic arthritis of right ankle (HCC)    Subjective: Afebrile. Starting to have pain, firm, warm areas on his forearms bilaterally where he previously had PIV. He had picc line placed without difficulty. He is currently NPO to undergo ankle fusion by dr duda today  Medications:  . aspirin EC  325 mg Oral Daily  . chlorhexidine  60 mL Topical Once  . docusate sodium  100 mg Oral BID  . feeding supplement (ENSURE ENLIVE)  237 mL Oral BID BM  . feeding supplement (PRO-STAT SUGAR FREE 64)  30 mL Oral BID  . nicotine  14 mg Transdermal Daily  . povidone-iodine  2 application Topical Once    Objective: Vital signs in last 24 hours: Temp:  [98.1 F (36.7 C)-98.8 F (37.1 C)] 98.7 F (37.1 C) (05/21 1500) Pulse Rate:  [67-84] 67 (05/21 1500) Resp:  [14] 14 (05/21 1500) BP: (127-137)/(69-82) 128/82 (05/21 1500) SpO2:  [98 %-100 %] 98 % (05/21 1500)  Physical Exam  Constitutional: He is oriented to person, place, and time. He appears well-developed and well-nourished. No distress.  HENT:  Mouth/Throat: Oropharynx is clear and moist. No oropharyngeal exudate.  Cardiovascular: Normal rate, regular rhythm and normal heart sounds. Exam reveals no gallop and no friction rub.  No murmur heard.  Pulmonary/Chest: Effort normal and breath sounds normal. No respiratory distress. He has no wheezes.  Abdominal: Soft. Bowel sounds are normal. He exhibits no distension. There is no tenderness.  Lymphadenopathy:  He has no cervical adenopathy.  Neurological: He is alert and oriented to person, place, and time.  Ext: right and left forearm have 2cm areas of warm, erythematous  tender areas c/w phlebitis Skin: Skin is warm and dry. No rash noted. No erythema. Right ankle wound vac still in place Psychiatric: He has a normal mood and affect. His behavior is normal.    Lab Results  Recent Labs  05/29/16 0535 05/31/16 0343  NA 134* 137  K 3.8 4.2  CL 102 104  CO2 23 26  BUN 14 20  CREATININE 0.83 0.96   Lab Results  Component Value Date   ESRSEDRATE 74 (H) 05/27/2016   Lab Results  Component Value Date   CRP 3.9 (H) 05/27/2016    Microbiology: 5/16 blood cx ngtd 5/16 MRSA S vanco, S bactrim, S rif, S doxy Studies/Results: No results found.   Assessment/Plan: mrsa septic arthritis/fibula osteo = recommend 8 wk of IV vancomycin. Currently on day 6 of treatment course  - will place OPAt order in place for home health to be arranged - we will get outpatient visits established in ID clinic as well as review his weekly labs for vancomycin monitoring  Therapeutic drug monitoring = will repeat his vancomycin trough tomorrow to see that he is in the therapeutic range. Cr appears stable  Health maintenance = will check hiv and hep C screening  [place], [facility] for Infectious Diseases Cell: [phone] Pager: [phone]  05/31/2016, 6:52 PM

## 2016-05-31 NOTE — Progress Notes (Signed)
Report given to Vincent, RN.

## 2016-05-31 NOTE — Care Management Note (Signed)
Case Management Note  Patient Details  Name: Scott Hebert MRN: [id number] Date of Birth: April 16, 1960  Subjective/Objective:     56 yr old gentleman admitted with c/o R foot pain secodnary to osteomeylitis of R ankle, s/p I&D open R ankle 05/26/16 . Patient will return to OR today for ankle fusion.     Action/Plan: Case manager spoke with patient concerning discharge plan and DME needs. Choice for Home Health Agency was offered. Referral was called to [facility] [place] will be providing IV antibiotics and HHRN and PT. Patient states he lives with his [name]' and will have assistance. CM has requested RW through [facility].    Expected Discharge Date:   06/02/16               Expected Discharge Plan:  [place]  In-House Referral:     Discharge planning Services  CM Consult  Post Acute Care Choice:  Home Health Choice offered to:  Patient  DME Arranged:   RW DME Agency:   [facility]  HH Arranged:  RN, IV Antibiotics HH Agency:   [facility]  Status of Service:  In process, will continue to follow  If discussed at Long Length of Stay Meetings, dates discussed:    Additional Comments:  Ninfa Meeker, RN 05/31/2016, 1:42 PM

## 2016-05-31 NOTE — Op Note (Signed)
05/26/2016 - 05/31/2016  8:57 PM  PATIENT:  Scott Hebert    PRE-OPERATIVE DIAGNOSIS:  Osteomyelitis Right Ankle  POST-OPERATIVE DIAGNOSIS:  Same  PROCEDURE:  IRRIGATION AND DEBRIDEMENT RIGHT ANKLE with EXCISION SKIN AND SOFT TISSUE AND BONE. FUSION RIGHT ANKLE. LOCAL TISSUE REARRANGEMENT FOR WOUND CLOSURE 9 X 3 CM. APPLICATION OF INCISIONAL WOUND VAC  SURGEON:  Newt Minion, MD  PHYSICIAN ASSISTANT:None ANESTHESIA:   General  PREOPERATIVE INDICATIONS:  [name] is a  56 y.o. male with a diagnosis of Osteomyelitis Right Ankle who failed conservative measures and elected for surgical management.    The risks benefits and alternatives were discussed with the patient preoperatively including but not limited to the risks of infection, bleeding, nerve injury, cardiopulmonary complications, the need for revision surgery, among others, and the patient was willing to proceed.  OPERATIVE IMPLANTS: Synthes 6.5 headless cannulated screws 2, Provera plus wound VAC  OPERATIVE FINDINGS: The limited cartilage from the articular surface of the ankle joint no abscess no purulence  OPERATIVE PROCEDURE: Patient was brought to the operating room and underwent general anesthetic. After adequate levels anesthesia obtained patient's right lower extremity was prepped using Betadine paint draped into a sterile field a timeout was called. Elliptical incision was made around his previous wound VAC incision. The distal fibula was resected. Perpendicular cuts were made to the tibial plafond and talar dome perpendicular to the long axis of the tibia. All nonviable tissue was removed with the Roger and osteotomes and curettes. The ankles reduced and reduction was verified with C-arm. 2 crossing K wires were placed to stabilize the tibial talar joint this was secured with 6.5 headless cannulated screws. AP and lateral radiograph confirmed a neutral ankle with stable internal fixation. The wound was irrigated  throughout the case with pulsatile lavage. Hemostasis was obtained. The local tissue rearrangement was performed from the lateral incision with closure with 2-0 nylon wound 9 x 3 cm. The wound edges approximated well. A incisional Proventil plus wound VAC was applied the sciatic good suction fit. Patient was extubated taken to the PACU in stable condition.

## 2016-05-31 NOTE — Progress Notes (Signed)
Physical Therapy Treatment Patient Details Name: Scott Hebert MRN: [id number] DOB: Oct 16, 1960 Today's Date: 05/31/2016    History of Present Illness Pt c/o R foot pain secodnary to osteomeylitis of R ankle s/p I&D open R ankle 05/26/16. PMH ORIF of open R ankle fx 01/2016. dx with MRSA infextion over fibular hardware. Pt nicotine user and PMH of OA and GERD.    PT Comments    Today's skilled session focused on HEP to strengthen the R LE and gait training. Pt able to observe NWB on the R without cueing and ambulates with a hop to pattern. Need to practice stairs in next session to prepare for return home. Patient would benefit from continued skilled PT. Will continue to follow acutely.    Follow Up Recommendations  Home health PT;Supervision/Assistance - 24 hour     Equipment Recommendations  3in1 (PT);Rolling walker with 5" wheels    Recommendations for Other Services       Precautions / Restrictions Precautions Precautions: Fall Precaution Comments: due to NWB status Restrictions Weight Bearing Restrictions: Yes RLE Weight Bearing: Non weight bearing    Mobility  Bed Mobility Overal bed mobility: Modified Independent             General bed mobility comments: used bedrails to come to EoB  Transfers Overall transfer level: Needs assistance Equipment used: Rolling walker (2 wheeled) Transfers: Sit to/from Stand Sit to Stand: Supervision         General transfer comment: no assist required to power up into standing. Supervision for safety  Ambulation/Gait Ambulation/Gait assistance: Min guard   Assistive device: Rolling walker (2 wheeled) Gait Pattern/deviations:  (hop to pattern) Gait velocity: slowed   General Gait Details: vc to stay inside the walker. Able to maintain WB precautions.   Stairs            Wheelchair Mobility    Modified Rankin (Stroke Patients Only)       Balance Overall balance assessment: Needs  assistance Sitting-balance support: No upper extremity supported;Feet unsupported Sitting balance-Leahy Scale: Normal     Standing balance support: Bilateral upper extremity supported Standing balance-Leahy Scale: Good Standing balance comment: steady in upright with single UE support to steady                            Cognition Arousal/Alertness: Awake/alert Behavior During Therapy: WFL for tasks assessed/performed Overall Cognitive Status: Within Functional Limits for tasks assessed                                        Exercises Total Joint Exercises Hip ABduction/ADduction: AROM;Right;10 reps;Standing Long Arc Quad: AROM;Right;10 reps;Seated Marching in Standing: AROM;Right;10 reps;Standing Standing Hip Extension: AROM;10 reps;Right;Standing General Exercises - Lower Extremity Ankle Circles/Pumps: Both;10 reps;Seated;AROM    General Comments        Pertinent Vitals/Pain Pain Assessment: 0-10 Pain Score: 0-No pain Pain Intervention(s): Monitored during session;Limited activity within patient's tolerance    Home Living                      Prior Function            PT Goals (current goals can now be found in the care plan section) Acute Rehab PT Goals Patient Stated Goal: go home and never come back PT Goal Formulation: With patient Time For Goal  Achievement: 06/10/16 Potential to Achieve Goals: Good Progress towards PT goals: Progressing toward goals    Frequency    Min 5X/week      PT Plan Current plan remains appropriate    Co-evaluation              AM-PAC PT "6 Clicks" Daily Activity  Outcome Measure  Difficulty turning over in bed (including adjusting bedclothes, sheets and blankets)?: A Little Difficulty moving from lying on back to sitting on the side of the bed? : A Little Difficulty sitting down on and standing up from a chair with arms (e.g., wheelchair, bedside commode, etc,.)?: A Lot Help  needed moving to and from a bed to chair (including a wheelchair)?: A Little Help needed walking in hospital room?: A Little Help needed climbing 3-5 steps with a railing? : Total 6 Click Score: 15    End of Session Equipment Utilized During Treatment: Gait belt Activity Tolerance: Patient tolerated treatment well Patient left: in chair;with call bell/phone within reach Nurse Communication: Mobility status;Precautions;Weight bearing status PT Visit Diagnosis: Unsteadiness on feet (R26.81);Other abnormalities of gait and mobility (R26.89);Muscle weakness (generalized) (M62.81);Pain Pain - Right/Left: Right Pain - part of body: Ankle and joints of foot     Time: 7371-0626 PT Time Calculation (min) (ACUTE ONLY): 30 min  Charges:  $Gait Training: 8-22 mins $Therapeutic Exercise: 8-22 mins                    G Codes:       Benjiman Core, PTA Acute Rehab [place] 05/31/2016, 11:37 AM

## 2016-05-31 NOTE — Progress Notes (Signed)
Peripherally Inserted Central Catheter/Midline Placement  The IV Nurse has discussed with the patient and/or persons authorized to consent for the patient, the purpose of this procedure and the potential benefits and risks involved with this procedure.  The benefits include less needle sticks, lab draws from the catheter, and the patient may be discharged home with the catheter. Risks include, but not limited to, infection, bleeding, blood clot (thrombus formation), and puncture of an artery; nerve damage and irregular heartbeat and possibility to perform a PICC exchange if needed/ordered by physician.  Alternatives to this procedure were also discussed.  Bard Power PICC patient education guide, fact sheet on infection prevention and patient information card has been provided to patient /or left at bedside.    PICC/Midline Placement Documentation        Scott Hebert 05/31/2016, 1:57 PM

## 2016-05-31 NOTE — Anesthesia Postprocedure Evaluation (Signed)
Anesthesia Post Note  Patient: Beola Cord  Procedure(s) Performed: Procedure(s) (LRB): IRRIGATION AND DEBRIDEMENT RIGHT ANKLE WITH FUSION (Right)  Patient location during evaluation: PACU Anesthesia Type: General Level of consciousness: awake and alert Pain management: pain level controlled Vital Signs Assessment: post-procedure vital signs reviewed and stable Respiratory status: spontaneous breathing, nonlabored ventilation, respiratory function stable and patient connected to nasal cannula oxygen Cardiovascular status: blood pressure returned to baseline and stable Postop Assessment: no signs of nausea or vomiting Anesthetic complications: no       Last Vitals:  Vitals:   05/31/16 2057 05/31/16 2151  BP: 129/78 (!) 156/82  Pulse: 90 93  Resp: 19 16  Temp:  36.6 C    Last Pain:  Vitals:   05/31/16 2056  TempSrc:   PainSc: Asleep                 Colston Pyle,W. EDMOND

## 2016-06-01 ENCOUNTER — Encounter (HOSPITAL_COMMUNITY): Payer: Self-pay | Admitting: Orthopedic Surgery

## 2016-06-01 DIAGNOSIS — Z981 Arthrodesis status: Secondary | ICD-10-CM

## 2016-06-01 LAB — BASIC METABOLIC PANEL
Anion gap: 10 (ref 5–15)
BUN: 11 mg/dL (ref 6–20)
CALCIUM: 9.1 mg/dL (ref 8.9–10.3)
CHLORIDE: 95 mmol/L — AB (ref 101–111)
CO2: 27 mmol/L (ref 22–32)
CREATININE: 0.82 mg/dL (ref 0.61–1.24)
Glucose, Bld: 118 mg/dL — ABNORMAL HIGH (ref 65–99)
Potassium: 4.3 mmol/L (ref 3.5–5.1)
Sodium: 132 mmol/L — ABNORMAL LOW (ref 135–145)

## 2016-06-01 LAB — CULTURE, BLOOD (ROUTINE X 2)
Culture: NO GROWTH
Culture: NO GROWTH
Special Requests: ADEQUATE
Special Requests: ADEQUATE

## 2016-06-01 LAB — HIV ANTIBODY (ROUTINE TESTING W REFLEX): HIV SCREEN 4TH GENERATION: NONREACTIVE

## 2016-06-01 LAB — VANCOMYCIN, TROUGH: Vancomycin Tr: 18 ug/mL (ref 15–20)

## 2016-06-01 NOTE — Progress Notes (Signed)
Pharmacy Antibiotic Note  Scott Hebert is a 56 y.o. male admitted on 05/26/2016 with MRSA osteomyelitis/septic arthritis.  Pharmacy has been consulted for vancomycin dosing. Vancomycin trough = 18 mcg/mL  Goal trough 15-20 mcg/mL  Plan: - continue vancomycin 1500 mg iv q12h.   - consider rechecking in few days to make sure no accumulation or in 7 days if continue past 10 days. - monitor renal function  Height: 5\' 9"  (175.3 cm) Weight: 235 lb (106.6 kg) IBW/kg (Calculated) : 70.7  Temp (24hrs), Avg:98.4 F (36.9 C), Min:97.8 F (36.6 C), Max:98.8 F (37.1 C)   Recent Labs Lab 05/26/16 0946 05/27/16 1011 05/29/16 0535 05/31/16 0343 06/01/16 0443 06/01/16 1535  WBC 6.9  --   --   --   --   --   CREATININE  --  0.89 0.83 0.96 0.82  --   VANCOTROUGH  --   --  14*  --   --  18    Estimated Creatinine Clearance: 121.1 mL/min (by C-G formula based on SCr of 0.82 mg/dL).    Allergies  Allergen Reactions  . Shellfish-Derived Products Anaphylaxis    Thank you for allowing pharmacy to be a part of this patient's care.  Lamika Connolly, Tsz-Yin 06/01/2016 4:43 PM

## 2016-06-01 NOTE — Progress Notes (Addendum)
[facility] for Infectious Disease    Date of Admission:  05/26/2016   Total days of antibiotics 7        Day 7 vancomycin   ID: Scott Hebert is a 56 y.o. male with MRSA septic arthritis of right ankle s/p excision of partial right distal fibula, and Ix D of deep abscess within ankle joint Active Problems:   Septic arthritis of right ankle (HCC)    Subjective: Afebrile. Underwent ankle fusion by dr duda last night. Has some right ankle pain but no fevers.  Medications:  . aspirin EC  325 mg Oral Daily  . docusate sodium  100 mg Oral BID  . feeding supplement (ENSURE ENLIVE)  237 mL Oral BID BM  . feeding supplement (PRO-STAT SUGAR FREE 64)  30 mL Oral BID  . nicotine  14 mg Transdermal Daily    Objective: Vital signs in last 24 hours: Temp:  [97.8 F (36.6 C)-99.1 F (37.3 C)] 99.1 F (37.3 C) (05/22 1746) Pulse Rate:  [90-97] 97 (05/22 1746) Resp:  [16-19] 17 (05/22 0100) BP: (129-156)/(78-87) 137/85 (05/22 1746) SpO2:  [94 %-99 %] 96 % (05/22 1746)  Physical Exam  Constitutional: He is oriented to person, place, and time. He appears well-developed and well-nourished. No distress.  HENT:  Mouth/Throat: Oropharynx is clear and moist. No oropharyngeal exudate.  Cardiovascular: Normal rate, regular rhythm and normal heart sounds. Exam reveals no gallop and no friction rub.  No murmur heard.  Pulmonary/Chest: Effort normal and breath sounds normal. No respiratory distress. He has no wheezes.  Abdominal: Soft. Bowel sounds are normal. He exhibits no distension. There is no tenderness.  Lymphadenopathy:  He has no cervical adenopathy.  Neurological: He is alert and oriented to person, place, and time.  Ext: right and left forearm have 2cm areas of warm, erythematous tender areas c/w phlebitis. Right ankle wound vac in place. Right arm picc line in place c/d/i Skin: Skin is warm and dry. No rash noted. No erythema. Right ankle wound vac still in place Psychiatric:  He has a normal mood and affect. His behavior is normal.    Lab Results  Recent Labs  05/31/16 0343 06/01/16 0443  NA 137 132*  K 4.2 4.3  CL 104 95*  CO2 26 27  BUN 20 11  CREATININE 0.96 0.82   Lab Results  Component Value Date   ESRSEDRATE 74 (H) 05/27/2016   Lab Results  Component Value Date   CRP 3.9 (H) 05/27/2016    Microbiology: 5/16 blood cx ngtd 5/16 MRSA S vanco, S bactrim, S rif, S doxy Studies/Results: No results found.   Assessment/Plan: mrsa septic arthritis/fibula osteo = recommend 8 wk of IV vancomycin. Currently on day 7 of treatment course  - he and wife has [facility] - we will get outpatient visits established in ID clinic as well as review his weekly labs for vancomycin monitoring  Therapeutic drug monitoring =  he is in the therapeutic range. Cr appears stable  Will sign off and have clinic arrange follow up appt.   .-------------------- Diagnosis: Septic arthritis/osteo  Culture Result: MRSA  Allergies  Allergen Reactions  . Shellfish-Derived Products Anaphylaxis    Discharge antibiotics: Per pharmacy protocol  Vanco Aim for Vancomycin trough 15-20 (unless otherwise indicated) Duration: 7 wk End Date: July 10  [facility] Care Per Protocol:  Labs weekly while on IV antibiotics: x__ CBC with differential _x_ BMP __ CMP _x_ CRP  _x_ ESR __x Vancomycin trough  __x_ Please pull PIC at completion of IV antibiotics __ Please leave PIC in place until doctor has seen patient or been notified  Fax weekly labs to [phone]  Clinic Follow Up Appt: 4 wk  @  [facility] Cell: [phone] Pager: [phone]  06/01/2016, 7:13 PM

## 2016-06-01 NOTE — Progress Notes (Signed)
Orthopedic Tech Progress Note Patient Details:  Scott Hebert [birth year] [id number]  Ortho Devices Type of Ortho Device: CAM walker Ortho Device/Splint Location: rle Ortho Device/Splint Interventions: Application   Aneta Mins 06/01/2016, 7:12 AM

## 2016-06-01 NOTE — Progress Notes (Signed)
Physical Therapy Treatment Patient Details Name: Scott Hebert MRN: [id number] DOB: 08/09/1960 Today's Date: 06/01/2016    History of Present Illness Pt c/o R foot pain secodnary to osteomeylitis of R ankle s/p I&D open R ankle 05/26/16. PMH ORIF of open R ankle fx 01/2016. dx with MRSA infextion over fibular hardware. Pt nicotine user and PMH of OA and GERD.. Pt s/p R ankle fusion with wound vac 5/21.    PT Comments    Pt underwent ankle fusion with wound vac placement yesterday 5/21. Pt remains R LE NWB. Pt mobility limited by pain today. Will need to complete stair negotiation prior to d/c home.   Follow Up Recommendations  No PT follow up;Supervision/Assistance - 24 hour     Equipment Recommendations  3in1 (PT);Rolling walker with 5" wheels    Recommendations for Other Services       Precautions / Restrictions Precautions Precautions: Fall Precaution Comments: R ankle wound vac Required Braces or Orthoses: Other Brace/Splint Other Brace/Splint: CAM boot on R LE when walking Restrictions Weight Bearing Restrictions: Yes RLE Weight Bearing: Non weight bearing    Mobility  Bed Mobility Overal bed mobility: Modified Independent             General bed mobility comments: used bedrails to come to EoB  Transfers Overall transfer level: Needs assistance Equipment used: Rolling walker (2 wheeled) Transfers: Sit to/from Stand Sit to Stand: Supervision         General transfer comment: v/c's for safe hand placement  Ambulation/Gait Ambulation/Gait assistance: Min guard Ambulation Distance (Feet): 60 Feet Assistive device: Rolling walker (2 wheeled) Gait Pattern/deviations: Step-to pattern Gait velocity: slowed Gait velocity interpretation: Below normal speed for age/gender General Gait Details: pt able to maintain R LE NWB, 1 standing rest break at 30'   Stairs Stairs:  (pt defered to tomorrow (wednesday) due to pain)          Wheelchair Mobility     Modified Rankin (Stroke Patients Only)       Balance Overall balance assessment: Needs assistance Sitting-balance support: No upper extremity supported;Feet unsupported Sitting balance-Leahy Scale: Normal     Standing balance support: Bilateral upper extremity supported Standing balance-Leahy Scale: Poor Standing balance comment: requires RW for safe balance due to R LE NWB, R LE wound vac, and R LE cam boot                            Cognition Arousal/Alertness: Awake/alert Behavior During Therapy: WFL for tasks assessed/performed Overall Cognitive Status: Within Functional Limits for tasks assessed                                        Exercises      General Comments General comments (skin integrity, edema, etc.): pt assisted to the bathroom, indep with tolieting, had to sit to urinate      Pertinent Vitals/Pain Pain Assessment: 0-10 Pain Score: 9  Pain Location: R foot Pain Descriptors / Indicators: Throbbing Pain Intervention(s): Limited activity within patient's tolerance;Patient requesting pain meds-RN notified    Home Living                      Prior Function            PT Goals (current goals can now be found in the care plan section) Acute  Rehab PT Goals Patient Stated Goal: go home tomorrow Progress towards PT goals: Progressing toward goals    Frequency    Min 5X/week      PT Plan Discharge plan needs to be updated    Co-evaluation              AM-PAC PT "6 Clicks" Daily Activity  Outcome Measure  Difficulty turning over in bed (including adjusting bedclothes, sheets and blankets)?: A Little Difficulty moving from lying on back to sitting on the side of the bed? : A Little Difficulty sitting down on and standing up from a chair with arms (e.g., wheelchair, bedside commode, etc,.)?: A Little Help needed moving to and from a bed to chair (including a wheelchair)?: A Little Help needed walking in  hospital room?: A Little Help needed climbing 3-5 steps with a railing? : A Lot 6 Click Score: 17    End of Session   Activity Tolerance: Patient limited by pain Patient left: in bed;with call bell/phone within reach Nurse Communication: Mobility status;Patient requests pain meds PT Visit Diagnosis: Unsteadiness on feet (R26.81);Other abnormalities of gait and mobility (R26.89);Muscle weakness (generalized) (M62.81);Pain Pain - Right/Left: Right Pain - part of body: Ankle and joints of foot     Time: 4098-1191 PT Time Calculation (min) (ACUTE ONLY): 39 min  Charges:  $Gait Training: 8-22 mins $Therapeutic Activity: 23-37 mins                    G Codes:       Kittie Plater, PT, DPT Pager #: [phone] Office #: [phone]    [place] 06/01/2016, 11:01 AM

## 2016-06-01 NOTE — Progress Notes (Signed)
Patient ID: Scott Hebert, male   DOB: 03/24/1960, 55 y.o.   MRN: [id number] Patient is postoperative day 1 ankle fusion with application of wound VAC. Examination there is about 150 mL and the wound VAC canister the fracture boot was again requested this morning. We will start gait training nonweightbearing on the right. Anticipate discharge to home tomorrow Wednesday.

## 2016-06-02 DIAGNOSIS — Z5181 Encounter for therapeutic drug level monitoring: Secondary | ICD-10-CM

## 2016-06-02 DIAGNOSIS — A4902 Methicillin resistant Staphylococcus aureus infection, unspecified site: Secondary | ICD-10-CM

## 2016-06-02 DIAGNOSIS — I809 Phlebitis and thrombophlebitis of unspecified site: Secondary | ICD-10-CM

## 2016-06-02 DIAGNOSIS — M86061 Acute hematogenous osteomyelitis, right tibia and fibula: Secondary | ICD-10-CM

## 2016-06-02 DIAGNOSIS — T801XXA Vascular complications following infusion, transfusion and therapeutic injection, initial encounter: Secondary | ICD-10-CM

## 2016-06-02 LAB — HEPATITIS C ANTIBODY

## 2016-06-02 MED ORDER — OXYCODONE-ACETAMINOPHEN 5-325 MG PO TABS: 1.0000 | ORAL_TABLET | ORAL | 0 refills | Status: DC | PRN

## 2016-06-02 MED ORDER — VANCOMYCIN HCL 10 G IV SOLR: 1500.0000 mg | Freq: Two times a day (BID) | INTRAVENOUS | 84 refills | Status: DC

## 2016-06-02 MED ORDER — ASPIRIN EC 325 MG PO TBEC: 325.0000 mg | DELAYED_RELEASE_TABLET | Freq: Every day | ORAL | 0 refills | Status: DC

## 2016-06-02 NOTE — Progress Notes (Signed)
All discharge instructions reviewed in detail with pt, pt's fiance, & pt's sister with understanding verbalized by all.  Wound vac in place to right ankle (changed to home machine by rep @ approx 12pm with teaching provided).  PICC line to right upper arm saline locked post completion of IV Vanc.  Dressing C/D/I with antimicrobial disc present.  Pt reports improvement in overall pain and denies any questions or concerns prior to discharge.  Discharged to home with family in stable condition.

## 2016-06-02 NOTE — Progress Notes (Signed)
Physical Therapy Treatment Patient Details Name: Scott Hebert MRN: [id number] DOB: 24-Jan-1960 Today's Date: 06/02/2016    History of Present Illness Pt c/o R foot pain secodnary to osteomeylitis of R ankle s/p I&D open R ankle 05/26/16. PMH ORIF of open R ankle fx 01/2016. dx with MRSA infextion over fibular hardware. Pt nicotine user and PMH of OA and GERD.. Pt s/p R ankle fusion with wound vac 5/21.    PT Comments    Pt progressing well and was able to navigate stairs with supervision. Pt with noted more swelling R LE. Per pt, MD said not to use CAM boot today due to excessive swelling. Acute PT to con't to follow.   Follow Up Recommendations  No PT follow up;Supervision/Assistance - 24 hour     Equipment Recommendations  3in1 (PT);Rolling walker with 5" wheels    Recommendations for Other Services       Precautions / Restrictions Precautions Precautions: Fall Precaution Comments: R ankle wound vac Required Braces or Orthoses: Other Brace/Splint Other Brace/Splint: CAM boot on R LE when walking Restrictions Weight Bearing Restrictions: Yes RLE Weight Bearing: Non weight bearing    Mobility  Bed Mobility Overal bed mobility: Modified Independent             General bed mobility comments: used bedrails to come to EoB  Transfers Overall transfer level: Needs assistance Equipment used: Rolling walker (2 wheeled) Transfers: Sit to/from Stand Sit to Stand: Supervision         General transfer comment: v/c's for safe hand placement and line managment  Ambulation/Gait Ambulation/Gait assistance: Min guard Ambulation Distance (Feet): 60 Feet Assistive device: Rolling walker (2 wheeled) Gait Pattern/deviations: Step-to pattern Gait velocity: slowed Gait velocity interpretation: Below normal speed for age/gender General Gait Details: pt able to maintain R LE NWB, 1 standing rest break at 30'   Stairs Stairs: Yes   Stair Management: Two rails;Step to  pattern;Forwards Number of Stairs: 2 (x2 trials) General stair comments: pt able to maintain R LE NWB  and clear L foot up step  Wheelchair Mobility    Modified Rankin (Stroke Patients Only)       Balance Overall balance assessment: Needs assistance Sitting-balance support: No upper extremity supported Sitting balance-Leahy Scale: Normal     Standing balance support: Bilateral upper extremity supported Standing balance-Leahy Scale: Poor Standing balance comment: requires RW                            Cognition Arousal/Alertness: Awake/alert Behavior During Therapy: WFL for tasks assessed/performed Overall Cognitive Status: Within Functional Limits for tasks assessed                                        Exercises      General Comments General comments (skin integrity, edema, etc.): pt assisted to bathroom, pt indep with tolieting with use of grab bar      Pertinent Vitals/Pain Pain Assessment: 0-10 Pain Score: 9  Pain Location: R foot Pain Descriptors / Indicators: Throbbing Pain Intervention(s): Monitored during session    Home Living                      Prior Function            PT Goals (current goals can now be found in the care plan section)  Acute Rehab PT Goals Patient Stated Goal: go home today Progress towards PT goals: Progressing toward goals    Frequency    Min 5X/week      PT Plan Discharge plan needs to be updated    Co-evaluation              AM-PAC PT "6 Clicks" Daily Activity  Outcome Measure  Difficulty turning over in bed (including adjusting bedclothes, sheets and blankets)?: None Difficulty moving from lying on back to sitting on the side of the bed? : None Difficulty sitting down on and standing up from a chair with arms (e.g., wheelchair, bedside commode, etc,.)?: A Little Help needed moving to and from a bed to chair (including a wheelchair)?: A Little Help needed walking in  hospital room?: A Little Help needed climbing 3-5 steps with a railing? : A Little 6 Click Score: 20    End of Session Equipment Utilized During Treatment: Gait belt Activity Tolerance: Patient limited by pain Patient left: in chair;with call bell/phone within reach;with family/visitor present Nurse Communication: Mobility status;Patient requests pain meds PT Visit Diagnosis: Unsteadiness on feet (R26.81);Other abnormalities of gait and mobility (R26.89);Muscle weakness (generalized) (M62.81);Pain Pain - Right/Left: Right Pain - part of body: Ankle and joints of foot     Time: 2952-8413 PT Time Calculation (min) (ACUTE ONLY): 41 min  Charges:  $Gait Training: 38-52 mins                    G Codes:       Kittie Plater, PT, DPT Pager #: [phone] Office #: [phone]   [place] 06/02/2016, 11:22 AM

## 2016-06-02 NOTE — Discharge Summary (Signed)
Discharge Diagnoses:  Active Problems:   Septic arthritis of right ankle (HCC)   Surgeries: Procedure(s): IRRIGATION AND DEBRIDEMENT RIGHT ANKLE WITH FUSION on 05/26/2016 - 05/31/2016    Consultants:   Discharged Condition: Improved  Hospital Course: Scott Hebert is an 56 y.o. male who was admitted 05/26/2016 with a chief complaint of septic right ankle status post open reduction internal fixation ankle fracture with history of tobacco use, with a final diagnosis of Osteomyelitis Right Ankle.  Patient was brought to the operating room on 05/26/2016 - 05/31/2016 and underwent Procedure(s): IRRIGATION AND DEBRIDEMENT RIGHT ANKLE WITH FUSION.    Patient was given perioperative antibiotics: Anti-infectives    Start     Dose/Rate Route Frequency Ordered Stop   06/02/16 0000  vancomycin 1,500 mg in sodium chloride 0.9 % 500 mL     1,500 mg 250 mL/hr over 120 Minutes Intravenous Every 12 hours 06/02/16 0635     05/31/16 1700  ceFAZolin (ANCEF) IVPB 2g/100 mL premix  Status:  Discontinued     2 g 200 mL/hr over 30 Minutes Intravenous To Short Stay 05/30/16 1946 05/31/16 1920   05/29/16 1600  vancomycin (VANCOCIN) 1,500 mg in sodium chloride 0.9 % 500 mL IVPB     1,500 mg 250 mL/hr over 120 Minutes Intravenous Every 12 hours 05/29/16 0700     05/26/16 1800  vancomycin (VANCOCIN) 1,250 mg in sodium chloride 0.9 % 250 mL IVPB  Status:  Discontinued     1,250 mg 166.7 mL/hr over 90 Minutes Intravenous Every 12 hours 05/26/16 1559 05/29/16 0658   05/26/16 0913  ceFAZolin (ANCEF) IVPB 2g/100 mL premix     2 g 200 mL/hr over 30 Minutes Intravenous On call to O.R. 05/26/16 0913 05/26/16 1158    .  Patient was given sequential compression devices, early ambulation, and aspirin for DVT prophylaxis.  Recent vital signs: Patient Vitals for the past 24 hrs:  BP Temp Temp src Pulse Resp SpO2  06/02/16 0310 136/80 98.9 F (37.2 C) Oral (!) 102 18 98 %  06/02/16 0014 137/80 99.5 F (37.5 C) Oral 96  18 97 %  06/01/16 1746 137/85 99.1 F (37.3 C) Oral 97 - 96 %  06/01/16 0928 (!) 146/87 98.7 F (37.1 C) Oral 97 - 99 %  .  Recent laboratory studies: No results found.  Discharge Medications:   Allergies as of 06/02/2016      Reactions   Shellfish-derived Products Anaphylaxis      Medication List    STOP taking these medications   silver sulfADIAZINE 1 % cream Commonly known as:  SILVADENE     TAKE these medications   aspirin EC 325 MG tablet Take 1 tablet (325 mg total) by mouth daily. What changed:  Another medication with the same name was added. Make sure you understand how and when to take each.   aspirin EC 325 MG tablet Take 1 tablet (325 mg total) by mouth daily. What changed:  You were already taking a medication with the same name, and this prescription was added. Make sure you understand how and when to take each.   naproxen sodium 220 MG tablet Commonly known as:  ANAPROX Take 400 mg by mouth 2 (two) times daily with a meal.   oxyCODONE-acetaminophen 5-325 MG tablet Commonly known as:  ROXICET Take 1 tablet by mouth every 8 (eight) hours as needed for severe pain. What changed:  when to take this   oxyCODONE-acetaminophen 5-325 MG tablet Commonly known as:  ROXICET Take 1 tablet by mouth every 4 (four) hours as needed for severe pain. What changed:  You were already taking a medication with the same name, and this prescription was added. Make sure you understand how and when to take each.   sulfamethoxazole-trimethoprim 800-160 MG tablet Commonly known as:  BACTRIM DS,SEPTRA DS Take 1 tablet by mouth 2 (two) times daily.   vancomycin 1,500 mg in sodium chloride 0.9 % 500 mL Inject 1,500 mg into the vein every 12 (twelve) hours.            Durable Medical Equipment        Start     Ordered   06/01/16 [phone]  For home use only DME Walker rolling  Once    Question:  Patient needs a walker to treat with the following condition  Answer:  Status post  ankle fusion   06/01/16 0820      Diagnostic Studies: Xr Ankle Complete Right  Result Date: 05/21/2016 Three-view radiographs of the right ankle shows old bony changes from the retained hardware there is still a fibrous union at the fracture site. With some shortening of the fibula. There is some widening of the medial joint line no evidence of a fracture of the medial malleolus. Patient's pain is primarily over the medial joint line which is widened by a few millimeters.   Patient benefited maximally from their hospital stay and there were no complications.     Disposition: 01-Home or Self Care Discharge Instructions    Call MD / Call 911    Complete by:  As directed    If you experience chest pain or shortness of breath, CALL 911 and be transported to the hospital emergency room.  If you develope a fever above 101 F, pus (white drainage) or increased drainage or redness at the wound, or calf pain, call your surgeon's office.   Constipation Prevention    Complete by:  As directed    Drink plenty of fluids.  Prune juice may be helpful.  You may use a stool softener, such as Colace (over the counter) 100 mg twice a day.  Use MiraLax (over the counter) for constipation as needed.   Diet - low sodium heart healthy    Complete by:  As directed    Elevate operative extremity    Complete by:  As directed    Increase activity slowly as tolerated    Complete by:  As directed    Negative Pressure Wound Therapy - Incisional    Complete by:  As directed    Continue wound VAC for 1 week. When the vac alarms and stops working remove the dressing and applied dry dressing.   Non weight bearing    Complete by:  As directed    Laterality:  right   Extremity:  Lower     Follow-up Information    Newt Minion, MD Follow up in 1 week(s).   Specialty:  Orthopedic Surgery Contact information: [place] [address] [phone]        [facility]  Follow up.   Why:  A representative from [facility] will contact you to arrange start date and time for your [facility] and physical therapist.  Contact information: [address] [place] [address] [phone]            Signed: Newt Minion 06/02/2016, 6:35 AM

## 2016-06-09 ENCOUNTER — Ambulatory Visit (INDEPENDENT_AMBULATORY_CARE_PROVIDER_SITE_OTHER): Admitting: Family

## 2016-06-09 ENCOUNTER — Ambulatory Visit (INDEPENDENT_AMBULATORY_CARE_PROVIDER_SITE_OTHER)

## 2016-06-09 DIAGNOSIS — Z981 Arthrodesis status: Secondary | ICD-10-CM | POA: Insufficient documentation

## 2016-06-09 DIAGNOSIS — M00071 Staphylococcal arthritis, right ankle and foot: Secondary | ICD-10-CM

## 2016-06-09 NOTE — Progress Notes (Signed)
Post-Op Visit Note   Patient: Scott Hebert           Date of Birth: Jun 08, 1960           MRN: [id number] Visit Date: 06/09/2016 PCP: Patient, [name]  Chief Complaint:  Chief Complaint  Patient presents with  . Right Ankle - Follow-up    IRRIGATION AND DEBRIDEMENT RIGHT ANKLE WITH FUSION    HPI:  The patient is a 56 year old gentleman who is 9 days status post right ankle fusion. He is status post septic arthritis. Has been doing dry dressing changes and wound care daily for the last week. The wound VAC was removed in the office last Friday.    Ortho Exam Incisions are well approximated with sutures. The medial portal holes are clean and dry. These sutures were harvested today without incident. There is no drainage from the lateral incision no odor no erythema no cellulitis. Does have moderate swelling to his foot and ankle.  Visit Diagnoses:  1. 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 S/P ankle fusion     Plan: Continue daily wound cleansing. Dry dressing changes daily. Nonweightbearing on the right lower extremity where the fracture boot daily.  Has follow-up with infectious diseases on June second.  Follow-up in office in 2 weeks. With radiographs of the right ankle.  Follow-Up Instructions: Return in about 2 weeks (around 06/23/2016).   Imaging: Xr Ankle Complete Right  Result Date: 06/09/2016 Stable tibiotalar fusion. No complicating features. Distal fibula surgically absent.   Orders:  Orders Placed This Encounter  Procedures  . XR Ankle Complete Right   No orders of the defined types were placed in this encounter.    PMFS History: Patient Active Problem List   Diagnosis Date Noted  . S/P ankle fusion 06/09/2016  . Acute hematogenous osteomyelitis of right fibula ([place])   . MRSA infection   . Therapeutic drug monitoring   . Phlebitis after infusion   . Septic arthritis of right ankle ([place]) 05/26/2016  . 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 Closed extra-articular fracture of distal end of right tibia   . Closed fracture of distal end of fibula, unspecified fracture morphology, initial encounter 01/17/2016  . Allergy 12/07/2011   Past Medical History:  Diagnosis Date  . Allergy   . Arthritis    "right ankle" (05/26/2016)  . GERD (gastroesophageal reflux disease)   . Infection    RLE; MRSA    Family History  Problem Relation Age of Onset  . Cancer Mother     Past Surgical History:  Procedure Laterality Date  . ANKLE ARTHROSCOPY Right 05/26/2016  . ANKLE ARTHROSCOPY Right 05/26/2016   Procedure: Irrigation and Debridement Right Fibula and Arthroscopic Debridement Right Ankle, Apply Wound VAC;  Surgeon: Newt Minion, MD;  Location: [facility];  Service: Orthopedics;  Laterality: Right;  . ANKLE FUSION Right 05/31/2016   Procedure: IRRIGATION AND DEBRIDEMENT RIGHT ANKLE WITH FUSION;  Surgeon: Newt Minion, MD;  Location: [facility];  Service: Orthopedics;  Laterality: Right;  . APPLICATION OF WOUND VAC  05/26/2016  . FRACTURE SURGERY    . HARDWARE REMOVAL Right 03/19/2016   Procedure: HARDWARE REMOVAL RIGHT ANKLE;  Surgeon: Newt Minion, MD;  Location: [facility];  Service: Orthopedics;  Laterality: Right;  . ORIF ANKLE FRACTURE Right 01/18/2016   Procedure: OPEN REDUCTION INTERNAL FIXATION (ORIF) ANKLE FRACTURE;  Surgeon: Newt Minion, MD;  Location: [facility];  Service: Orthopedics;  Laterality: Right;  . TIBIA DEBRIDEMENT Right 05/26/2016   Social History   Occupational History  . [employer]   Social History Main Topics  . Smoking status: Current Every Day Smoker    Packs/day: 0.12    Years: 14.00    Types: Cigarettes  . Smokeless tobacco: Never Used  . Alcohol use 0.0 - 2.0 oz/week     Comment: 05/26/2016 "stopped drinking in 01/2016"  . Drug use: No  . Sexual activity: Yes    Partners: Female    Birth control/ protection: None

## 2016-06-12 NOTE — Addendum Note (Signed)
Addendum  created 06/12/16 1014 by Duane Boston, MD   Sign clinical note

## 2016-06-15 ENCOUNTER — Other Ambulatory Visit (INDEPENDENT_AMBULATORY_CARE_PROVIDER_SITE_OTHER): Payer: Self-pay | Admitting: Orthopedic Surgery

## 2016-06-15 ENCOUNTER — Telehealth (INDEPENDENT_AMBULATORY_CARE_PROVIDER_SITE_OTHER): Payer: Self-pay | Admitting: Orthopedic Surgery

## 2016-06-15 MED ORDER — OXYCODONE-ACETAMINOPHEN 5-325 MG PO TABS: 1.0000 | ORAL_TABLET | Freq: Three times a day (TID) | ORAL | 0 refills | Status: DC | PRN

## 2016-06-15 NOTE — Telephone Encounter (Signed)
Patient called asking for a refill on oxycodone. CB # [phone]

## 2016-06-15 NOTE — Telephone Encounter (Signed)
Called pt to advise that rx is available at the front desk for pick up

## 2016-06-15 NOTE — Telephone Encounter (Signed)
Pt is s/p an irrigation and debridement right ankle fusion 05/31/16 and is requesting refill of Percocet. Last refill 06/02/16 # 60

## 2016-06-15 NOTE — Telephone Encounter (Signed)
Prescription written

## 2016-06-20 ENCOUNTER — Emergency Department (HOSPITAL_COMMUNITY)
Admission: EM | Admit: 2016-06-20 | Discharge: 2016-06-20 | Disposition: A | Discharge status: Home / Self Care | Attending: Emergency Medicine | Admitting: Emergency Medicine

## 2016-06-20 ENCOUNTER — Emergency Department (HOSPITAL_COMMUNITY)

## 2016-06-20 ENCOUNTER — Encounter (HOSPITAL_COMMUNITY): Payer: Self-pay

## 2016-06-20 DIAGNOSIS — R51 Headache: Secondary | ICD-10-CM | POA: Insufficient documentation

## 2016-06-20 DIAGNOSIS — Z79899 Other long term (current) drug therapy: Secondary | ICD-10-CM | POA: Insufficient documentation

## 2016-06-20 DIAGNOSIS — Z7982 Long term (current) use of aspirin: Secondary | ICD-10-CM | POA: Insufficient documentation

## 2016-06-20 DIAGNOSIS — R21 Rash and other nonspecific skin eruption: Secondary | ICD-10-CM | POA: Diagnosis not present

## 2016-06-20 DIAGNOSIS — F17210 Nicotine dependence, cigarettes, uncomplicated: Secondary | ICD-10-CM | POA: Insufficient documentation

## 2016-06-20 DIAGNOSIS — R519 Headache, unspecified: Secondary | ICD-10-CM

## 2016-06-20 DIAGNOSIS — Z8614 Personal history of Methicillin resistant Staphylococcus aureus infection: Secondary | ICD-10-CM | POA: Diagnosis not present

## 2016-06-20 DIAGNOSIS — I10 Essential (primary) hypertension: Secondary | ICD-10-CM | POA: Insufficient documentation

## 2016-06-20 MED ORDER — METOCLOPRAMIDE HCL 5 MG/ML IJ SOLN
10.0000 mg | Freq: Once | INTRAMUSCULAR | Status: AC
  Administered 2016-06-20: 10 mg via INTRAVENOUS
  Filled 2016-06-20: qty 2

## 2016-06-20 MED ORDER — VANCOMYCIN HCL 10 G IV SOLR: 2000.0000 mg | Freq: Once | INTRAVENOUS | Status: DC

## 2016-06-20 MED ORDER — DIPHENHYDRAMINE HCL 50 MG/ML IJ SOLN
25.0000 mg | Freq: Once | INTRAMUSCULAR | Status: AC
  Administered 2016-06-20: 25 mg via INTRAVENOUS
  Filled 2016-06-20: qty 1

## 2016-06-20 MED ORDER — SODIUM CHLORIDE 0.9 % IV BOLUS (SEPSIS)
1000.0000 mL | Freq: Once | INTRAVENOUS | Status: AC
  Administered 2016-06-20: 1000 mL via INTRAVENOUS

## 2016-06-20 MED ORDER — HEPARIN SOD (PORK) LOCK FLUSH 100 UNIT/ML IV SOLN
500.0000 [IU] | Freq: Once | INTRAVENOUS | Status: DC
  Filled 2016-06-20: qty 5

## 2016-06-20 NOTE — ED Provider Notes (Signed)
[facility] DEPT Provider Note   CSN: [id number] Arrival date & time: 06/20/16  1517     History   Chief Complaint Chief Complaint  Patient presents with  . HTN/headache    HPI RECE Scott Hebert is a 56 y.o. male.  MINER KORAL is a 56 y.o. Male with history of hypertension and MRSA infection on vancomycin who presents the emergency department complaining of a headache and possible allergic reaction. Patient has a PICC line and has been receiving IV infusions of vancomycin for MRSA infection to his right ankle. He's been on Vancomycin since May. He tells me last night he was directed to increase his dose by infectious disease of vancomycin to 2000 mg. He reports after receiving this dose he developed a gradual onset of a headache as well as feeling itchy and slight rash all over his body. He reports this has not happened before. He reports he is due to get vancomycin infused twice a day. He reports that she rash has since resolved. He reports taking his blood pressure this morning and noted to be 496 systolic. He has since taken his blood pressure medicine. He reports his blood pressure has improved. He complains of a 7 out of 10 posterior headache currently. He reports that involves his entire head except for the frontal aspect of his head. He has taken nothing for treatment of his symptoms today. He denies fevers, tongue swelling, lip swelling, shortness of breath, chest pain, numbness, tingling, weakness, double vision, eye pain, abdominal pain, nausea, vomiting or diarrhea.   The history is provided by the patient, medical records and the spouse. No language interpreter was used.    Past Medical History:  Diagnosis Date  . Allergy   . Arthritis    "right ankle" (05/26/2016)  . GERD (gastroesophageal reflux disease)   . Infection    RLE; MRSA    Patient Active Problem List   Diagnosis Date Noted  . S/P ankle fusion 06/09/2016  . Acute hematogenous osteomyelitis of right  fibula ([place])   . MRSA infection   . Therapeutic drug monitoring   . Phlebitis after infusion   . Septic arthritis of right ankle ([place]) 05/26/2016  . 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 Closed extra-articular fracture of distal end of right tibia   . Closed fracture of distal end of fibula, unspecified fracture morphology, initial encounter 01/17/2016  . Allergy 12/07/2011    Past Surgical History:  Procedure Laterality Date  . ANKLE ARTHROSCOPY Right 05/26/2016  . ANKLE ARTHROSCOPY Right 05/26/2016   Procedure: Irrigation and Debridement Right Fibula and Arthroscopic Debridement Right Ankle, Apply Wound VAC;  Surgeon: Newt Minion, MD;  Location: [facility];  Service: Orthopedics;  Laterality: Right;  . ANKLE FUSION Right 05/31/2016   Procedure: IRRIGATION AND DEBRIDEMENT RIGHT ANKLE WITH FUSION;  Surgeon: Newt Minion, MD;  Location: [facility];  Service: Orthopedics;  Laterality: Right;  . APPLICATION OF WOUND VAC  05/26/2016  . FRACTURE SURGERY    . HARDWARE REMOVAL Right 03/19/2016   Procedure: HARDWARE REMOVAL RIGHT ANKLE;  Surgeon: Newt Minion, MD;  Location: [facility];  Service: Orthopedics;  Laterality: Right;  . ORIF ANKLE FRACTURE Right 01/18/2016   Procedure: OPEN REDUCTION INTERNAL FIXATION (ORIF) ANKLE FRACTURE;  Surgeon: Newt Minion, MD;  Location: [facility];  Service: Orthopedics;  Laterality: Right;  . TIBIA DEBRIDEMENT Right 05/26/2016       Home Medications    Prior to Admission medications  Medication Sig Start Date End Date Taking? Authorizing Provider  aspirin EC 325 MG tablet Take 1 tablet (325 mg total) by mouth daily. 01/18/16   Newt Minion, MD  aspirin EC 325 MG tablet Take 1 tablet (325 mg total) by mouth daily. 06/02/16   Newt Minion, MD  naproxen sodium (ANAPROX) 220 MG tablet Take 400 mg by mouth 2 (two) times daily with a meal.    [provider]  oxyCODONE-acetaminophen (ROXICET) 5-325 MG tablet Take 1 tablet by mouth every 4 (four)  hours as needed for severe pain. 06/02/16   Newt Minion, MD  oxyCODONE-acetaminophen (ROXICET) 5-325 MG tablet Take 1 tablet by mouth every 8 (eight) hours as needed for severe pain. 06/15/16   Newt Minion, MD  sulfamethoxazole-trimethoprim (BACTRIM DS,SEPTRA DS) 800-160 MG tablet Take 1 tablet by mouth 2 (two) times daily. 04/01/16   Suzan Slick, NP  vancomycin 1,500 mg in sodium chloride 0.9 % 500 mL Inject 1,500 mg into the vein every 12 (twelve) hours. 06/02/16   Newt Minion, MD    Family History Family History  Problem Relation Age of Onset  . Cancer Mother     Social History Social History  Substance Use Topics  . Smoking status: Current Every Day Smoker    Packs/day: 0.12    Years: 14.00    Types: Cigarettes  . Smokeless tobacco: Never Used  . Alcohol use 0.0 - 2.0 oz/week     Comment: 05/26/2016 "stopped drinking in 01/2016"     Allergies   Shellfish-derived products   Review of Systems Review of Systems  Constitutional: Negative for chills and fever.  HENT: Negative for congestion, facial swelling, sore throat and trouble swallowing.   Eyes: Negative for pain and visual disturbance.  Respiratory: Negative for cough, shortness of breath and wheezing.   Cardiovascular: Negative for chest pain and palpitations.  Gastrointestinal: Negative for abdominal pain, diarrhea, nausea and vomiting.  Genitourinary: Negative for dysuria.  Musculoskeletal: Negative for back pain and neck pain.  Skin: Positive for rash (Resolved.).  Neurological: Positive for headaches. Negative for dizziness, syncope, speech difficulty, weakness, light-headedness and numbness.     Physical Exam Updated Vital Signs BP 115/73   Pulse 82   Temp 97.8 F (36.6 C) (Oral)   Resp (!) 23   SpO2 99%   Physical Exam  Constitutional: He is oriented to person, place, and time. He appears well-developed and well-nourished. No distress.  Nontoxic appearing.  HENT:  Head: Normocephalic and  atraumatic.  Right Ear: External ear normal.  Left Ear: External ear normal.  Mouth/Throat: Oropharynx is clear and moist.  No tongue or lip swelling noted.  Eyes: Conjunctivae and EOM are normal. Pupils are equal, round, and reactive to light. Right eye exhibits no discharge. Left eye exhibits no discharge.  Neck: Normal range of motion. Neck supple. No JVD present. No tracheal deviation present.  Cardiovascular: Normal rate, regular rhythm, normal heart sounds and intact distal pulses.  Exam reveals no gallop and no friction rub.   No murmur heard. Pulmonary/Chest: Effort normal and breath sounds normal. No stridor. No respiratory distress. He has no wheezes. He has no rales.  Lungs are clear to ascultation bilaterally. Symmetric chest expansion bilaterally. No increased work of breathing. No rales or rhonchi.    Abdominal: Soft. There is no tenderness. There is no guarding.  Musculoskeletal: Normal range of motion. He exhibits no edema, tenderness or deformity.  PICC line noted to right UE.  No evidence of infection.   Lymphadenopathy:    He has no cervical adenopathy.  Neurological: He is alert and oriented to person, place, and time. No cranial nerve deficit or sensory deficit. He exhibits normal muscle tone. Coordination normal.  Patient is alert and oriented 3. Cranial nerves are intact. Speech is clear and coherent. No pronator drift. Finger to nose intact bilaterally. Good grip strengths bilaterally. Sensation is intact in his bilateral upper and lower extremities.  Skin: Skin is warm and dry. Capillary refill takes less than 2 seconds. No rash noted. He is not diaphoretic. No erythema. No pallor.  No rashes noted.  Psychiatric: He has a normal mood and affect. His behavior is normal.  Nursing note and vitals reviewed.    ED Treatments / Results  Labs (all labs ordered are listed, but only abnormal results are displayed) Labs Reviewed - No data to display  EKG  EKG  Interpretation None       Radiology Ct Head Wo Contrast  Result Date: 06/20/2016 CLINICAL DATA:  Occipital headache and photophobia EXAM: CT HEAD WITHOUT CONTRAST TECHNIQUE: Contiguous axial images were obtained from the base of the skull through the vertex without intravenous contrast. COMPARISON:  None. FINDINGS: Brain: No mass lesion, intraparenchymal hemorrhage or extra-axial collection. No evidence of acute cortical infarct. Brain parenchyma and CSF-containing spaces are normal for age. Vascular: No hyperdense vessel or unexpected calcification. Skull: Normal visualized skull base, calvarium and extracranial soft tissues. Sinuses/Orbits: Mild ethmoid sinus mucosal thickening. The other paranasal sinuses are clear. No fluid levels. No mastoid effusion. Normal orbits. IMPRESSION: Normal appearance of the brain. Mild ethmoid sinus mucosal thickening. Electronically Signed   By: Ulyses Jarred M.D.   On: 06/20/2016 19:11    Procedures Procedures (including critical care time)  Medications Ordered in ED Medications  heparin lock flush 100 unit/mL (not administered)  metoCLOPramide (REGLAN) injection 10 mg (10 mg Intravenous Given 06/20/16 1805)  diphenhydrAMINE (BENADRYL) injection 25 mg (25 mg Intravenous Given 06/20/16 1809)  sodium chloride 0.9 % bolus 1,000 mL (1,000 mLs Intravenous New Bag/Given 06/20/16 1809)     Initial Impression / Assessment and Plan / ED Course  I have reviewed the triage vital signs and the nursing notes.  Pertinent labs & imaging results that were available during my care of the patient were reviewed by me and considered in my medical decision making (see chart for details).    This is a 56 y.o. Male with history of hypertension and MRSA infection on vancomycin who presents the emergency department complaining of a headache and possible allergic reaction. Patient has a PICC line and has been receiving IV infusions of vancomycin for MRSA infection to his right  ankle. He's been on Vancomycin since May. He tells me last night he was directed to increase his dose by infectious disease of vancomycin to 2000 mg. He reports after receiving this dose he developed a gradual onset of a headache as well as feeling itchy and slight rash all over his body. He reports this has not happened before. He reports he is due to get vancomycin infused twice a day. He reports that she rash has since resolved. He reports taking his blood pressure this morning and noted to be 130 systolic. He has since taken his blood pressure medicine. He reports his blood pressure has improved. He complains of a 7 out of 10 posterior headache currently. On exam the patient is afebrile and nontoxic appearing. He has no focal neurological deficits. PICC  line is in place to his right upper extremity without signs of infection. I see no rashes or hives present. Patient received migraine cocktail with complete resolution of his headache. No concern for subarachnoid hemorrhage, meningitis or temporal arteritis. His blood pressure also improved and a repeat is 115/73 prior to discharge. I did encourage him to follow-up with primary care to have his blood pressure rechecked. Patient was concerned he had a reaction to the Vancomyin at an increased dose. Plan here was to provide him with the vancomycin and observe him to see if he develops any reaction. Question if he was receiving the medication too quickly. Patient declines this. He tells me he would like to take his vancomycin at home and he will return if he has any problems. I did discuss the risks of this. He reports he understands the risk and would still like to be discharged. He is feeling much better and feels ready to go home. I discussed strict and specific return precautions. I advised the patient to follow-up with their primary care provider, ID and ortho this week. I advised the patient to return to the emergency department with new or worsening  symptoms or new concerns. The patient verbalized understanding and agreement with plan.     This patient was discussed with Dr. Ralene Bathe who agrees with assessment and plan.   Final Clinical Impressions(s) / ED Diagnoses   Final diagnoses:  Bad headache  Rash and nonspecific skin eruption  Essential hypertension  History of MRSA infection    New Prescriptions New Prescriptions   No medications on file     Sharmaine Base 06/20/16 2040    Quintella Reichert, MD 06/24/16 [phone]

## 2016-06-20 NOTE — ED Notes (Signed)
PT requested to speak with RN. Stated he wanted to go home and self administer his antibiotic. Provider notified

## 2016-06-20 NOTE — ED Triage Notes (Signed)
Patient here with occipital headache x 2 days with photophobia. States that he took his BP at home 196/123. On arrival alert and oriented, BP normal on arrival. No other associated sympotms

## 2016-06-20 NOTE — Discharge Instructions (Signed)
If you develop problems or concerns with taking your mediations please return to the ER or call infectious disease doctor. Please follow up with primary care and have your blood pressure rechecked.

## 2016-06-22 ENCOUNTER — Telehealth: Payer: Self-pay | Admitting: *Deleted

## 2016-06-22 NOTE — Telephone Encounter (Signed)
-----   Message from Carlyle Basques, MD sent at 06/22/2016  8:40 AM EDT ----- Can you call advance to d/c vancomycin. Pull picc line. Can you put him on doxycycline 100mg  bidx 8 wk

## 2016-06-22 NOTE — Telephone Encounter (Signed)
Verbal order per Dr Baxter Flattery given to [facility] at [place] to stop vancomycin and pull PICC.   Patient notified, stated he could not tolerate doxycycline when Dr Sharol Given gave him this previously - it made him throw up. He did not premedicate for nausea.  Please advise. Landis Gandy, RN

## 2016-06-23 ENCOUNTER — Ambulatory Visit (INDEPENDENT_AMBULATORY_CARE_PROVIDER_SITE_OTHER): Payer: Self-pay | Admitting: Orthopedic Surgery

## 2016-06-23 ENCOUNTER — Encounter (INDEPENDENT_AMBULATORY_CARE_PROVIDER_SITE_OTHER): Payer: Self-pay | Admitting: Orthopedic Surgery

## 2016-06-23 ENCOUNTER — Ambulatory Visit (INDEPENDENT_AMBULATORY_CARE_PROVIDER_SITE_OTHER)

## 2016-06-23 VITALS — Ht 69.0 in | Wt 235.0 lb

## 2016-06-23 DIAGNOSIS — M25571 Pain in right ankle and joints of right foot: Secondary | ICD-10-CM | POA: Diagnosis not present

## 2016-06-23 DIAGNOSIS — M00071 Staphylococcal arthritis, right ankle and foot: Secondary | ICD-10-CM

## 2016-06-23 NOTE — Progress Notes (Signed)
Office Visit Note   Patient: Scott Hebert           Date of Birth: [birth year]           MRN: [id number] Visit Date: 06/23/2016              Requested by: No referring provider defined for this encounter. PCP: Patient, [name]  Chief Complaint  Patient presents with  . Right Ankle - Routine Post Op    05/31/16 irrigation and debridement ankle and fusion      HPI: Patient is status post tibiotalar fusion for septic arthritis right ankle. Patient states that he developed symptoms including migraines from the vancomycin. He states that infectious disease stopped his vancomycin and he has been in contact with them to change his oral antibiotic. They recommend doxycycline but patient had side effects from this as well patient on his own has gone back on his Bactrim and is awaiting infectious disease recommendation for oral antibiotics.  Assessment & Plan: Visit Diagnoses:  1. Pain in right ankle and joints of right foot   2. Staphylococcal arthritis of right ankle (HCC)     Plan: Continue nonweightbearing continued the fracture boot wear his medical compression stockings around-the-clock to decrease the swelling. Patient will hold on physical therapy for 2 more weeks. Repeat radiographs 3 view of the right ankle at follow-up and anticipate PT progressive ambulation after follow-up with his fracture boot in place.  Follow-Up Instructions: Return in about 2 weeks (around 07/07/2016).   Ortho Exam  Patient is alert, oriented, no adenopathy, well-dressed, normal affect, normal respiratory effort. Examination the incision is well-healed there is no redness no cellulitis no signs of infection his foot is plantar grade his patellar lines up with the second ray of his foot. There is no varus or valgus malalignment of the hindfoot. There is no drainage no odor no tenderness to palpation.  Imaging: Xr Ankle Complete Right  Result Date: 06/23/2016 Three-view radiographs of the right ankle  shows a plantigrade foot with stable alignment of the tibial talar fusion.   Labs: Lab Results  Component Value Date   ESRSEDRATE 74 (H) 05/27/2016   CRP 3.9 (H) 05/27/2016   REPTSTATUS 06/01/2016 FINAL 05/26/2016   GRAMSTAIN  05/26/2016    ABUNDANT WBC PRESENT,BOTH PMN AND MONONUCLEAR NO ORGANISMS SEEN    CULT NO GROWTH 5 DAYS 05/26/2016   LABORGA METHICILLIN RESISTANT STAPHYLOCOCCUS AUREUS 05/26/2016    Orders:  Orders Placed This Encounter  Procedures  . XR Ankle Complete Right   No orders of the defined types were placed in this encounter.    Procedures: No procedures performed  Clinical Data: No additional findings.  ROS:  All other systems negative, except as noted in the HPI. Review of Systems  Objective: Vital Signs: Ht 5\' 9"  (1.753 m)   Wt 235 lb (106.6 kg)   BMI 34.70 kg/m   Specialty Comments:  No specialty comments available.  PMFS History: Patient Active Problem List   Diagnosis Date Noted  . S/P ankle fusion 06/09/2016  . 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 MRSA infection   . Therapeutic drug monitoring   . Phlebitis after infusion   . Septic arthritis of right ankle ([place]) 05/26/2016  . 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 Closed extra-articular fracture of distal end of right tibia   . Closed fracture of distal end of fibula, unspecified fracture morphology, initial encounter 01/17/2016  .  Allergy 12/07/2011   Past Medical History:  Diagnosis Date  . Allergy   . Arthritis    "right ankle" (05/26/2016)  . GERD (gastroesophageal reflux disease)   . Infection    RLE; MRSA    Family History  Problem Relation Age of Onset  . Cancer Mother     Past Surgical History:  Procedure Laterality Date  . ANKLE ARTHROSCOPY Right 05/26/2016  . ANKLE ARTHROSCOPY Right 05/26/2016   Procedure: Irrigation and Debridement Right Fibula and Arthroscopic Debridement Right Ankle, Apply Wound VAC;  Surgeon: Newt Minion, MD;  Location: [place];  Service: Orthopedics;  Laterality: Right;  . ANKLE FUSION Right 05/31/2016   Procedure: IRRIGATION AND DEBRIDEMENT RIGHT ANKLE WITH FUSION;  Surgeon: Newt Minion, MD;  Location: [place];  Service: Orthopedics;  Laterality: Right;  . APPLICATION OF WOUND VAC  05/26/2016  . FRACTURE SURGERY    . HARDWARE REMOVAL Right 03/19/2016   Procedure: HARDWARE REMOVAL RIGHT ANKLE;  Surgeon: Newt Minion, MD;  Location: [place];  Service: Orthopedics;  Laterality: Right;  . ORIF ANKLE FRACTURE Right 01/18/2016   Procedure: OPEN REDUCTION INTERNAL FIXATION (ORIF) ANKLE FRACTURE;  Surgeon: Newt Minion, MD;  Location: [place];  Service: Orthopedics;  Laterality: Right;  . TIBIA DEBRIDEMENT Right 05/26/2016   Social History   Occupational History  . [employer]   Social History Main Topics  . Smoking status: Current Every Day Smoker    Packs/day: 0.12    Years: 14.00    Types: Cigarettes  . Smokeless tobacco: Never Used  . Alcohol use 0.0 - 2.0 oz/week     Comment: 05/26/2016 "stopped drinking in 01/2016"  . Drug use: No  . Sexual activity: Yes    Partners: Female    Birth control/ protection: None

## 2016-06-24 ENCOUNTER — Other Ambulatory Visit: Payer: Self-pay | Admitting: *Deleted

## 2016-06-24 DIAGNOSIS — M00071 Staphylococcal arthritis, right ankle and foot: Secondary | ICD-10-CM

## 2016-06-24 DIAGNOSIS — R112 Nausea with vomiting, unspecified: Secondary | ICD-10-CM

## 2016-06-24 LAB — FUNGUS CULTURE WITH STAIN

## 2016-06-24 LAB — FUNGAL ORGANISM REFLEX

## 2016-06-24 LAB — FUNGUS CULTURE RESULT

## 2016-06-24 MED ORDER — DOXYCYCLINE HYCLATE 100 MG PO TABS: 100.0000 mg | ORAL_TABLET | Freq: Two times a day (BID) | ORAL | 0 refills | Status: DC

## 2016-06-24 MED ORDER — ONDANSETRON 8 MG PO TBDP
8.0000 mg | ORAL_TABLET | Freq: Two times a day (BID) | ORAL | 0 refills | Status: DC
Start: 2016-06-24 — End: 2016-09-20

## 2016-06-24 NOTE — Addendum Note (Signed)
Addended by: Landis Gandy on: 06/24/2016 02:57 PM   Modules accepted: Orders

## 2016-06-24 NOTE — Telephone Encounter (Signed)
Per Dr Baxter Flattery, 8mg  sublingual ondansetron #60 with 0 refills sent to pharmacy. Patient advised to take 1 hour before doxycycline, to take doxycycline on full stomach to reduce nausea with antibiotics. Patient has #60 doxycycline 100mg  at home when previously prescribed. Landis Gandy, RN

## 2016-06-29 ENCOUNTER — Telehealth (INDEPENDENT_AMBULATORY_CARE_PROVIDER_SITE_OTHER): Payer: Self-pay | Admitting: Orthopedic Surgery

## 2016-06-29 ENCOUNTER — Other Ambulatory Visit (INDEPENDENT_AMBULATORY_CARE_PROVIDER_SITE_OTHER): Payer: Self-pay | Admitting: Orthopedic Surgery

## 2016-06-29 MED ORDER — OXYCODONE-ACETAMINOPHEN 5-325 MG PO TABS: 1.0000 | ORAL_TABLET | ORAL | 0 refills | Status: DC | PRN

## 2016-06-29 NOTE — Telephone Encounter (Signed)
Prescription written

## 2016-06-29 NOTE — Telephone Encounter (Signed)
Called pt to advise that rx is ready for pick up at the front desk.

## 2016-06-29 NOTE — Telephone Encounter (Signed)
Pt is requesting a refill of his percocet 5/325 mg . Last refills  06/15/16 #30, 06/02/16 #60, /11/18 #30

## 2016-06-29 NOTE — Telephone Encounter (Signed)
Patient called needing Rx refilled (Oxycodone) The number to contact patient is [phone] °

## 2016-07-07 ENCOUNTER — Ambulatory Visit (INDEPENDENT_AMBULATORY_CARE_PROVIDER_SITE_OTHER)

## 2016-07-07 ENCOUNTER — Ambulatory Visit (INDEPENDENT_AMBULATORY_CARE_PROVIDER_SITE_OTHER): Admitting: Orthopedic Surgery

## 2016-07-07 ENCOUNTER — Encounter (INDEPENDENT_AMBULATORY_CARE_PROVIDER_SITE_OTHER): Payer: Self-pay | Admitting: Orthopedic Surgery

## 2016-07-07 DIAGNOSIS — M25571 Pain in right ankle and joints of right foot: Secondary | ICD-10-CM

## 2016-07-07 DIAGNOSIS — Z981 Arthrodesis status: Secondary | ICD-10-CM

## 2016-07-07 MED ORDER — OXYCODONE-ACETAMINOPHEN 5-325 MG PO TABS: 1.0000 | ORAL_TABLET | Freq: Three times a day (TID) | ORAL | 0 refills | Status: DC | PRN

## 2016-07-07 NOTE — Progress Notes (Signed)
Office Visit Note   Patient: Scott Hebert           Date of Birth: [birth year]           MRN: [id number] Visit Date: 07/07/2016              Requested by: No referring provider defined for this encounter. PCP: Patient, [name]  Chief Complaint  Patient presents with  . Right Ankle - Routine Post Op    05/31/16 irrigation and debridement ankle and fusion        HPI:  Patient is 5 weeks status post irrigation and debridement right ankle for septic joint and fusion. Patient has just had his PICC line removed and has completed his IV antibiotics. Patient states he does have pain at night in the right ankle.  Assessment & Plan: Visit Diagnoses:  1. Pain in right ankle and joints of right foot   2. S/P ankle fusion     Plan:  We'll place him in a short leg cast most likely his pain is from micromotion at the fusion site. There are no radiographic or clinical signs of infection. Follow-up in 3 weeks with repeat radiographs and placement of a new cast.  Follow-Up Instructions: Return in about 3 weeks (around 07/28/2016).   Ortho Exam  Patient is alert, oriented, no adenopathy, well-dressed, normal affect, normal respiratory effort.  Examination of the right ankle is a little bit of swelling but there is no redness no drainage no fluctuance no signs of infection or tenderness to palpation.  Imaging: Xr Ankle Complete Right  Result Date: 07/07/2016  Three-view radiographs of the right ankle shows interval callus formation at the fusion site. There are no complicating features no lytic changes or signs of any destructive infection.   Labs: Lab Results  Component Value Date   ESRSEDRATE 74 (H) 05/27/2016   CRP 3.9 (H) 05/27/2016   REPTSTATUS 06/01/2016 FINAL 05/26/2016   GRAMSTAIN  05/26/2016    ABUNDANT WBC PRESENT,BOTH PMN AND MONONUCLEAR NO ORGANISMS SEEN    CULT NO GROWTH 5 DAYS 05/26/2016   LABORGA METHICILLIN RESISTANT STAPHYLOCOCCUS AUREUS 05/26/2016     Orders:  Orders Placed This Encounter  Procedures  . XR Ankle Complete Right   Meds ordered this encounter  Medications  . oxyCODONE-acetaminophen (ROXICET) 5-325 MG tablet    Sig: Take 1 tablet by mouth every 8 (eight) hours as needed for severe pain.    Dispense:  30 tablet    Refill:  0     Procedures: No procedures performed  Clinical Data: No additional findings.  ROS:  All other systems negative, except as noted in the HPI. Review of Systems  Objective: Vital Signs: There were no vitals taken for this visit.  Specialty Comments:  No specialty comments available.  PMFS History: Patient Active Problem List   Diagnosis Date Noted  . S/P ankle fusion 06/09/2016  . 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 MRSA infection   . Therapeutic drug monitoring   . Phlebitis after infusion   . Septic arthritis of right ankle ([place]) 05/26/2016  . 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 Closed extra-articular fracture of distal end of right tibia   . Closed fracture of distal end of fibula, unspecified fracture morphology, initial encounter 01/17/2016  . Allergy 12/07/2011   Past Medical History:  Diagnosis Date  . Allergy   . Arthritis    "right ankle" (05/26/2016)  . GERD (  gastroesophageal reflux disease)   . Infection    RLE; MRSA    Family History  Problem Relation Age of Onset  . Cancer Mother     Past Surgical History:  Procedure Laterality Date  . ANKLE ARTHROSCOPY Right 05/26/2016  . ANKLE ARTHROSCOPY Right 05/26/2016   Procedure: Irrigation and Debridement Right Fibula and Arthroscopic Debridement Right Ankle, Apply Wound VAC;  Surgeon: Newt Minion, MD;  Location: [place];  Service: Orthopedics;  Laterality: Right;  . ANKLE FUSION Right 05/31/2016   Procedure: IRRIGATION AND DEBRIDEMENT RIGHT ANKLE WITH FUSION;  Surgeon: Newt Minion, MD;  Location: [place];  Service: Orthopedics;  Laterality: Right;  . APPLICATION OF  WOUND VAC  05/26/2016  . FRACTURE SURGERY    . HARDWARE REMOVAL Right 03/19/2016   Procedure: HARDWARE REMOVAL RIGHT ANKLE;  Surgeon: Newt Minion, MD;  Location: [place];  Service: Orthopedics;  Laterality: Right;  . ORIF ANKLE FRACTURE Right 01/18/2016   Procedure: OPEN REDUCTION INTERNAL FIXATION (ORIF) ANKLE FRACTURE;  Surgeon: Newt Minion, MD;  Location: [place];  Service: Orthopedics;  Laterality: Right;  . TIBIA DEBRIDEMENT Right 05/26/2016   Social History   Occupational History  . [employer]   Social History Main Topics  . Smoking status: Current Every Day Smoker    Packs/day: 0.12    Years: 14.00    Types: Cigarettes  . Smokeless tobacco: Never Used  . Alcohol use 0.0 - 2.0 oz/week     Comment: 05/26/2016 "stopped drinking in 01/2016"  . Drug use: No  . Sexual activity: Yes    Partners: Female    Birth control/ protection: None

## 2016-07-12 ENCOUNTER — Encounter: Payer: Self-pay | Admitting: Internal Medicine

## 2016-07-12 ENCOUNTER — Ambulatory Visit (INDEPENDENT_AMBULATORY_CARE_PROVIDER_SITE_OTHER): Admitting: Internal Medicine

## 2016-07-12 VITALS — BP 118/84 | HR 101

## 2016-07-12 DIAGNOSIS — N3944 Nocturnal enuresis: Secondary | ICD-10-CM | POA: Diagnosis not present

## 2016-07-12 DIAGNOSIS — M25571 Pain in right ankle and joints of right foot: Secondary | ICD-10-CM

## 2016-07-12 DIAGNOSIS — G8929 Other chronic pain: Secondary | ICD-10-CM | POA: Diagnosis not present

## 2016-07-12 DIAGNOSIS — M00071 Staphylococcal arthritis, right ankle and foot: Secondary | ICD-10-CM

## 2016-07-12 LAB — BASIC METABOLIC PANEL
BUN: 14 mg/dL (ref 7–25)
CHLORIDE: 99 mmol/L (ref 98–110)
CO2: 26 mmol/L (ref 20–31)
CREATININE: 1.02 mg/dL (ref 0.70–1.33)
Calcium: 9.8 mg/dL (ref 8.6–10.3)
Glucose, Bld: 114 mg/dL — ABNORMAL HIGH (ref 65–99)
POTASSIUM: 4.6 mmol/L (ref 3.5–5.3)
Sodium: 134 mmol/L — ABNORMAL LOW (ref 135–146)

## 2016-07-12 LAB — CBC WITH DIFFERENTIAL/PLATELET
BASOS ABS: 0 {cells}/uL (ref 0–200)
BASOS PCT: 0 %
EOS ABS: 192 {cells}/uL (ref 15–500)
Eosinophils Relative: 4 %
HEMATOCRIT: 37.6 % — AB (ref 38.5–50.0)
Hemoglobin: 11.7 g/dL — ABNORMAL LOW (ref 13.2–17.1)
LYMPHS PCT: 34 %
Lymphs Abs: 1632 cells/uL (ref 850–3900)
MCH: 24.3 pg — AB (ref 27.0–33.0)
MCHC: 31.1 g/dL — ABNORMAL LOW (ref 32.0–36.0)
MCV: 78.2 fL — ABNORMAL LOW (ref 80.0–100.0)
MONO ABS: 480 {cells}/uL (ref 200–950)
MONOS PCT: 10 %
NEUTROS ABS: 2496 {cells}/uL (ref 1500–7800)
Neutrophils Relative %: 52 %
PLATELETS: 239 10*3/uL (ref 140–400)
RBC: 4.81 MIL/uL (ref 4.20–5.80)
RDW: 17.8 % — ABNORMAL HIGH (ref 11.0–15.0)
WBC: 4.8 10*3/uL (ref 3.8–10.8)

## 2016-07-12 MED ORDER — DOXYCYCLINE HYCLATE 100 MG PO TABS: 100.0000 mg | ORAL_TABLET | Freq: Two times a day (BID) | ORAL | 2 refills | Status: DC

## 2016-07-12 MED ORDER — DICLOFENAC SODIUM 1 % TD GEL: 1.0000 g | Freq: Four times a day (QID) | TRANSDERMAL | 0 refills | Status: DC

## 2016-07-12 NOTE — Progress Notes (Signed)
RFV: MRSA of right ankle septic arthritis  Patient ID: Scott Hebert, male   DOB: 05-19-1960, 56 y.o.   MRN: [id number]  HPI Patient is 5 weeks status post irrigation and debridement right ankle for septic joint and fusion. Patient has just had his PICC line removed and has completed his IV antibiotics early since he had HA side effects and unable to afford alternative. He was switched to doxycycline. Initially had nausea associated with doxycycline but then pre medicated to zofran. Patient states he does have pain at night in the right ankle.   ROS: + nocturnal urination, frequent Outpatient Encounter Prescriptions as of 07/12/2016  Medication Sig  . aspirin EC 325 MG tablet Take 1 tablet (325 mg total) by mouth daily.  Marland Kitchen aspirin EC 325 MG tablet Take 1 tablet (325 mg total) by mouth daily.  Marland Kitchen doxycycline (VIBRA-TABS) 100 MG tablet Take 1 tablet (100 mg total) by mouth 2 (two) times daily.  . naproxen sodium (ANAPROX) 220 MG tablet Take 400 mg by mouth 2 (two) times daily with a meal.  . ondansetron (ZOFRAN-ODT) 8 MG disintegrating tablet Take 1 tablet (8 mg total) by mouth 2 (two) times daily. Take 60 minutes prior to doxycycline.  Marland Kitchen oxyCODONE-acetaminophen (ROXICET) 5-325 MG tablet Take 1 tablet by mouth every 4 (four) hours as needed for severe pain.  Marland Kitchen oxyCODONE-acetaminophen (ROXICET) 5-325 MG tablet Take 1 tablet by mouth every 8 (eight) hours as needed for severe pain.  . [DISCONTINUED] sulfamethoxazole-trimethoprim (BACTRIM DS,SEPTRA DS) 800-160 MG tablet Take 1 tablet by mouth 2 (two) times daily. (Patient not taking: Reported on 07/12/2016)   No facility-administered encounter medications on file as of 07/12/2016.      Patient Active Problem List   Diagnosis Date Noted  . S/P ankle fusion 06/09/2016  . 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 MRSA infection   . Therapeutic drug monitoring   . Phlebitis after infusion   . Septic arthritis of right ankle ([place])  05/26/2016  . 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 Closed extra-articular fracture of distal end of right tibia   . Closed fracture of distal end of fibula, unspecified fracture morphology, initial encounter 01/17/2016  . Allergy 12/07/2011     Health Maintenance Due  Topic Date Due  . TETANUS/TDAP  05/19/1979  . COLONOSCOPY  05/19/2010    Social History  Substance Use Topics  . Smoking status: Current Every Day Smoker    Packs/day: 0.12    Years: 14.00    Types: Cigarettes  . Smokeless tobacco: Never Used  . Alcohol use 0.0 - 2.0 oz/week     Comment: 05/26/2016 "stopped drinking in 01/2016"    Review of Systems 12 point ros is negative except for right ankle pain and swelling Physical Exam  BP 118/84   Pulse (!) 101  Physical Exam  Constitutional: He is oriented to person, place, and time. He appears well-developed and well-nourished. No distress.  HENT:  Mouth/Throat: Oropharynx is clear and moist. No oropharyngeal exudate.  Neurological: He is alert and oriented to person, place, and time.  Skin: Skin is warm and dry. No rash noted. No erythema.  Ext: trace edema to right ankle Psychiatric: He has a normal mood and affect. His behavior is normal.    CBC Lab Results  Component Value Date   WBC 6.9 05/26/2016   RBC 4.45 05/26/2016   HGB 11.3 (L) 05/26/2016   HCT 36.2 (L) 05/26/2016  PLT 330 05/26/2016   MCV 81.3 05/26/2016   MCH 25.4 (L) 05/26/2016   MCHC 31.2 05/26/2016   RDW 16.0 (H) 05/26/2016   LYMPHSABS 2.9 01/06/2011   MONOABS 0.7 01/06/2011   EOSABS 0.1 01/06/2011    BMET Lab Results  Component Value Date   NA 132 (L) 06/01/2016   K 4.3 06/01/2016   CL 95 (L) 06/01/2016   CO2 27 06/01/2016   GLUCOSE 118 (H) 06/01/2016   BUN 11 06/01/2016   CREATININE 0.82 06/01/2016   CALCIUM 9.1 06/01/2016   GFRNONAA >60 06/01/2016   GFRAA >60 06/01/2016   Lab Results  Component Value Date   ESRSEDRATE 74 (H) 05/27/2016   Lab Results    Component Value Date   CRP 3.9 (H) 05/27/2016    Lab Results  Component Value Date   ESRSEDRATE 38 (H) 07/12/2016   Lab Results  Component Value Date   CRP 12.5 (H) 07/12/2016     Assessment and Plan  MRSA septic arthritis =Will check inflammatory markers. May need to change to linezolid if not trending down   Addendum:  Continue with doxycycline, will give refills  Ankle pain = will recommend diclofenac cream to see if any benefit  Nocturnal urination = recommend to see pcp

## 2016-07-13 LAB — SEDIMENTATION RATE: Sed Rate: 38 mm/hr — ABNORMAL HIGH (ref 0–20)

## 2016-07-13 LAB — C-REACTIVE PROTEIN: CRP: 12.5 mg/L — ABNORMAL HIGH (ref <8.0)

## 2016-07-23 ENCOUNTER — Other Ambulatory Visit (INDEPENDENT_AMBULATORY_CARE_PROVIDER_SITE_OTHER): Payer: Self-pay | Admitting: Family

## 2016-07-23 ENCOUNTER — Telehealth (INDEPENDENT_AMBULATORY_CARE_PROVIDER_SITE_OTHER): Payer: Self-pay | Admitting: Orthopedic Surgery

## 2016-07-23 MED ORDER — OXYCODONE-ACETAMINOPHEN 5-325 MG PO TABS: 1.0000 | ORAL_TABLET | Freq: Two times a day (BID) | ORAL | 0 refills | Status: DC | PRN

## 2016-07-23 NOTE — Telephone Encounter (Signed)
Patient called needing Rx refilled (Oxycodone) The number to contact patient is [phone] °

## 2016-07-23 NOTE — Telephone Encounter (Signed)
LVM to let pt know RX is at front ready to pick up. Placed RX in draw up front after receiving from [employer].

## 2016-07-23 NOTE — Telephone Encounter (Signed)
Do you wish to fill Percocet rx? Pt will need to be notified if rx is written.

## 2016-08-02 ENCOUNTER — Ambulatory Visit (INDEPENDENT_AMBULATORY_CARE_PROVIDER_SITE_OTHER): Payer: Self-pay | Admitting: Orthopedic Surgery

## 2016-08-02 ENCOUNTER — Ambulatory Visit (INDEPENDENT_AMBULATORY_CARE_PROVIDER_SITE_OTHER)

## 2016-08-02 ENCOUNTER — Encounter (INDEPENDENT_AMBULATORY_CARE_PROVIDER_SITE_OTHER): Payer: Self-pay | Admitting: Orthopedic Surgery

## 2016-08-02 VITALS — Ht 69.0 in | Wt 235.0 lb

## 2016-08-02 DIAGNOSIS — M25571 Pain in right ankle and joints of right foot: Secondary | ICD-10-CM

## 2016-08-02 DIAGNOSIS — Z981 Arthrodesis status: Secondary | ICD-10-CM

## 2016-08-02 DIAGNOSIS — M00071 Staphylococcal arthritis, right ankle and foot: Secondary | ICD-10-CM

## 2016-08-02 MED ORDER — TRAMADOL HCL 50 MG PO TABS: 100.0000 mg | ORAL_TABLET | Freq: Two times a day (BID) | ORAL | 0 refills | Status: DC | PRN

## 2016-08-02 NOTE — Progress Notes (Signed)
Office Visit Note   Patient: Scott Hebert           Date of Birth: [birth year]           MRN: [id number] Visit Date: 08/02/2016              Requested by: No referring provider defined for this encounter. PCP: Patient, [name]  Chief Complaint  Patient presents with  . Right Ankle - Routine Post Op    05/31/16 right ankle irrigation, debridement and fusion       HPI: Patient is a 71 shell gentleman status post irrigation debridement and fusion for septic right ankle. Patient previously had a cast applied to decrease the micromotion and to promote better healing environment. Patient states that he had some anxiety with the cast states he blacked out and went to the urgent care shortly after the cast was applied to have it removed. Patient also was not wearing his fracture boot he states he occasionally wears the compression stockings complains of pain from swelling. He states that he cannot tolerate a cast.  Patient is 2 months status post irrigation debridement and fusion of the right ankle. Patient's wife will need to assess him at home for [facility] and we will need to complete her paperwork for 2 months.  Assessment & Plan: Visit Diagnoses:  1. Pain in right ankle and joints of right foot   2. S/P ankle fusion   3. Staphylococcal arthritis of right ankle (HCC)     Plan: Discussed the patient needs to wear the fracture boot 24 hours a day 7 days a week and wear the compression stocking and keep the foot elevated and keep the weight off the foot. Patient inquired about a shorter fracture boot and discussed that to maximally decreased motion a cast as the best fracture boot is acceptable but we cannot do a short type of boot. Again discussed the importance of elevation and compression stockings wearing the boot 24 hours a day 7 days a week. Discussed that with persistent micromotion the fusion site would not heal and that we would have to consider revision surgery. Patient states that  he has been falling lately. Discussed the importance of using the walker for ambulation.  Repeat 3 view radiographs of the right ankle at follow-up out of the fracture boot.  Follow-Up Instructions: Return in about 4 weeks (around 08/30/2016).   Ortho Exam  Patient is alert, oriented, no adenopathy, well-dressed, normal affect, normal respiratory effort. Examination there is swelling in the right lower extremity his leg is dependent. There is no redness no cellulitis no open wound or drainage no signs of infection. There is no tenderness to palpation. No evidence of DVT.  Imaging: Xr Ankle Complete Right  Result Date: 08/02/2016 Three-view radiographs of the right ankle show stable alignment with some lucency around the hardware. No lytic bony changes no evidence of destructive osteomyelitis.   Labs: Lab Results  Component Value Date   ESRSEDRATE 38 (H) 07/12/2016   ESRSEDRATE 74 (H) 05/27/2016   CRP 12.5 (H) 07/12/2016   CRP 3.9 (H) 05/27/2016   REPTSTATUS 06/01/2016 FINAL 05/26/2016   GRAMSTAIN  05/26/2016    ABUNDANT WBC PRESENT,BOTH PMN AND MONONUCLEAR NO ORGANISMS SEEN    CULT NO GROWTH 5 DAYS 05/26/2016   LABORGA METHICILLIN RESISTANT STAPHYLOCOCCUS AUREUS 05/26/2016    Orders:  Orders Placed This Encounter  Procedures  . XR Ankle Complete Right   No orders of the defined types  were placed in this encounter.    Procedures: No procedures performed  Clinical Data: No additional findings.  ROS:  All other systems negative, except as noted in the HPI. Review of Systems  Objective: Vital Signs: Ht 5\' 9"  (1.753 m)   Wt 235 lb (106.6 kg)   BMI 34.70 kg/m   Specialty Comments:  No specialty comments available.  PMFS History: Patient Active Problem List   Diagnosis Date Noted  . S/P ankle fusion 06/09/2016  . 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 MRSA infection   . Therapeutic drug monitoring   . Phlebitis after infusion   . Septic  arthritis of right ankle ([place]) 05/26/2016  . 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 Closed extra-articular fracture of distal end of right tibia   . Closed fracture of distal end of fibula, unspecified fracture morphology, initial encounter 01/17/2016  . Allergy 12/07/2011   Past Medical History:  Diagnosis Date  . Allergy   . Arthritis    "right ankle" (05/26/2016)  . GERD (gastroesophageal reflux disease)   . Infection    RLE; MRSA    Family History  Problem Relation Age of Onset  . Cancer Mother     Past Surgical History:  Procedure Laterality Date  . ANKLE ARTHROSCOPY Right 05/26/2016  . ANKLE ARTHROSCOPY Right 05/26/2016   Procedure: Irrigation and Debridement Right Fibula and Arthroscopic Debridement Right Ankle, Apply Wound VAC;  Surgeon: Newt Minion, MD;  Location: [place];  Service: Orthopedics;  Laterality: Right;  . ANKLE FUSION Right 05/31/2016   Procedure: IRRIGATION AND DEBRIDEMENT RIGHT ANKLE WITH FUSION;  Surgeon: Newt Minion, MD;  Location: [place];  Service: Orthopedics;  Laterality: Right;  . APPLICATION OF WOUND VAC  05/26/2016  . FRACTURE SURGERY    . HARDWARE REMOVAL Right 03/19/2016   Procedure: HARDWARE REMOVAL RIGHT ANKLE;  Surgeon: Newt Minion, MD;  Location: [place];  Service: Orthopedics;  Laterality: Right;  . ORIF ANKLE FRACTURE Right 01/18/2016   Procedure: OPEN REDUCTION INTERNAL FIXATION (ORIF) ANKLE FRACTURE;  Surgeon: Newt Minion, MD;  Location: [place];  Service: Orthopedics;  Laterality: Right;  . TIBIA DEBRIDEMENT Right 05/26/2016   Social History   Occupational History  . Material [employer]   Social History Main Topics  . Smoking status: Current Every Day Smoker    Packs/day: 0.12    Years: 14.00    Types: Cigarettes  . Smokeless tobacco: Never Used  . Alcohol use 0.0 - 2.0 oz/week     Comment: 05/26/2016 "stopped drinking in 01/2016"  . Drug use: No  . Sexual activity: Yes    Partners: Female    Birth  control/ protection: None

## 2016-08-12 ENCOUNTER — Ambulatory Visit (INDEPENDENT_AMBULATORY_CARE_PROVIDER_SITE_OTHER): Admitting: Internal Medicine

## 2016-08-12 ENCOUNTER — Encounter: Payer: Self-pay | Admitting: Internal Medicine

## 2016-08-12 VITALS — Temp 98.2°F | Ht 69.0 in | Wt 240.0 lb

## 2016-08-12 DIAGNOSIS — A499 Bacterial infection, unspecified: Secondary | ICD-10-CM | POA: Diagnosis not present

## 2016-08-12 DIAGNOSIS — G8929 Other chronic pain: Secondary | ICD-10-CM | POA: Diagnosis not present

## 2016-08-12 DIAGNOSIS — M00071 Staphylococcal arthritis, right ankle and foot: Secondary | ICD-10-CM | POA: Diagnosis not present

## 2016-08-12 DIAGNOSIS — M01X72 Direct infection of left ankle and foot in infectious and parasitic diseases classified elsewhere: Secondary | ICD-10-CM | POA: Diagnosis not present

## 2016-08-12 DIAGNOSIS — M25571 Pain in right ankle and joints of right foot: Secondary | ICD-10-CM | POA: Diagnosis not present

## 2016-08-12 DIAGNOSIS — M00872 Arthritis due to other bacteria, left ankle and foot: Secondary | ICD-10-CM

## 2016-08-12 MED ORDER — TRAZODONE HCL 50 MG PO TABS: 50.0000 mg | ORAL_TABLET | Freq: Every evening | ORAL | 3 refills | Status: DC | PRN

## 2016-08-12 NOTE — Progress Notes (Signed)
RFV: follow up for mrsa septic arthritis of right ankle Patient ID: Scott Hebert, male   DOB: 1960-03-16, 56 y.o.   MRN: [id number]  HPI Dovid is a 56yo M with MRSA right ankle septic arthritis with fusion and treated with IV abtx. Last seen on 7/3 switched to doxy at that time. Has been taking without difficulty still has sginificant ankle pain and swelling. Instructed to elevated,keep in boot  I have reviewed his notes in epic  Ros = insomnia, ankle throbbing, having stress from not working  Outpatient Encounter Prescriptions as of 08/12/2016  Medication Sig  . aspirin EC 325 MG tablet Take 1 tablet (325 mg total) by mouth daily.  Marland Kitchen aspirin EC 325 MG tablet Take 1 tablet (325 mg total) by mouth daily.  . diclofenac sodium (VOLTAREN) 1 % GEL Apply 2 g topically 4 (four) times daily.  Marland Kitchen doxycycline (VIBRA-TABS) 100 MG tablet Take 1 tablet (100 mg total) by mouth 2 (two) times daily.  . naproxen sodium (ANAPROX) 220 MG tablet Take 400 mg by mouth 2 (two) times daily with a meal.  . traMADol (ULTRAM) 50 MG tablet Take 2 tablets (100 mg total) by mouth every 12 (twelve) hours as needed. Maximum dose= 8 tablets per day  . ondansetron (ZOFRAN-ODT) 8 MG disintegrating tablet Take 1 tablet (8 mg total) by mouth 2 (two) times daily. Take 60 minutes prior to doxycycline. (Patient not taking: Reported on 08/12/2016)  . oxyCODONE-acetaminophen (ROXICET) 5-325 MG tablet Take 1 tablet by mouth 2 (two) times daily as needed for severe pain. (Patient not taking: Reported on 08/12/2016)   No facility-administered encounter medications on file as of 08/12/2016.      Patient Active Problem List   Diagnosis Date Noted  . S/P ankle fusion 06/09/2016  . Acute hematogenous osteomyelitis of right fibula ([place])   . MRSA infection   . Therapeutic drug monitoring   . Phlebitis after infusion   . Septic arthritis of right ankle ([place]) 05/26/2016  . 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 Closed  extra-articular fracture of distal end of right tibia   . Closed fracture of distal end of fibula, unspecified fracture morphology, initial encounter 01/17/2016  . Allergy 12/07/2011     Health Maintenance Due  Topic Date Due  . TETANUS/TDAP  05/19/1979  . COLONOSCOPY  05/19/2010  . INFLUENZA VACCINE  08/11/2016     Review of Systems Per hpi otherwise 12 point ros is negative Physical Exam   Temp 98.2 F (36.8 C) (Oral)   Ht 5\' 9"  (1.753 m)   Wt 240 lb (108.9 kg)   BMI 35.44 kg/m  Physical Exam  Constitutional: He is oriented to person, place, and time. He appears well-developed and well-nourished. No distress.  HENT:  Mouth/Throat: Oropharynx is clear and moist. No oropharyngeal exudate.  Cardiovascular: Normal rate, regular rhythm and normal heart sounds. Exam reveals no gallop and no friction rub.  No murmur heard.  Pulmonary/Chest: Effort normal and breath sounds normal. No respiratory distress. He has no wheezes.  Ext: right ankle in boot, wearing compression stockings, swelling noted Skin: Skin is warm and dry. No rash noted. No erythema.  Psychiatric: He has a normal mood and affect. His behavior is normal.    CBC Lab Results  Component Value Date   WBC 4.8 07/12/2016   RBC 4.81 07/12/2016   HGB 11.7 (L) 07/12/2016   HCT 37.6 (L) 07/12/2016   PLT 239 07/12/2016   MCV 78.2 (  L) 07/12/2016   MCH 24.3 (L) 07/12/2016   MCHC 31.1 (L) 07/12/2016   RDW 17.8 (H) 07/12/2016   LYMPHSABS 1,632 07/12/2016   MONOABS 480 07/12/2016   EOSABS 192 07/12/2016    BMET Lab Results  Component Value Date   NA 134 (L) 07/12/2016   K 4.6 07/12/2016   CL 99 07/12/2016   CO2 26 07/12/2016   GLUCOSE 114 (H) 07/12/2016   BUN 14 07/12/2016   CREATININE 1.02 07/12/2016   CALCIUM 9.8 07/12/2016   GFRNONAA >60 06/01/2016   GFRAA >60 06/01/2016   Lab Results  Component Value Date   ESRSEDRATE 38 (H) 07/12/2016   Lab Results  Component Value Date   CRP 12.5 (H) 07/12/2016     Lab Results  Component Value Date   ESRSEDRATE 16 08/12/2016   Lab Results  Component Value Date   CRP 6.7 08/12/2016      Assessment and Plan  MRSA septic arthritis = Will plan to continue on doxy if sed rate and crp are elevated. Will check labs today  Insomnia = plan to do  A trial of trazodone  Ankle pain  = recommend taking pain med at bedtime with trazodone to help with discomfort during sleep. Elevate, ice when at home  Addendum = inflammatory markers continue to improve. Plan on an additional month of doxy and monitor off of abtx thereafter

## 2016-08-13 LAB — SEDIMENTATION RATE: Sed Rate: 16 mm/hr (ref 0–20)

## 2016-08-13 LAB — C-REACTIVE PROTEIN: CRP: 6.7 mg/L (ref <8.0)

## 2016-08-30 ENCOUNTER — Ambulatory Visit (INDEPENDENT_AMBULATORY_CARE_PROVIDER_SITE_OTHER): Admitting: Orthopedic Surgery

## 2016-08-30 ENCOUNTER — Ambulatory Visit (INDEPENDENT_AMBULATORY_CARE_PROVIDER_SITE_OTHER)

## 2016-08-30 DIAGNOSIS — M00071 Staphylococcal arthritis, right ankle and foot: Secondary | ICD-10-CM

## 2016-08-30 DIAGNOSIS — Z981 Arthrodesis status: Secondary | ICD-10-CM

## 2016-08-30 DIAGNOSIS — M25571 Pain in right ankle and joints of right foot: Secondary | ICD-10-CM | POA: Diagnosis not present

## 2016-08-30 NOTE — Progress Notes (Signed)
Office Visit Note   Patient: Scott Hebert           Date of Birth: 01/05/1961           MRN: [id number] Visit Date: 08/30/2016              Requested by: No referring provider defined for this encounter. PCP: Patient, [name]  No chief complaint on file.     HPI: Patient is a 56 year old gentleman who presents in follow-up status post MRSA infection of the left ankle for which she underwent debridement and IV antibiotics he most recently has completed a course of oral antibiotics per infectious disease and ultimately underwent fusion of the ankle.  Assessment & Plan: Visit Diagnoses:  1. S/P ankle fusion   2. Staphylococcal arthritis of right ankle (HCC)     Plan: Continue with the fracture boot continue with the cane for ambulation. Patient states that he is moving to the [facility] for his medical treatment.  Repeat 3 view radiographs of the left ankle at follow-up in 4 weeks.  Follow-Up Instructions: Return in about 4 weeks (around 09/27/2016).   Ortho Exam  Patient is alert, oriented, no adenopathy, well-dressed, normal affect, normal respiratory effort. Examination patient endplates well with a 4 pronged cane. His dorsalis pedis pulse the incision is well-healed there is no redness no saline is no drainage no tenderness to palpation. He has good forefoot and midfoot motion. The ankle fusion is stable.  Imaging: Xr Ankle Complete Right  Result Date: 08/30/2016 2 view radiographs of the right ankle shows stable alignment of the ankle fusion there is slight lucency around the screws but no hardware failure no lytic destructive changes  No images are attached to the encounter.  Labs: Lab Results  Component Value Date   ESRSEDRATE 16 08/12/2016   ESRSEDRATE 38 (H) 07/12/2016   ESRSEDRATE 74 (H) 05/27/2016   CRP 6.7 08/12/2016   CRP 12.5 (H) 07/12/2016   CRP 3.9 (H) 05/27/2016   REPTSTATUS 06/01/2016 FINAL 05/26/2016   GRAMSTAIN  05/26/2016    ABUNDANT WBC  PRESENT,BOTH PMN AND MONONUCLEAR NO ORGANISMS SEEN    CULT NO GROWTH 5 DAYS 05/26/2016   LABORGA METHICILLIN RESISTANT STAPHYLOCOCCUS AUREUS 05/26/2016    Orders:  Orders Placed This Encounter  Procedures  . XR Ankle Complete Right   No orders of the defined types were placed in this encounter.    Procedures: No procedures performed  Clinical Data: No additional findings.  ROS:  All other systems negative, except as noted in the HPI. Review of Systems  Objective: Vital Signs: There were no vitals taken for this visit.  Specialty Comments:  No specialty comments available.  PMFS History: Patient Active Problem List   Diagnosis Date Noted  . S/P ankle fusion 06/09/2016  . 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 MRSA infection   . Therapeutic drug monitoring   . Phlebitis after infusion   . Septic arthritis of right ankle ([place]) 05/26/2016  . 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 Closed extra-articular fracture of distal end of right tibia   . Closed fracture of distal end of fibula, unspecified fracture morphology, initial encounter 01/17/2016  . Allergy 12/07/2011   Past Medical History:  Diagnosis Date  . Allergy   . Arthritis    "right ankle" (05/26/2016)  . GERD (gastroesophageal reflux disease)   . Infection    RLE; MRSA    Family History  Problem Relation Age of Onset  . Cancer Mother     Past Surgical History:  Procedure Laterality Date  . ANKLE ARTHROSCOPY Right 05/26/2016  . ANKLE ARTHROSCOPY Right 05/26/2016   Procedure: Irrigation and Debridement Right Fibula and Arthroscopic Debridement Right Ankle, Apply Wound VAC;  Surgeon: Newt Minion, MD;  Location: [place];  Service: Orthopedics;  Laterality: Right;  . ANKLE FUSION Right 05/31/2016   Procedure: IRRIGATION AND DEBRIDEMENT RIGHT ANKLE WITH FUSION;  Surgeon: Newt Minion, MD;  Location: [place];  Service: Orthopedics;  Laterality: Right;  . APPLICATION OF  WOUND VAC  05/26/2016  . FRACTURE SURGERY    . HARDWARE REMOVAL Right 03/19/2016   Procedure: HARDWARE REMOVAL RIGHT ANKLE;  Surgeon: Newt Minion, MD;  Location: [place];  Service: Orthopedics;  Laterality: Right;  . ORIF ANKLE FRACTURE Right 01/18/2016   Procedure: OPEN REDUCTION INTERNAL FIXATION (ORIF) ANKLE FRACTURE;  Surgeon: Newt Minion, MD;  Location: [place];  Service: Orthopedics;  Laterality: Right;  . TIBIA DEBRIDEMENT Right 05/26/2016   Social History   Occupational History  . [employer]   Social History Main Topics  . Smoking status: Current Every Day Smoker    Packs/day: 0.12    Years: 14.00    Types: Cigarettes  . Smokeless tobacco: Never Used  . Alcohol use 0.0 - 2.0 oz/week     Comment: 05/26/2016 "stopped drinking in 01/2016"  . Drug use: No  . Sexual activity: Yes    Partners: Female    Birth control/ protection: None

## 2016-09-20 ENCOUNTER — Encounter: Payer: Self-pay | Admitting: Internal Medicine

## 2016-09-20 ENCOUNTER — Ambulatory Visit (INDEPENDENT_AMBULATORY_CARE_PROVIDER_SITE_OTHER): Admitting: Internal Medicine

## 2016-09-20 VITALS — BP 138/91 | HR 94 | Temp 98.3°F | Wt 255.0 lb

## 2016-09-20 DIAGNOSIS — G8929 Other chronic pain: Secondary | ICD-10-CM

## 2016-09-20 DIAGNOSIS — M25571 Pain in right ankle and joints of right foot: Secondary | ICD-10-CM | POA: Diagnosis not present

## 2016-09-20 DIAGNOSIS — M00071 Staphylococcal arthritis, right ankle and foot: Secondary | ICD-10-CM

## 2016-09-20 MED ORDER — GABAPENTIN 300 MG PO CAPS: 300.0000 mg | ORAL_CAPSULE | Freq: Two times a day (BID) | ORAL | 1 refills | Status: DC

## 2016-09-20 MED ORDER — DOXYCYCLINE HYCLATE 100 MG PO TABS: 100.0000 mg | ORAL_TABLET | Freq: Two times a day (BID) | ORAL | 2 refills | Status: AC

## 2016-09-20 NOTE — Progress Notes (Signed)
Patient ID: Scott Hebert, male   DOB: [birth year], 56 y.o.   MRN: [id number]  HPI Patient is a 56 year old gentleman who presents in follow-up status post MRSA infection of the left ankle for which she underwent debridement and IV antibiotics and underwent fusion of his ankle. he most recently has completed then switched to oral  course of doxy on 7/30 which he was to take til now. Roughly 8 wk  Lab Results  Component Value Date   ESRSEDRATE 16 08/12/2016   Lab Results  Component Value Date   CRP 6.7 08/12/2016     Assessment & Plan: Visit Diagnoses:  1. S/P ankle fusion   2. Staphylococcal arthritis of right ankle [facility])      Outpatient Encounter Prescriptions as of 09/20/2016  Medication Sig  . aspirin EC 325 MG tablet Take 1 tablet (325 mg total) by mouth daily.  . diclofenac sodium (VOLTAREN) 1 % GEL Apply 2 g topically 4 (four) times daily.  . traZODone (DESYREL) 50 MG tablet Take 1 tablet (50 mg total) by mouth at bedtime as needed for sleep. Start taking 1/2 tab at bedtime i  . aspirin EC 325 MG tablet Take 1 tablet (325 mg total) by mouth daily. (Patient not taking: Reported on 09/20/2016)  . doxycycline (VIBRA-TABS) 100 MG tablet Take 1 tablet (100 mg total) by mouth 2 (two) times daily. (Patient not taking: Reported on 09/20/2016)  . naproxen sodium (ANAPROX) 220 MG tablet Take 400 mg by mouth 2 (two) times daily with a meal.  . ondansetron (ZOFRAN-ODT) 8 MG disintegrating tablet Take 1 tablet (8 mg total) by mouth 2 (two) times daily. Take 60 minutes prior to doxycycline. (Patient not taking: Reported on 08/12/2016)  . oxyCODONE-acetaminophen (ROXICET) 5-325 MG tablet Take 1 tablet by mouth 2 (two) times daily as needed for severe pain. (Patient not taking: Reported on 08/12/2016)  . traMADol (ULTRAM) 50 MG tablet Take 2 tablets (100 mg total) by mouth every 12 (twelve) hours as needed. Maximum dose= 8 tablets per day (Patient not taking: Reported on 09/20/2016)   No  facility-administered encounter medications on file as of 09/20/2016.      Patient Active Problem List   Diagnosis Date Noted  . S/P ankle fusion 06/09/2016  . 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 MRSA infection   . Therapeutic drug monitoring   . Phlebitis after infusion   . Septic arthritis of right ankle ([place]) 05/26/2016  . 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 Closed extra-articular fracture of distal end of right tibia   . Closed fracture of distal end of fibula, unspecified fracture morphology, initial encounter 01/17/2016  . Allergy 12/07/2011     Health Maintenance Due  Topic Date Due  . TETANUS/TDAP  05/19/1979  . COLONOSCOPY  05/19/2010  . INFLUENZA VACCINE  08/11/2016     Review of Systems  Physical Exam   BP (!) 138/91   Pulse 94   Temp 98.3 F (36.8 C) (Oral)   Wt 255 lb (115.7 kg)   BMI 37.66 kg/m    No results found for: CD4TCELL No results found for: CD4TABS No results found for: HIV1RNAQUANT No results found for: HEPBSAB No results found for: RPR, LABRPR  CBC Lab Results  Component Value Date   WBC 4.8 07/12/2016   RBC 4.81 07/12/2016   HGB 11.7 (L) 07/12/2016   HCT 37.6 (L) 07/12/2016   PLT 239 07/12/2016   MCV 78.2 (L)  07/12/2016   MCH 24.3 (L) 07/12/2016   MCHC 31.1 (L) 07/12/2016   RDW 17.8 (H) 07/12/2016   LYMPHSABS 1,632 07/12/2016   MONOABS 480 07/12/2016   EOSABS 192 07/12/2016    BMET Lab Results  Component Value Date   NA 134 (L) 07/12/2016   K 4.6 07/12/2016   CL 99 07/12/2016   CO2 26 07/12/2016   GLUCOSE 114 (H) 07/12/2016   BUN 14 07/12/2016   CREATININE 1.02 07/12/2016   CALCIUM 9.8 07/12/2016   GFRNONAA >60 06/01/2016   GFRAA >60 06/01/2016      Assessment and Plan  mssa osteomyelitis of ankle = will restart on doxy for chronic suppression plus provide some anti inflammation  Pain = - will do a trial of neurontin at bedtime to see if any relief if symptoms

## 2016-09-27 ENCOUNTER — Ambulatory Visit (INDEPENDENT_AMBULATORY_CARE_PROVIDER_SITE_OTHER): Payer: Self-pay

## 2016-09-27 ENCOUNTER — Encounter (INDEPENDENT_AMBULATORY_CARE_PROVIDER_SITE_OTHER): Payer: Self-pay | Admitting: Orthopedic Surgery

## 2016-09-27 ENCOUNTER — Ambulatory Visit (INDEPENDENT_AMBULATORY_CARE_PROVIDER_SITE_OTHER): Admitting: Orthopedic Surgery

## 2016-09-27 ENCOUNTER — Ambulatory Visit (INDEPENDENT_AMBULATORY_CARE_PROVIDER_SITE_OTHER)

## 2016-09-27 DIAGNOSIS — M21371 Foot drop, right foot: Secondary | ICD-10-CM | POA: Insufficient documentation

## 2016-09-27 DIAGNOSIS — M00071 Staphylococcal arthritis, right ankle and foot: Secondary | ICD-10-CM

## 2016-09-27 DIAGNOSIS — Z981 Arthrodesis status: Secondary | ICD-10-CM | POA: Diagnosis not present

## 2016-09-27 MED ORDER — OXYCODONE-ACETAMINOPHEN 5-325 MG PO TABS
1.0000 | ORAL_TABLET | Freq: Two times a day (BID) | ORAL | 0 refills | Status: DC | PRN
Start: 2016-09-27 — End: 2016-10-12

## 2016-09-27 NOTE — Progress Notes (Signed)
Office Visit Note   Patient: Scott Hebert           Date of Birth: May 08, 1960           MRN: [id number] Visit Date: 09/27/2016              Requested by: No referring provider defined for this encounter. PCP: Patient, [name]  Chief Complaint  Patient presents with  . Right Ankle - Follow-up    Right Ankle I&D right fibula septic arthritis. ~ 4 months post op      HPI: Patient is a 56 year old gentleman status post septic ankle from infected hardware ankle fracture. Patient subsequent underwent instillation therapy IV antibiotics and excision of the infected bone and ankle fusion. Patient has been doing well except recently patient is noticed foot drop of the toes on the right foot. Patient denies any specific new lower back pain or symptoms. Denies any sensory deficits in the right foot. Patient does not have diabetes.  Assessment & Plan: Visit Diagnoses:  1. Foot drop, right foot   2. Staphylococcal arthritis of right ankle (HCC)   3. S/P ankle fusion     Plan: We will refer patient to Dr. Ernestina Patches for nerve conduction studies. EMG to the anterior tibial tendon EHL and posterior tibial tendon. Continue with the fracture boot weightbearing as tolerated.  Follow-Up Instructions: Return in about 3 weeks (around 10/18/2016).   Ortho Exam  Patient is alert, oriented, no adenopathy, well-dressed, normal affect, normal respiratory effort. Examination there is no redness no cellulitis no signs of infection there is no drainage of the ankle. Patient does have increased swelling in the posterior tibial tendon region. She does have some increased pain on the plantar aspect of his foot. There is no redness no cellulitis. Patient has foot drop of the toes with no active EHL or extension of the lesser toes. Patient states this started with the great toe and then worked its way over to the lesser toes. He has good flexion strength of the toe his anterior tibial tendon is  intact.  Imaging: Xr Ankle Complete Right  Result Date: 09/27/2016 Three-view radiographs the right ankle show stable alignment cutting features no new lytic changes there is increased callus formation.  No images are attached to the encounter.  Labs: Lab Results  Component Value Date   ESRSEDRATE 16 08/12/2016   ESRSEDRATE 38 (H) 07/12/2016   ESRSEDRATE 74 (H) 05/27/2016   CRP 6.7 08/12/2016   CRP 12.5 (H) 07/12/2016   CRP 3.9 (H) 05/27/2016   REPTSTATUS 06/01/2016 FINAL 05/26/2016   GRAMSTAIN  05/26/2016    ABUNDANT WBC PRESENT,BOTH PMN AND MONONUCLEAR NO ORGANISMS SEEN    CULT NO GROWTH 5 DAYS 05/26/2016   LABORGA METHICILLIN RESISTANT STAPHYLOCOCCUS AUREUS 05/26/2016    Orders:  Orders Placed This Encounter  Procedures  . XR Ankle Complete Right  . XR Lumbar Spine 2-3 Views  . Ambulatory referral to Physical Medicine Rehab   No orders of the defined types were placed in this encounter.    Procedures: No procedures performed  Clinical Data: No additional findings.  ROS:  All other systems negative, except as noted in the HPI. Review of Systems  Objective: Vital Signs: There were no vitals taken for this visit.  Specialty Comments:  No specialty comments available.  PMFS History: Patient Active Problem List   Diagnosis Date Noted  . Foot drop, right foot 09/27/2016  . S/P ankle fusion 06/09/2016  . Acute  hematogenous osteomyelitis of right fibula ([place])   . MRSA infection   . Therapeutic drug monitoring   . Phlebitis after infusion   . Septic arthritis of right ankle ([place]) 05/26/2016  . 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 Closed extra-articular fracture of distal end of right tibia   . Closed fracture of distal end of fibula, unspecified fracture morphology, initial encounter 01/17/2016  . Allergy 12/07/2011   Past Medical History:  Diagnosis Date  . Allergy   . Arthritis    "right ankle" (05/26/2016)  . GERD  (gastroesophageal reflux disease)   . Infection    RLE; MRSA    Family History  Problem Relation Age of Onset  . Cancer Mother     Past Surgical History:  Procedure Laterality Date  . ANKLE ARTHROSCOPY Right 05/26/2016  . ANKLE ARTHROSCOPY Right 05/26/2016   Procedure: Irrigation and Debridement Right Fibula and Arthroscopic Debridement Right Ankle, Apply Wound VAC;  Surgeon: Newt Minion, MD;  Location: [place];  Service: Orthopedics;  Laterality: Right;  . ANKLE FUSION Right 05/31/2016   Procedure: IRRIGATION AND DEBRIDEMENT RIGHT ANKLE WITH FUSION;  Surgeon: Newt Minion, MD;  Location: [employer] [facility];  Service: Orthopedics;  Laterality: Right;  . APPLICATION OF WOUND VAC  05/26/2016  . FRACTURE SURGERY    . HARDWARE REMOVAL Right 03/19/2016   Procedure: HARDWARE REMOVAL RIGHT ANKLE;  Surgeon: Newt Minion, MD;  Location: [place];  Service: Orthopedics;  Laterality: Right;  . ORIF ANKLE FRACTURE Right 01/18/2016   Procedure: OPEN REDUCTION INTERNAL FIXATION (ORIF) ANKLE FRACTURE;  Surgeon: Newt Minion, MD;  Location: [place];  Service: Orthopedics;  Laterality: Right;  . TIBIA DEBRIDEMENT Right 05/26/2016   Social History   Occupational History  . [employer]   Social History Main Topics  . Smoking status: Current Every Day Smoker    Packs/day: 0.12    Years: 14.00    Types: Cigarettes  . Smokeless tobacco: Never Used  . Alcohol use No     Comment: 05/26/2016 "stopped drinking in 01/2016"  . Drug use: No  . Sexual activity: Yes    Partners: Female    Birth control/ protection: None

## 2016-10-08 ENCOUNTER — Ambulatory Visit (INDEPENDENT_AMBULATORY_CARE_PROVIDER_SITE_OTHER): Admitting: Physical Medicine and Rehabilitation

## 2016-10-08 ENCOUNTER — Encounter (INDEPENDENT_AMBULATORY_CARE_PROVIDER_SITE_OTHER): Payer: Self-pay | Admitting: Physical Medicine and Rehabilitation

## 2016-10-08 DIAGNOSIS — R531 Weakness: Secondary | ICD-10-CM

## 2016-10-08 NOTE — Progress Notes (Signed)
Right lower extremity. Loss of function of toes on right foot for about three weeks. No numbness or tingling.

## 2016-10-12 ENCOUNTER — Telehealth (INDEPENDENT_AMBULATORY_CARE_PROVIDER_SITE_OTHER): Payer: Self-pay | Admitting: Radiology

## 2016-10-12 ENCOUNTER — Telehealth (INDEPENDENT_AMBULATORY_CARE_PROVIDER_SITE_OTHER): Payer: Self-pay

## 2016-10-12 ENCOUNTER — Other Ambulatory Visit (INDEPENDENT_AMBULATORY_CARE_PROVIDER_SITE_OTHER): Payer: Self-pay | Admitting: Orthopedic Surgery

## 2016-10-12 MED ORDER — OXYCODONE-ACETAMINOPHEN 5-325 MG PO TABS: 1.0000 | ORAL_TABLET | Freq: Two times a day (BID) | ORAL | 0 refills | Status: DC | PRN

## 2016-10-12 NOTE — Telephone Encounter (Signed)
Patient would like Rx refill on Oxycodone.  Cb# is [phone].  Please advise.  Thank You.

## 2016-10-12 NOTE — Telephone Encounter (Signed)
Prescription for oxycodone given to patient in office. Advised him that for future reference we will not be able to stop clinic to write for rx, we need 24-48 hours to respond to prescription request.

## 2016-10-13 NOTE — Progress Notes (Signed)
Scott Hebert - 56 y.o. male MRN [id number]  Date of birth: [birth year]  Office Visit Note: Visit Date: 10/08/2016 PCP: Patient, [name] Referred by: Newt Minion, MD  Subjective: Chief Complaint  Patient presents with  . Right Foot - Weakness   HPI: Scott Hebert is a 56 year old gentleman accompanied by his wife who provides the history. 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  Over the last 3 weeks without specific trauma he reports losing the function of the toes on the right foot. He denies any numbness but he does have altered sensation. He reports having to step higher to clear his foot and his foot really cannot be lifted in his toes cannot be lifted and extension. He can flex the toes. Left-sided issues. He has no frank back pain or radicular pain.    ROS Otherwise per HPI.  Assessment & Plan: Visit Diagnoses:  1. Weakness     Plan: No additional findings.  Impression: This electrodiagnostic study is ABNORMAL but is unfortunately very difficult to interpret fully because of extensive surgery and ankle fusion. Without truly being able to localize the lesion, this could be a severe L5 radiculopathy versus a common fibular nerve mononeuropathy. The patient did seem to have a fairly normal needle EMG of the posterior tibialis which would lead to do believe this is more of fibular nerve problem instead of L5 radiculopathy but again very hard to determine.  Recommendations: 1.  Follow-up with referring physician. Patient will likely need AFO. 2.  Suggest MRI lumbar spine and surgical/clinical evaluation for fibular nerve involvement.  Meds & Orders: No orders of the defined types were placed in this encounter.   Orders Placed This Encounter  Procedures  . NCV with EMG (electromyography)    Follow-up: No Follow-up on file.   Procedures: No procedures performed  EMG & NCV Findings: Evaluation of the right Dp Br  Fibular motor nerve showed prolonged distal onset latency (Fib Head, 11.5 ms), prolonged distal onset latency (Poplit, 14.8 ms), and decreased conduction velocity (Poplit-Fib Head, 30 m/s).  The right fibular motor nerve showed no response (Ankle) and no response (B Fib).  The right tibial motor nerve showed no response (Ankle).    Needle evaluation of the right Fibularis Longus muscle showed increased spontaneous activity, increased motor unit amplitude, and diminished recruitment.  The right extensor hallucis longus and the right extensor digitorum brevis muscles showed increased spontaneous activity and diminished recruitment.  All remaining muscles (as indicated in the following table) showed no evidence of electrical instability.    Impression: This electrodiagnostic study is ABNORMAL but is unfortunately very difficult to interpret fully because of extensive surgery and ankle fusion. Without truly being able to localize the lesion, this could be a severe L5 radiculopathy versus a common fibular nerve mononeuropathy. The patient did seem to have a fairly normal needle EMG of the posterior tibialis which would lead to do believe this is more of fibular nerve problem instead of L5 radiculopathy but again very hard to determine.  Recommendations: 1.  Follow-up with referring physician. Patient will likely need AFO. 2.  Suggest MRI lumbar spine and surgical/clinical evaluation for fibular nerve involvement.    Nerve Conduction Studies Motor Summary Table   Stim Site NR Onset (ms) Norm Onset (ms) O-P Amp (mV) Norm O-P Amp Site1 Site2 Delta-0 (ms) Dist (cm) Vel (m/s) Norm Vel (m/s)  Right Dp Br Fibular Motor (AntTibialis)  32.1C  Fib Head    *11.5 <4.2 0.2  Poplit Fib Head 3.3 10.0 *30 >40.5  Poplit    *14.8 <5.7 0.5         Right Fibular Motor (Ext Dig Brev)  31.7C  Ankle *NR  <6.1  >2.5 B Fib Ankle  0.0  >38  B Fib *NR            Right Tibial Motor (Abd Hall Brev)  32.2C  Ankle *NR  <6.1   >3.0         EMG   Side Muscle Nerve Root Ins Act Fibs Psw Amp Dur Poly Recrt Int Fraser Din Comment  Right AntTibialis Dp Br Peron L4-5 Nml *3+ *3+ Nml Nml 0 *Reduced Nml   Right Fibularis Longus  Sup Br Peron L5-S1 Nml *3+ *3+ *Incr Nml 0 *Reduced Nml + muap  Right MedGastroc Tibial S1-2 Nml Nml Nml Nml Nml 0 Nml Nml   Right VastusMed Femoral L2-4 Nml Nml Nml Nml Nml 0 Nml Nml   Right ExtHallLong Dp Br Peron L5, S1 Nml *3+ *3+ Nml Nml 0 *Reduced Nml - muap  Right Ext Dig Brev Dp Br Peron L5, S1 Nml *3+ *3+ Nml Nml 0 *Reduced Nml - muap  Right PostTibialis Tibial L5, S1 Nml Nml Nml Nml Nml 0 Nml Nml     Nerve Conduction Studies Motor Left/Right Comparison   Stim Site L Lat (ms) R Lat (ms) L-R Lat (ms) L Amp (mV) R Amp (mV) L-R Amp (%) Site1 Site2 L Vel (m/s) R Vel (m/s) L-R Vel (m/s)  Dp Br Fibular Motor (AntTibialis)  32.1C  Fib Head  *11.5   0.2  Poplit Fib Head  *30   Poplit  *14.8   0.5        Fibular Motor (Ext Dig Brev)  31.7C  Ankle       B Fib Ankle     B Fib             Tibial Motor (Abd Hall Brev)  32.2C  Ankle                Waveforms:        Clinical History: No specialty comments available.  He reports that he has been smoking Cigarettes.  He has a 1.68 pack-year smoking history. He has never used smokeless tobacco. No results for input(s): HGBA1C, LABURIC in the last 8760 hours.  Objective:  VS:  HT:    WT:   BMI:     BP:   HR: bpm  TEMP: ( )  RESP:  Physical Exam  Musculoskeletal:  Examination of the right lower limb shows ankle fusion with well-healed surgical scars. There is some mild edema. There are no signs of complex regional pain syndrome such as allodynia or trophic changes. He does have some atrophy of the EDB musculature. He has decreased sensation really in a more superficial peroneal distribution with some spared sensation in the first web space although it is diminished from one side or the other. The pattern of decreased sensation seems to be  more fibular nerve and L5 dermatome. He has bilateral downgoing toes. He has no clonus.    Ortho Exam Imaging: No results found.  Past Medical/Family/Surgical/Social History: Medications & Allergies reviewed per EMR Patient Active Problem List   Diagnosis Date Noted  . Foot drop, right foot 09/27/2016  . S/P ankle fusion 06/09/2016  . Acute hematogenous osteomyelitis of right fibula ([place])   . MRSA  infection   . Therapeutic drug monitoring   . Phlebitis after infusion   . Septic arthritis of right ankle ([place]) 05/26/2016  . 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 Closed extra-articular fracture of distal end of right tibia   . Closed fracture of distal end of fibula, unspecified fracture morphology, initial encounter 01/17/2016  . Allergy 12/07/2011   Past Medical History:  Diagnosis Date  . Allergy   . Arthritis    "right ankle" (05/26/2016)  . GERD (gastroesophageal reflux disease)   . Infection    RLE; MRSA   Family History  Problem Relation Age of Onset  . Cancer Mother    Past Surgical History:  Procedure Laterality Date  . ANKLE ARTHROSCOPY Right 05/26/2016  . ANKLE ARTHROSCOPY Right 05/26/2016   Procedure: Irrigation and Debridement Right Fibula and Arthroscopic Debridement Right Ankle, Apply Wound VAC;  Surgeon: Newt Minion, MD;  Location: [facility];  Service: Orthopedics;  Laterality: Right;  . ANKLE FUSION Right 05/31/2016   Procedure: IRRIGATION AND DEBRIDEMENT RIGHT ANKLE WITH FUSION;  Surgeon: Newt Minion, MD;  Location: [facility];  Service: Orthopedics;  Laterality: Right;  . APPLICATION OF WOUND VAC  05/26/2016  . FRACTURE SURGERY    . HARDWARE REMOVAL Right 03/19/2016   Procedure: HARDWARE REMOVAL RIGHT ANKLE;  Surgeon: Newt Minion, MD;  Location: [facility];  Service: Orthopedics;  Laterality: Right;  . ORIF ANKLE FRACTURE Right 01/18/2016   Procedure: OPEN REDUCTION INTERNAL FIXATION (ORIF) ANKLE FRACTURE;  Surgeon: Newt Minion, MD;  Location: [facility];   Service: Orthopedics;  Laterality: Right;  . TIBIA DEBRIDEMENT Right 05/26/2016   Social History   Occupational History  . [employer]   Social History Main Topics  . Smoking status: Current Every Day Smoker    Packs/day: 0.12    Years: 14.00    Types: Cigarettes  . Smokeless tobacco: Never Used  . Alcohol use No     Comment: 05/26/2016 "stopped drinking in 01/2016"  . Drug use: No  . Sexual activity: Yes    Partners: Female    Birth control/ protection: None

## 2016-10-13 NOTE — Procedures (Signed)
EMG & NCV Findings: Evaluation of the right Dp Br Fibular motor nerve showed prolonged distal onset latency (Fib Head, 11.5 ms), prolonged distal onset latency (Poplit, 14.8 ms), and decreased conduction velocity (Poplit-Fib Head, 30 m/s).  The right fibular motor nerve showed no response (Ankle) and no response (B Fib).  The right tibial motor nerve showed no response (Ankle).    Needle evaluation of the right Fibularis Longus muscle showed increased spontaneous activity, increased motor unit amplitude, and diminished recruitment.  The right extensor hallucis longus and the right extensor digitorum brevis muscles showed increased spontaneous activity and diminished recruitment.  All remaining muscles (as indicated in the following table) showed no evidence of electrical instability.    Impression: This electrodiagnostic study is ABNORMAL but is unfortunately very difficult to interpret fully because of extensive surgery and ankle fusion. Without truly being able to localize the lesion, this could be a severe L5 radiculopathy versus a common fibular nerve mononeuropathy. The patient did seem to have a fairly normal needle EMG of the posterior tibialis which would lead to do believe this is more of fibular nerve problem instead of L5 radiculopathy but again very hard to determine.  Recommendations: 1.  Follow-up with referring physician. Patient will likely need AFO. 2.  Suggest MRI lumbar spine and surgical/clinical evaluation for fibular nerve involvement.    Nerve Conduction Studies Motor Summary Table   Stim Site NR Onset (ms) Norm Onset (ms) O-P Amp (mV) Norm O-P Amp Site1 Site2 Delta-0 (ms) Dist (cm) Vel (m/s) Norm Vel (m/s)  Right Dp Br Fibular Motor (AntTibialis)  32.1C  Fib Head    *11.5 <4.2 0.2  Poplit Fib Head 3.3 10.0 *30 >40.5  Poplit    *14.8 <5.7 0.5         Right Fibular Motor (Ext Dig Brev)  31.7C  Ankle *NR  <6.1  >2.5 B Fib Ankle  0.0  >38  B Fib *NR            Right  Tibial Motor (Abd Hall Brev)  32.2C  Ankle *NR  <6.1  >3.0         EMG   Side Muscle Nerve Root Ins Act Fibs Psw Amp Dur Poly Recrt Int Fraser Din Comment  Right AntTibialis Dp Br Peron L4-5 Nml *3+ *3+ Nml Nml 0 *Reduced Nml   Right Fibularis Longus  Sup Br Peron L5-S1 Nml *3+ *3+ *Incr Nml 0 *Reduced Nml + muap  Right MedGastroc Tibial S1-2 Nml Nml Nml Nml Nml 0 Nml Nml   Right VastusMed Femoral L2-4 Nml Nml Nml Nml Nml 0 Nml Nml   Right ExtHallLong Dp Br Peron L5, S1 Nml *3+ *3+ Nml Nml 0 *Reduced Nml - muap  Right Ext Dig Brev Dp Br Peron L5, S1 Nml *3+ *3+ Nml Nml 0 *Reduced Nml - muap  Right PostTibialis Tibial L5, S1 Nml Nml Nml Nml Nml 0 Nml Nml     Nerve Conduction Studies Motor Left/Right Comparison   Stim Site L Lat (ms) R Lat (ms) L-R Lat (ms) L Amp (mV) R Amp (mV) L-R Amp (%) Site1 Site2 L Vel (m/s) R Vel (m/s) L-R Vel (m/s)  Dp Br Fibular Motor (AntTibialis)  32.1C  Fib Head  *11.5   0.2  Poplit Fib Head  *30   Poplit  *14.8   0.5        Fibular Motor (Ext Dig Brev)  31.7C  Ankle       B Fib Ankle  B Fib             Tibial Motor (Abd Hall Brev)  32.2C  Ankle                Waveforms:

## 2016-10-18 ENCOUNTER — Encounter: Payer: Self-pay | Admitting: Internal Medicine

## 2016-10-18 ENCOUNTER — Ambulatory Visit (INDEPENDENT_AMBULATORY_CARE_PROVIDER_SITE_OTHER): Admitting: Licensed Clinical Social Worker

## 2016-10-18 ENCOUNTER — Ambulatory Visit (INDEPENDENT_AMBULATORY_CARE_PROVIDER_SITE_OTHER): Admitting: Internal Medicine

## 2016-10-18 VITALS — BP 146/95 | HR 105 | Temp 98.3°F | Ht 69.0 in | Wt 254.0 lb

## 2016-10-18 DIAGNOSIS — M869 Osteomyelitis, unspecified: Secondary | ICD-10-CM

## 2016-10-18 DIAGNOSIS — G8929 Other chronic pain: Secondary | ICD-10-CM | POA: Diagnosis not present

## 2016-10-18 DIAGNOSIS — M00071 Staphylococcal arthritis, right ankle and foot: Secondary | ICD-10-CM | POA: Diagnosis not present

## 2016-10-18 DIAGNOSIS — F0631 Mood disorder due to known physiological condition with depressive features: Secondary | ICD-10-CM

## 2016-10-18 DIAGNOSIS — M25571 Pain in right ankle and joints of right foot: Secondary | ICD-10-CM | POA: Diagnosis not present

## 2016-10-18 MED ORDER — DULOXETINE HCL 60 MG PO CPEP: 60.0000 mg | ORAL_CAPSULE | Freq: Every day | ORAL | 3 refills | Status: DC

## 2016-10-18 MED ORDER — GABAPENTIN 300 MG PO CAPS: 300.0000 mg | ORAL_CAPSULE | Freq: Three times a day (TID) | ORAL | 2 refills | Status: AC

## 2016-10-18 NOTE — Progress Notes (Signed)
RFV: staph aureus osteo of left ankle  Patient ID: Scott Hebert, male   DOB: Jun 04, 1960, 56 y.o.   MRN: [id number]  HPI Patient is a 56 year old gentleman who presents in follow-up status post MRSA infection of the left ankle for which she underwent debridement and IV antibiotics and underwent fusion of his ankle. he most recently has completed then switched to oral  course of doxy on 7/30 which he was to take til now. At the last visit, he started to have what was thought to be early signs of foot drop, where he tripped over his great toe. He was seen by dr duda the following week who also suspected new foot drop and coordinated visit to see dr Ernestina Patches who conducted NCS which were abnormal. He was placed in a boot and now has upcoming mri of spine to find etiology of his foot drop  He reports feeling depressed since having foot drop both throbbing as well as neuropathic pain. No back pain.  He is feeling depressed with the thought that he won't be able to resume his prior job.   Outpatient Encounter Prescriptions as of 10/18/2016  Medication Sig  . aspirin EC 325 MG tablet Take 1 tablet (325 mg total) by mouth daily.  . diclofenac sodium (VOLTAREN) 1 % GEL Apply 2 g topically 4 (four) times daily.  Marland Kitchen doxycycline (VIBRA-TABS) 100 MG tablet Take 1 tablet (100 mg total) by mouth 2 (two) times daily.  Marland Kitchen gabapentin (NEURONTIN) 300 MG capsule Take 1 capsule (300 mg total) by mouth 2 (two) times daily. Start only to take at bedtime x 7 days  . naproxen sodium (ANAPROX) 220 MG tablet Take 400 mg by mouth 2 (two) times daily with a meal.  . oxyCODONE-acetaminophen (ROXICET) 5-325 MG tablet Take 1 tablet by mouth 2 (two) times daily as needed for severe pain.  . traZODone (DESYREL) 50 MG tablet Take 1 tablet (50 mg total) by mouth at bedtime as needed for sleep. Start taking 1/2 tab at bedtime i   No facility-administered encounter medications on file as of 10/18/2016.      Patient Active Problem  List   Diagnosis Date Noted  . Foot drop, right foot 09/27/2016  . S/P ankle fusion 06/09/2016  . Acute hematogenous osteomyelitis of right fibula ([place])   . MRSA infection   . Therapeutic drug monitoring   . Phlebitis after infusion   . Septic arthritis of right ankle ([place]) 05/26/2016  . 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 Closed extra-articular fracture of distal end of right tibia   . Closed fracture of distal end of fibula, unspecified fracture morphology, initial encounter 01/17/2016  . Allergy 12/07/2011     Health Maintenance Due  Topic Date Due  . TETANUS/TDAP  05/19/1979  . COLONOSCOPY  05/19/2010  . INFLUENZA VACCINE  08/11/2016     Review of Systems Review of Systems  Constitutional: Negative for fever, chills, diaphoresis, activity change, appetite change, fatigue and unexpected weight change.  HENT: Negative for congestion, sore throat, rhinorrhea, sneezing, trouble swallowing and sinus pressure.  Eyes: Negative for photophobia and visual disturbance.  Respiratory: Negative for cough, chest tightness, shortness of breath, wheezing and stridor.  Cardiovascular: Negative for chest pain, palpitations and leg swelling.  Gastrointestinal: Negative for nausea, vomiting, abdominal pain, diarrhea, constipation, blood in stool, abdominal distention and anal bleeding.  Genitourinary: Negative for dysuria, hematuria, flank pain and difficulty urinating.  Musculoskeletal: right ankle pain Skin: Negative for color change,  pallor, rash and wound.  Neurological: occasional sharp pain radiating up leg Hematological: Negative for adenopathy. Does not bruise/bleed easily.  Psychiatric/Behavioral: Negative for behavioral problems, confusion, sleep disturbance, dysphoric mood, decreased concentration and agitation.    Physical Exam   BP (!) 146/95   Pulse (!) 105   Temp 98.3 F (36.8 C) (Oral)   Ht 5\' 9"  (1.753 m)   Wt 254 lb (115.2 kg)   BMI 37.51 kg/m     Physical Exam  Constitutional: He is oriented to person, place, and time. He appears well-developed and well-nourished. No distress.  HENT:  Mouth/Throat: Oropharynx is clear and moist. No oropharyngeal exudate.  Cardiovascular: Normal rate, regular rhythm and normal heart sounds. Exam reveals no gallop and no friction rub.  No murmur heard.  Pulmonary/Chest: Effort normal and breath sounds normal. No respiratory distress. He has no wheezes.  Abdominal: Soft. Bowel sounds are normal. He exhibits no distension. There is no tenderness.  Lymphadenopathy:  He has no cervical adenopathy.  Neurological: unable extend toes, sensation intact but has tenderness greater than expected to ankle Skin: Skin is warm and dry. No rash noted. No erythema.  Psychiatric: He has a normal mood and affect. His behavior is normal.    CBC Lab Results  Component Value Date   WBC 4.8 07/12/2016   RBC 4.81 07/12/2016   HGB 11.7 (L) 07/12/2016   HCT 37.6 (L) 07/12/2016   PLT 239 07/12/2016   MCV 78.2 (L) 07/12/2016   MCH 24.3 (L) 07/12/2016   MCHC 31.1 (L) 07/12/2016   RDW 17.8 (H) 07/12/2016   LYMPHSABS 1,632 07/12/2016   MONOABS 480 07/12/2016   EOSABS 192 07/12/2016    BMET Lab Results  Component Value Date   NA 134 (L) 07/12/2016   K 4.6 07/12/2016   CL 99 07/12/2016   CO2 26 07/12/2016   GLUCOSE 114 (H) 07/12/2016   BUN 14 07/12/2016   CREATININE 1.02 07/12/2016   CALCIUM 9.8 07/12/2016   GFRNONAA >60 06/01/2016   GFRAA >60 06/01/2016   Lab Results  Component Value Date   ESRSEDRATE 36 (H) 10/18/2016   Lab Results  Component Value Date   CRP 13.2 (H) 10/18/2016      Assessment and Plan  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 Will check sed rate and crp. Inflammatory would be elevated if having increase pain.   Foot drop = has upcoming mri to investigate etiology  Ankle pain = will increase neurontin to 300mg  TID instead of BID. Will also add cymbalta to help with pain  control. Currently on opiates through dr duda's office to help with breakthrough pain.

## 2016-10-18 NOTE — Patient Instructions (Signed)
Please increase gabapentin to 3 times per day (instead of twice a day)  Start taking cymbalta (which will help with pain as well as as depression)

## 2016-10-19 ENCOUNTER — Other Ambulatory Visit: Payer: Self-pay | Admitting: Internal Medicine

## 2016-10-19 LAB — SEDIMENTATION RATE: Sed Rate: 36 mm/h — ABNORMAL HIGH (ref 0–20)

## 2016-10-19 LAB — C-REACTIVE PROTEIN: CRP: 13.2 mg/L — ABNORMAL HIGH (ref <8.0)

## 2016-10-19 NOTE — BH Specialist Note (Signed)
Integrated Behavioral Health Initial Visit  MRN: [id number] Name: Scott Hebert  Number of [place] Clinician visits:: 1/6 Session Start time: 4:30 pm  Session End time: 4:44 pm Total time: 15 minutes  Type of Service: [place] Interpretor:No. Interpretor Name and Language: N/A   Warm Hand Off Completed.       SUBJECTIVE: Scott Hebert is a 56 y.o. male accompanied by Spouse Patient was referred by Dr. Baxter Flattery for depressive symptoms.  Patient reports the following symptoms/concerns: Mood is "depressed" but includes frustration and "anger" due to the amount of time he has been out of work and the lack of knowledge that has been shared about his medical condition.  Patient shared that he feels his medical condition has deteriorated after being treated for an infection and now he cannot move his toes.  Patient presented with fears that medical information is being withheld from him and that he won't be able to return to work soon.  Patient verbalized that he has formed his identtity around work and being able to provide financially for his family.  Now that he cannot work and does not know when he can return to work, his personal identity is been questioned.  Additionally he stated that being out on disability income, which is limited, is also a source of stress in caring for his family.  However he is hopeful about becoming well and returning to work soon.  The patient also verbalized that his foot is painful and he is having difficulty managing the chronic pain.  The patient reported that he is taking prescription sleep medication to assist with pain management and ability to sleep.  Patient reported that the Trazadone is working well and he has no issues with waking up in the morning.  Patient also reported that he is able to socialize with others and denied social isolation.  Patient denied anhedonia but stated that he cannot do  much due to wearing the boot on his foot.  Patient denied appetite or concentration changes and denied current specific suicidal ideations, plan or intent.  It is hopeful that when the patient is provided with medical information about his foot, his pain level is reduced, and he is able to increase ambulation and return to work, the patient's situational depression will resolve.  It is not advisable at this time to schedule weekly in-office behavioral health services with the patient who reported pain wearing the boot to walk, who is experiencing chronic pain, and cannot bend his toes.  Additionally his right foot is in a boot and he cannot drive wearing the boot.  His wife stated that she has to take off of work to drive him to his appointments, further causing financial strain on the family.  When the patient's physical health symptoms improve and he can safely report to the office weekly, [facility] will assess symptoms to determine patient's needs and schedule appointment at that time.  In managing medical care and decreasing stress and frustration, patient was receptive to need to advocate for himself, ask questions, seek clarity, take notes, and as suggested by his wife, seek a second opinion when needed.  Duration of problem: January 2018; Severity of problem: mild  OBJECTIVE: Mood: Angry, Depressed and frustrated and Affect: within range Risk of harm to self or others: No plan to harm self or others  LIFE CONTEXT: Family and Social: Patient lives with his wife. School/Work: Patient has limited mobility due to severe foot pain and  inability to move his toes.  He is unable to work at this time. Self-Care: Patient is able to tend to his ADL's. Life Changes: Patient is learning how to manage his foot injury, a MRSA infection from surgery, and mobility challenges, while advocating for himself and his medical treatment.  ASSESSMENT: Patient is currently experiencing anger, depression, and frustration due  to his current medical issues, inability to work, and lack of communication from his medical providers.  Patient may benefit from behavioral health services, increased communication with providers, and medication management.  GOALS ADDRESSED: Patient will: 1. Reduce symptoms of: depression and stress 2. Increase knowledge and/or ability of: coping skills, stress reduction and self-advocacy  3. Demonstrate ability to: Increase healthy adjustment to current life circumstances and Increase adequate support systems for patient/family  INTERVENTIONS: Interventions utilized: Supportive Counseling, Sleep Hygiene and Psychoeducation and/or Health Education   PLAN:  1. When the patient's physical health symptoms improve and he can safely report to the office weekly, [facility] will assess symptoms to determine patient's needs and schedule appointment at that time.    Sande Rives, [facility]

## 2016-10-20 ENCOUNTER — Ambulatory Visit (INDEPENDENT_AMBULATORY_CARE_PROVIDER_SITE_OTHER): Admitting: Orthopedic Surgery

## 2016-10-25 ENCOUNTER — Encounter: Payer: Self-pay | Admitting: Internal Medicine

## 2016-10-26 ENCOUNTER — Encounter: Payer: Self-pay | Admitting: Internal Medicine

## 2016-10-26 ENCOUNTER — Ambulatory Visit (INDEPENDENT_AMBULATORY_CARE_PROVIDER_SITE_OTHER): Admitting: Orthopedic Surgery

## 2016-10-26 ENCOUNTER — Encounter (INDEPENDENT_AMBULATORY_CARE_PROVIDER_SITE_OTHER): Payer: Self-pay | Admitting: Orthopedic Surgery

## 2016-10-26 DIAGNOSIS — G90521 Complex regional pain syndrome I of right lower limb: Secondary | ICD-10-CM

## 2016-10-26 DIAGNOSIS — M00071 Staphylococcal arthritis, right ankle and foot: Secondary | ICD-10-CM

## 2016-10-26 DIAGNOSIS — S82301D Unspecified fracture of lower end of right tibia, subsequent encounter for closed fracture with routine healing: Secondary | ICD-10-CM

## 2016-10-26 MED ORDER — OXYCODONE-ACETAMINOPHEN 5-325 MG PO TABS: 1.0000 | ORAL_TABLET | Freq: Two times a day (BID) | ORAL | 0 refills | Status: DC | PRN

## 2016-10-26 NOTE — Progress Notes (Signed)
Office Visit Note   Patient: Scott Hebert           Date of Birth: 1960-06-05           MRN: [id number] Visit Date: 10/26/2016              Requested by: No referring provider defined for this encounter. PCP: Patient, [name]  Chief Complaint  Patient presents with  . Right Ankle - Pain      HPI: Patient is a 56 year old gentleman who presents in follow-up status post fusion on the right. Patient states he started having foot drop which started with the right great toe and then eventually involve the other toes he denies any specific trauma or injury is just slowly happened over time. Patient did undergo a nerve conduction study with Dr. Ernestina Patches this appears more like involvement of the common peroneal nerve more so than lumbar radiculopathy. Patient states that his toes hurt or cramping. Patient has been started on Cymbalta 60 mg every morning and 300 mg 3 times a day. Patient states that he is not sure how often he is taking the Neurontin and recommended that he follow the prescription on the bottle.  Assessment & Plan: Visit Diagnoses:  1. Closed extra-articular fracture of distal end of right tibia with routine healing, subsequent encounter   2. Staphylococcal arthritis of right ankle (HCC)   3. Complex regional pain syndrome type 1 of right lower extremity     Plan: recommended continue taking Neurontin 3 times a day Cymbalta every morning continue with the fracture boot. Discussed the with the sympathetic pain Neurontin and Cymbalta for the best medications he is given a refill prescription for Percocet discussed the this may need to be the last prescription. Discussed that nerve recovery from a spontaneous complex regional pain syndrome or spontaneous foot drop may take a long time to recover.  Discussed the patient may be permanently disabled from this injury. It is unknown if he will recover from the pain or the foot drop. May require a pain clinic for evaluation. We will  see how he responds to the Neurontin and Cymbalta at the appropriate doses.  Follow-Up Instructions: Return in about 4 weeks (around 11/23/2016).   Ortho Exam  Patient is alert, oriented, no adenopathy, well-dressed, normal affect, normal respiratory effort. Examination patient does have hypersensitivity to light touch involving the entire foot and ankle. There is no skin color or temperature changes there is no increased sweating or dryness. The skin texture seems normal. Patient's foot is plantar grade.  Imaging: No results found. No images are attached to the encounter.  Labs: Lab Results  Component Value Date   ESRSEDRATE 36 (H) 10/18/2016   ESRSEDRATE 16 08/12/2016   ESRSEDRATE 38 (H) 07/12/2016   CRP 13.2 (H) 10/18/2016   CRP 6.7 08/12/2016   CRP 12.5 (H) 07/12/2016   REPTSTATUS 06/01/2016 FINAL 05/26/2016   GRAMSTAIN  05/26/2016    ABUNDANT WBC PRESENT,BOTH PMN AND MONONUCLEAR NO ORGANISMS SEEN    CULT NO GROWTH 5 DAYS 05/26/2016   LABORGA METHICILLIN RESISTANT STAPHYLOCOCCUS AUREUS 05/26/2016    Orders:  No orders of the defined types were placed in this encounter.  Meds ordered this encounter  Medications  . oxyCODONE-acetaminophen (ROXICET) 5-325 MG tablet    Sig: Take 1 tablet by mouth 2 (two) times daily as needed for severe pain.    Dispense:  20 tablet    Refill:  0     Procedures:  No procedures performed  Clinical Data: No additional findings.  ROS:  All other systems negative, except as noted in the HPI. Review of Systems  Objective: Vital Signs: There were no vitals taken for this visit.  Specialty Comments:  No specialty comments available.  PMFS History: Patient Active Problem List   Diagnosis Date Noted  . Complex regional pain syndrome type 1 of right lower extremity 10/26/2016  . Foot drop, right foot 09/27/2016  . S/P ankle fusion 06/09/2016  . 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 MRSA infection   .  Therapeutic drug monitoring   . Phlebitis after infusion   . Septic arthritis of right ankle ([place]) 05/26/2016  . 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 Closed extra-articular fracture of distal end of right tibia   . Closed fracture of distal end of fibula, unspecified fracture morphology, initial encounter 01/17/2016  . Allergy 12/07/2011   Past Medical History:  Diagnosis Date  . Allergy   . Arthritis    "right ankle" (05/26/2016)  . GERD (gastroesophageal reflux disease)   . Infection    RLE; MRSA    Family History  Problem Relation Age of Onset  . Cancer Mother     Past Surgical History:  Procedure Laterality Date  . ANKLE ARTHROSCOPY Right 05/26/2016  . ANKLE ARTHROSCOPY Right 05/26/2016   Procedure: Irrigation and Debridement Right Fibula and Arthroscopic Debridement Right Ankle, Apply Wound VAC;  Surgeon: Newt Minion, MD;  Location: [facility];  Service: Orthopedics;  Laterality: Right;  . ANKLE FUSION Right 05/31/2016   Procedure: IRRIGATION AND DEBRIDEMENT RIGHT ANKLE WITH FUSION;  Surgeon: Newt Minion, MD;  Location: [facility];  Service: Orthopedics;  Laterality: Right;  . APPLICATION OF WOUND VAC  05/26/2016  . FRACTURE SURGERY    . HARDWARE REMOVAL Right 03/19/2016   Procedure: HARDWARE REMOVAL RIGHT ANKLE;  Surgeon: Newt Minion, MD;  Location: [facility];  Service: Orthopedics;  Laterality: Right;  . ORIF ANKLE FRACTURE Right 01/18/2016   Procedure: OPEN REDUCTION INTERNAL FIXATION (ORIF) ANKLE FRACTURE;  Surgeon: Newt Minion, MD;  Location: [facility];  Service: Orthopedics;  Laterality: Right;  . TIBIA DEBRIDEMENT Right 05/26/2016   Social History   Occupational History  . [employer]   Social History Main Topics  . Smoking status: Current Every Day Smoker    Packs/day: 0.12    Years: 14.00    Types: Cigarettes  . Smokeless tobacco: Never Used  . Alcohol use No     Comment: 05/26/2016 "stopped drinking in 01/2016"  . Drug use: No    . Sexual activity: Yes    Partners: Female    Birth control/ protection: None

## 2016-11-22 ENCOUNTER — Encounter: Payer: Self-pay | Admitting: Internal Medicine

## 2016-11-22 ENCOUNTER — Ambulatory Visit (INDEPENDENT_AMBULATORY_CARE_PROVIDER_SITE_OTHER): Admitting: Internal Medicine

## 2016-11-22 VITALS — BP 135/92 | HR 92 | Temp 98.3°F | Ht 69.0 in | Wt 263.0 lb

## 2016-11-22 DIAGNOSIS — G8929 Other chronic pain: Secondary | ICD-10-CM

## 2016-11-22 DIAGNOSIS — M25571 Pain in right ankle and joints of right foot: Secondary | ICD-10-CM

## 2016-11-22 DIAGNOSIS — F0631 Mood disorder due to known physiological condition with depressive features: Secondary | ICD-10-CM

## 2016-11-22 DIAGNOSIS — R197 Diarrhea, unspecified: Secondary | ICD-10-CM

## 2016-11-22 DIAGNOSIS — M86171 Other acute osteomyelitis, right ankle and foot: Secondary | ICD-10-CM

## 2016-11-22 NOTE — Progress Notes (Signed)
Patient ID: Scott Hebert, male   DOB: December 18, 1960, 56 y.o.   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 Still having significant pain with his right foot when he ambulates. Has some relief with gabapentin but it does make him sleepy. He suffers from associated depression since not being able to work. He was started on cymbalta at last visit for dual purpose of synergy for pain meds and depression. He has noticed Diarrhea since starting cymbalta - started taking immodium  Needs pain management referral -  Outpatient Encounter Medications as of 11/22/2016  Medication Sig  . aspirin EC 325 MG tablet Take 1 tablet (325 mg total) by mouth daily.  . diclofenac sodium (VOLTAREN) 1 % GEL Apply 2 g topically 4 (four) times daily.  Marland Kitchen doxycycline (VIBRA-TABS) 100 MG tablet Take 1 tablet (100 mg total) by mouth 2 (two) times daily.  . DULoxetine (CYMBALTA) 60 MG capsule Take 1 capsule (60 mg total) by mouth daily. Start with 1/2 tablet for the first week then full tablet thereafter  . gabapentin (NEURONTIN) 300 MG capsule Take 1 capsule (300 mg total) by mouth 3 (three) times daily. Start only to take at bedtime x 7 days  . naproxen sodium (ANAPROX) 220 MG tablet Take 400 mg by mouth 2 (two) times daily with a meal.  . traZODone (DESYREL) 50 MG tablet Take 1 tablet (50 mg total) by mouth at bedtime as needed for sleep. Start taking 1/2 tab at bedtime i  . [DISCONTINUED] DULoxetine (CYMBALTA) 30 MG capsule TAKE 1 CAPSULE BY MOUTH ONCE DAILY FOR 7 DAYS THEN START 60 MG DOSE  . [DISCONTINUED] oxyCODONE-acetaminophen (ROXICET) 5-325 MG tablet Take 1 tablet by mouth 2 (two) times daily as needed for severe pain.   No facility-administered encounter medications on file as of 11/22/2016.      Patient Active Problem List   Diagnosis Date Noted    . Complex regional pain syndrome type 1 of right lower extremity 10/26/2016  . Foot drop, right foot 09/27/2016  . S/P ankle fusion 06/09/2016  . 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 MRSA infection   . Therapeutic drug monitoring   . Phlebitis after infusion   . Septic arthritis of right ankle ([place]) 05/26/2016  . 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 Closed extra-articular fracture of distal end of right tibia   . Closed fracture of distal end of fibula, unspecified fracture morphology, initial encounter 01/17/2016  . Allergy 12/07/2011     Health Maintenance Due  Topic Date Due  . TETANUS/TDAP  05/19/1979  . COLONOSCOPY  05/19/2010  . INFLUENZA VACCINE  08/11/2016     Review of Systems +right ankle pain, +depression.+diarrhea, +foot drop otherwise 12 point ros is negative Physical Exam   BP (!) 135/92   Pulse 92   Temp 98.3 F (36.8 C) (Oral)   Ht 5\' 9"  (1.753 m)   Wt 263 lb (119.3 kg)   BMI 38.84 kg/m   Physical Exam  Constitutional: He is oriented to person, place, and time. He appears well-developed and well-nourished. No distress.  HENT:  Mouth/Throat: Oropharynx is clear and moist. No oropharyngeal exudate.  Cardiovascular: Normal rate, regular rhythm and normal heart sounds. Exam reveals no gallop and no friction rub.  No murmur  heard.  Pulmonary/Chest: Effort normal and breath sounds normal. No respiratory distress. He has no wheezes.  Neurological: He is alert and oriented to person, place, and time.  Skin: Skin is warm and dry. No rash noted. No erythema.  Psychiatric: flat affect    CBC Lab Results  Component Value Date   WBC 4.8 07/12/2016   RBC 4.81 07/12/2016   HGB 11.7 (L) 07/12/2016   HCT 37.6 (L) 07/12/2016   PLT 239 07/12/2016   MCV 78.2 (L) 07/12/2016   MCH 24.3 (L) 07/12/2016   MCHC 31.1 (L) 07/12/2016   RDW 17.8 (H) 07/12/2016   LYMPHSABS 1,632 07/12/2016   MONOABS 480 07/12/2016   EOSABS 192  07/12/2016    BMET Lab Results  Component Value Date   NA 134 (L) 07/12/2016   K 4.6 07/12/2016   CL 99 07/12/2016   CO2 26 07/12/2016   GLUCOSE 114 (H) 07/12/2016   BUN 14 07/12/2016   CREATININE 1.02 07/12/2016   CALCIUM 9.8 07/12/2016   GFRNONAA >60 06/01/2016   GFRAA >60 06/01/2016   Lab Results  Component Value Date   ESRSEDRATE 39 (H) 11/22/2016      Assessment and Plan  mrsa ankle infection = continue on doxy bid, will check sed rate. Appears to still need to continue. Plan for addn 2 months.   Pain management = will refer to Dr mark phillips to help.  Depression = continue with cymbalta  Diarrhea = continue with prn imodium

## 2016-11-23 ENCOUNTER — Ambulatory Visit (INDEPENDENT_AMBULATORY_CARE_PROVIDER_SITE_OTHER): Admitting: Orthopedic Surgery

## 2016-11-23 LAB — C-REACTIVE PROTEIN: CRP: 15.7 mg/L — AB (ref <8.0)

## 2016-11-23 LAB — SEDIMENTATION RATE: Sed Rate: 39 mm/h — ABNORMAL HIGH (ref 0–20)

## 2016-12-01 ENCOUNTER — Ambulatory Visit (INDEPENDENT_AMBULATORY_CARE_PROVIDER_SITE_OTHER): Admitting: Orthopedic Surgery

## 2016-12-09 ENCOUNTER — Encounter (INDEPENDENT_AMBULATORY_CARE_PROVIDER_SITE_OTHER): Payer: Self-pay | Admitting: Orthopedic Surgery

## 2016-12-09 ENCOUNTER — Ambulatory Visit (INDEPENDENT_AMBULATORY_CARE_PROVIDER_SITE_OTHER): Admitting: Orthopedic Surgery

## 2016-12-09 VITALS — Ht 69.0 in | Wt 263.0 lb

## 2016-12-09 DIAGNOSIS — G90521 Complex regional pain syndrome I of right lower limb: Secondary | ICD-10-CM | POA: Diagnosis not present

## 2016-12-09 DIAGNOSIS — Z981 Arthrodesis status: Secondary | ICD-10-CM | POA: Diagnosis not present

## 2016-12-10 ENCOUNTER — Encounter (INDEPENDENT_AMBULATORY_CARE_PROVIDER_SITE_OTHER): Payer: Self-pay | Admitting: Orthopedic Surgery

## 2016-12-10 ENCOUNTER — Telehealth (INDEPENDENT_AMBULATORY_CARE_PROVIDER_SITE_OTHER): Payer: Self-pay | Admitting: Radiology

## 2016-12-10 DIAGNOSIS — G90521 Complex regional pain syndrome I of right lower limb: Secondary | ICD-10-CM

## 2016-12-10 DIAGNOSIS — G90522 Complex regional pain syndrome I of left lower limb: Secondary | ICD-10-CM

## 2016-12-10 NOTE — Progress Notes (Signed)
Office Visit Note   Patient: Scott Hebert           Date of Birth: [birth year]           MRN: [id number] Visit Date: 12/09/2016              Requested by: No referring provider defined for this encounter. PCP: Patient, [name]  Chief Complaint  Patient presents with  . Right Ankle - Follow-up    Right I&D partial excision right distal fibula 05/2016      HPI: Patient is 6 months status post right ankle fusion status post infection of the fibula with removal of the distal fibula as well as fusion of the right ankle joint.  Radiographs obtained 2 months ago showed stable internal fixation without any hardware collapse.  Patient has been on Neurontin and Cymbalta for treatment of his complex regional pain syndrome patient states that these medicines make him sleepy but do not relieve his symptoms of pain.  Assessment & Plan: Visit Diagnoses:  1. Complex regional pain syndrome type 1 of right lower extremity   2. S/P ankle fusion     Plan: Patient's overriding problems seem to be his complex regional pain syndrome.  We will set him up for consultation for pain management.  Patient may benefit from sympathetic nerve block.  Patient has no clinical signs of infection patient has no clinical signs of nonunion of the fusion.  Patient expressed interest in a total ankle arthroplasty.  Discussed that with his history of MRSA infection a total ankle arthroplasty is not an option and with his distal fibular resection that is also a factor that would eliminate the option of total ankle arthroplasty.  Patient was given a prescription for [employer] for extra-depth shoes orthotics and a double upright brace.  The double upright brace may help relieve symptoms from stress through the foot and ankle.  Follow-Up Instructions: Return in about 6 weeks (around 01/20/2017).   Ortho Exam  Patient is alert, oriented, no adenopathy, well-dressed, normal affect, normal respiratory effort. On examination  patient's foot is plantigrade.  His foot and ankle are hypersensitive to light touch he has no redness no cellulitis there is some swelling there are no open wounds no signs of infection.  Patient is globally tender around the foot and ankle to light touch.  Radiographs are reviewed from 2 months ago which shows no destructive lytic bony changes no hardware failure there is good callus formation across the fusion site.  Patient complains of decreased range of motion of his toes and states that he occasionally has toe spasm and movement.  The decreased range of motion is most likely due to scar adhesions to the extensor tendons.  Imaging: No results found. No images are attached to the encounter.  Labs: Lab Results  Component Value Date   ESRSEDRATE 39 (H) 11/22/2016   ESRSEDRATE 36 (H) 10/18/2016   ESRSEDRATE 16 08/12/2016   CRP 15.7 (H) 11/22/2016   CRP 13.2 (H) 10/18/2016   CRP 6.7 08/12/2016   REPTSTATUS 06/01/2016 FINAL 05/26/2016   GRAMSTAIN  05/26/2016    ABUNDANT WBC PRESENT,BOTH PMN AND MONONUCLEAR NO ORGANISMS SEEN    CULT NO GROWTH 5 DAYS 05/26/2016   LABORGA METHICILLIN RESISTANT STAPHYLOCOCCUS AUREUS 05/26/2016    @LABSALLVALUES (HGBA1)@  @BMI1 @  Orders:  Orders Placed This Encounter  Procedures  . Ambulatory referral to Pain Clinic   No orders of the defined types were placed in this encounter.  Procedures: No procedures performed  Clinical Data: No additional findings.  ROS:  All other systems negative, except as noted in the HPI. Review of Systems  Objective: Vital Signs: Ht 5\' 9"  (1.753 m)   Wt 263 lb (119.3 kg)   BMI 38.84 kg/m   Specialty Comments:  No specialty comments available.  PMFS History: Patient Active Problem List   Diagnosis Date Noted  . Complex regional pain syndrome type 1 of right lower extremity 10/26/2016  . Foot drop, right foot 09/27/2016  . S/P ankle fusion 06/09/2016  . Acute hematogenous osteomyelitis of right  fibula ([place])   . MRSA infection   . Therapeutic drug monitoring   . Phlebitis after infusion   . Septic arthritis of right ankle ([place]) 05/26/2016  . 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 Closed extra-articular fracture of distal end of right tibia   . Closed fracture of distal end of fibula, unspecified fracture morphology, initial encounter 01/17/2016  . Allergy 12/07/2011   Past Medical History:  Diagnosis Date  . Allergy   . Arthritis    "right ankle" (05/26/2016)  . GERD (gastroesophageal reflux disease)   . Infection    RLE; MRSA    Family History  Problem Relation Age of Onset  . Cancer Mother     Past Surgical History:  Procedure Laterality Date  . ANKLE ARTHROSCOPY Right 05/26/2016  . ANKLE ARTHROSCOPY Right 05/26/2016   Procedure: Irrigation and Debridement Right Fibula and Arthroscopic Debridement Right Ankle, Apply Wound VAC;  Surgeon: Newt Minion, MD;  Location: [facility];  Service: Orthopedics;  Laterality: Right;  . ANKLE FUSION Right 05/31/2016   Procedure: IRRIGATION AND DEBRIDEMENT RIGHT ANKLE WITH FUSION;  Surgeon: Newt Minion, MD;  Location: [facility];  Service: Orthopedics;  Laterality: Right;  . APPLICATION OF WOUND VAC  05/26/2016  . FRACTURE SURGERY    . HARDWARE REMOVAL Right 03/19/2016   Procedure: HARDWARE REMOVAL RIGHT ANKLE;  Surgeon: Newt Minion, MD;  Location: [facility];  Service: Orthopedics;  Laterality: Right;  . ORIF ANKLE FRACTURE Right 01/18/2016   Procedure: OPEN REDUCTION INTERNAL FIXATION (ORIF) ANKLE FRACTURE;  Surgeon: Newt Minion, MD;  Location: [facility];  Service: Orthopedics;  Laterality: Right;  . TIBIA DEBRIDEMENT Right 05/26/2016   Social History   Occupational History  . Occupation: Psychologist, occupational: [employer]  Tobacco Use  . Smoking status: Current Every Day Smoker    Packs/day: 0.12    Years: 14.00    Pack years: 1.68    Types: Cigarettes  . Smokeless tobacco: Never Used  Substance and Sexual  Activity  . Alcohol use: No    Alcohol/week: 0.0 - 2.0 oz    Comment: 05/26/2016 "stopped drinking in 01/2016"  . Drug use: No  . Sexual activity: Yes    Partners: Female    Birth control/protection: None

## 2016-12-10 NOTE — Telephone Encounter (Signed)
Referral placed for sympathetic nerve block for complex regional pain syndrome right lower extremity. Will refer to [facility], who have done these types of injections in the past.

## 2016-12-20 ENCOUNTER — Other Ambulatory Visit: Payer: Self-pay | Admitting: Internal Medicine

## 2016-12-24 ENCOUNTER — Telehealth (INDEPENDENT_AMBULATORY_CARE_PROVIDER_SITE_OTHER): Payer: Self-pay | Admitting: Orthopedic Surgery

## 2016-12-24 NOTE — Telephone Encounter (Signed)
12/09/2016 OV NOTE FAXED [employer] [id number]

## 2017-01-11 DIAGNOSIS — E119 Type 2 diabetes mellitus without complications: Secondary | ICD-10-CM | POA: Insufficient documentation

## 2017-01-20 ENCOUNTER — Ambulatory Visit (INDEPENDENT_AMBULATORY_CARE_PROVIDER_SITE_OTHER): Admitting: Orthopedic Surgery

## 2017-01-27 ENCOUNTER — Ambulatory Visit (INDEPENDENT_AMBULATORY_CARE_PROVIDER_SITE_OTHER)

## 2017-01-27 ENCOUNTER — Encounter (INDEPENDENT_AMBULATORY_CARE_PROVIDER_SITE_OTHER): Payer: Self-pay | Admitting: Orthopedic Surgery

## 2017-01-27 ENCOUNTER — Ambulatory Visit: Admitting: Internal Medicine

## 2017-01-27 ENCOUNTER — Ambulatory Visit (INDEPENDENT_AMBULATORY_CARE_PROVIDER_SITE_OTHER): Admitting: Orthopedic Surgery

## 2017-01-27 VITALS — Ht 69.0 in | Wt 263.0 lb

## 2017-01-27 DIAGNOSIS — Z981 Arthrodesis status: Secondary | ICD-10-CM | POA: Diagnosis not present

## 2017-01-27 MED ORDER — OXYCODONE-ACETAMINOPHEN 5-325 MG PO TABS: 1.0000 | ORAL_TABLET | Freq: Four times a day (QID) | ORAL | 0 refills | Status: DC | PRN

## 2017-01-27 NOTE — Progress Notes (Signed)
Office Visit Note   Patient: Scott Hebert           Date of Birth: January 15, 1960           MRN: [id number] Visit Date: 01/27/2017              Requested by: No referring provider defined for this encounter. PCP: Patient, [name]  Chief Complaint  Patient presents with  . Right Ankle - Follow-up    Right ankle I&D partial excision right distal excision right distal fib. CRPS      HPI: Patient is a 57 year old gentleman who presents status post ankle fracture subsequent infection irrigation and debridement and fusion of the ankle.  Patient had developed increasing pain with complex regional pain syndrome.  Patient was scheduled for a brace but his co-pay was too expensive.  He has been walking and regular shoewear.  Patient has been to his primary care physician and was told that his vitamin D3 level was extremely low he has been started on vitamin D3.  Assessment & Plan: Visit Diagnoses:  1. 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  Discussed that the low D3  level has contributed to this fibrous nonunion.  We will plan for removal of the internal fixation with revision fusion of the ankle.  Patient states he would like to call to schedule the surgery.  Follow-Up Instructions: Return in about 2 weeks (around 02/10/2017).   Ortho Exam  Patient is alert, oriented, no adenopathy, well-dressed, normal affect, normal respiratory effort. Examination patient has essentially no swelling around the ankle.  There is no redness no cellulitis he has no hypersensitivity to light touch his skin color and temperature is normal.  Patient states he still has decreased range of motion of his toes he does have active extension through the EHL but there is very minimal motion.  He has active plantar flexion to the toes but there is also minimal motion as well.  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  No images are attached to the encounter.  Labs: Lab Results  Component Value Date   ESRSEDRATE 39 (H) 11/22/2016   ESRSEDRATE 36 (H) 10/18/2016   ESRSEDRATE 16 08/12/2016   CRP 15.7 (H) 11/22/2016   CRP 13.2 (H) 10/18/2016   CRP 6.7 08/12/2016   REPTSTATUS 06/01/2016 FINAL 05/26/2016   GRAMSTAIN  05/26/2016    ABUNDANT WBC PRESENT,BOTH PMN AND MONONUCLEAR NO ORGANISMS SEEN    CULT NO GROWTH 5 DAYS 05/26/2016   LABORGA METHICILLIN RESISTANT STAPHYLOCOCCUS AUREUS 05/26/2016    @LABSALLVALUES (HGBA1)@  Body mass index is 38.84 kg/m.  Orders:  Orders Placed This Encounter  Procedures  . XR Ankle Complete Right   Meds ordered this encounter  Medications  . oxyCODONE-acetaminophen (PERCOCET/ROXICET) 5-325 MG tablet    Sig: Take 1 tablet by mouth every 6 (six) hours as needed for severe pain.    Dispense:  30 tablet    Refill:  0     Procedures: No procedures performed  Clinical Data: No additional findings.  ROS:  All other systems negative, except as noted in the HPI. Review of Systems  Objective: Vital Signs: Ht 5\' 9"  (1.753 m)   Wt 263 lb (119.3 kg)   BMI 38.84 kg/m   Specialty Comments:  No specialty  comments available.  PMFS History: Patient Active Problem List   Diagnosis Date Noted  . Complex regional pain syndrome type 1 of right lower extremity 10/26/2016  . Foot drop, right foot 09/27/2016  . S/P ankle fusion 06/09/2016  . 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 MRSA infection   . Therapeutic drug monitoring   . Phlebitis after infusion   . Septic arthritis of right ankle ([place]) 05/26/2016  . 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 Closed extra-articular fracture of distal end of right tibia   . Closed fracture of distal end of fibula, unspecified fracture morphology, initial encounter 01/17/2016  . Allergy  12/07/2011   Past Medical History:  Diagnosis Date  . Allergy   . Arthritis    "right ankle" (05/26/2016)  . GERD (gastroesophageal reflux disease)   . Infection    RLE; MRSA    Family History  Problem Relation Age of Onset  . Cancer Mother     Past Surgical History:  Procedure Laterality Date  . ANKLE ARTHROSCOPY Right 05/26/2016  . ANKLE ARTHROSCOPY Right 05/26/2016   Procedure: Irrigation and Debridement Right Fibula and Arthroscopic Debridement Right Ankle, Apply Wound VAC;  Surgeon: Newt Minion, MD;  Location: [facility];  Service: Orthopedics;  Laterality: Right;  . ANKLE FUSION Right 05/31/2016   Procedure: IRRIGATION AND DEBRIDEMENT RIGHT ANKLE WITH FUSION;  Surgeon: Newt Minion, MD;  Location: [facility];  Service: Orthopedics;  Laterality: Right;  . APPLICATION OF WOUND VAC  05/26/2016  . FRACTURE SURGERY    . HARDWARE REMOVAL Right 03/19/2016   Procedure: HARDWARE REMOVAL RIGHT ANKLE;  Surgeon: Newt Minion, MD;  Location: [facility];  Service: Orthopedics;  Laterality: Right;  . ORIF ANKLE FRACTURE Right 01/18/2016   Procedure: OPEN REDUCTION INTERNAL FIXATION (ORIF) ANKLE FRACTURE;  Surgeon: Newt Minion, MD;  Location: [facility];  Service: Orthopedics;  Laterality: Right;  . TIBIA DEBRIDEMENT Right 05/26/2016   Social History   Occupational History  . Occupation: Psychologist, occupational: [employer]  Tobacco Use  . Smoking status: Current Every Day Smoker    Packs/day: 0.12    Years: 14.00    Pack years: 1.68    Types: Cigarettes  . Smokeless tobacco: Never Used  Substance and Sexual Activity  . Alcohol use: No    Alcohol/week: 0.0 - 2.0 oz    Comment: 05/26/2016 "stopped drinking in 01/2016"  . Drug use: No  . Sexual activity: Yes    Partners: Female    Birth control/protection: None

## 2017-02-14 ENCOUNTER — Other Ambulatory Visit (INDEPENDENT_AMBULATORY_CARE_PROVIDER_SITE_OTHER): Payer: Self-pay | Admitting: Family

## 2017-02-16 ENCOUNTER — Telehealth (INDEPENDENT_AMBULATORY_CARE_PROVIDER_SITE_OTHER): Payer: Self-pay | Admitting: Orthopedic Surgery

## 2017-02-16 NOTE — Telephone Encounter (Signed)
Patient called stating that he is having surgery next Wednesday with Dr. Sharol Given.  Patient called requesting an RX refill of pain pills enough to last him until the day of surgery.  CB#[phone].  Thank you.

## 2017-02-17 ENCOUNTER — Other Ambulatory Visit (INDEPENDENT_AMBULATORY_CARE_PROVIDER_SITE_OTHER): Payer: Self-pay | Admitting: Family

## 2017-02-17 MED ORDER — OXYCODONE-ACETAMINOPHEN 5-325 MG PO TABS: 1.0000 | ORAL_TABLET | Freq: Three times a day (TID) | ORAL | 0 refills | Status: DC | PRN

## 2017-02-17 NOTE — Telephone Encounter (Signed)
Please advise 

## 2017-02-17 NOTE — Telephone Encounter (Signed)
Called pt to advise rx at desk. Pt wants to drop off paperwork for [place] to review for disability advised this would be ok.

## 2017-02-22 ENCOUNTER — Other Ambulatory Visit: Payer: Self-pay

## 2017-02-22 ENCOUNTER — Encounter (HOSPITAL_COMMUNITY): Payer: Self-pay | Admitting: *Deleted

## 2017-02-22 ENCOUNTER — Ambulatory Visit: Admitting: Internal Medicine

## 2017-02-22 MED ORDER — CEFAZOLIN SODIUM 10 G IJ SOLR
3.0000 g | INTRAMUSCULAR | Status: AC
  Administered 2017-02-23: 3 g via INTRAVENOUS
  Filled 2017-02-22: qty 3

## 2017-02-22 NOTE — Progress Notes (Signed)
Pt denies SOB, chest pain, and being under the care of a cardiologist. Pt denies having a stress test, echo and cardiac cath. Pt denies having a chest x ray within the last year. Pt denies recent labs. Pt made aware to stop taking vitamins, fish oil and herbal medications. Do not take any NSAIDs ie: Ibuprofen, Advil, Naproxen (Aleve), Motrin, BC and Goody Powder. Pt verbalized understanding of all pre-op instructions. 

## 2017-02-22 NOTE — Anesthesia Preprocedure Evaluation (Signed)
Anesthesia Evaluation  Patient identified by MRN, date of birth, ID band Patient awake    Reviewed: Allergy & Precautions, H&P , NPO status , Patient's Chart, lab work & pertinent test results  Airway Mallampati: III  TM Distance: >3 FB Neck ROM: Full    Dental no notable dental hx. (+) Teeth Intact, Dental Advisory Given   Pulmonary Current Smoker,    Pulmonary exam normal breath sounds clear to auscultation       Cardiovascular negative cardio ROS   Rhythm:Regular Rate:Normal     Neuro/Psych  Headaches, PSYCHIATRIC DISORDERS Depression negative psych ROS   GI/Hepatic Neg liver ROS, GERD  Medicated and Controlled,  Endo/Other  negative endocrine ROSMorbid obesity  Renal/GU negative Renal ROS  negative genitourinary   Musculoskeletal  (+) Arthritis , Osteoarthritis,    Abdominal   Peds  Hematology negative hematology ROS (+)   Anesthesia Other Findings   Reproductive/Obstetrics negative OB ROS                             Anesthesia Physical  Anesthesia Plan  ASA: III  Anesthesia Plan: General   Post-op Pain Management:    Induction: Intravenous  PONV Risk Score and Plan:   Airway Management Planned: LMA  Additional Equipment:   Intra-op Plan:   Post-operative Plan: Extubation in OR  Informed Consent: I have reviewed the patients History and Physical, chart, labs and discussed the procedure including the risks, benefits and alternatives for the proposed anesthesia with the patient or authorized representative who has indicated his/her understanding and acceptance.   Dental advisory given  Plan Discussed with: CRNA and Anesthesiologist  Anesthesia Plan Comments:         Anesthesia Quick Evaluation

## 2017-02-23 ENCOUNTER — Other Ambulatory Visit: Payer: Self-pay

## 2017-02-23 ENCOUNTER — Ambulatory Visit (INDEPENDENT_AMBULATORY_CARE_PROVIDER_SITE_OTHER): Admitting: Family

## 2017-02-23 ENCOUNTER — Ambulatory Visit (HOSPITAL_COMMUNITY): Admitting: Anesthesiology

## 2017-02-23 ENCOUNTER — Encounter (HOSPITAL_COMMUNITY)
Admission: RE | Disposition: A | Discharge status: Home / Self Care | Payer: Self-pay | Source: Ambulatory Visit | Attending: Orthopedic Surgery

## 2017-02-23 ENCOUNTER — Ambulatory Visit (HOSPITAL_COMMUNITY)
Admission: RE | Admit: 2017-02-23 | Discharge: 2017-02-23 | Disposition: A | Discharge status: Home / Self Care | Source: Ambulatory Visit | Attending: Orthopedic Surgery | Admitting: Orthopedic Surgery

## 2017-02-23 ENCOUNTER — Encounter (HOSPITAL_COMMUNITY): Payer: Self-pay | Admitting: *Deleted

## 2017-02-23 DIAGNOSIS — Z79891 Long term (current) use of opiate analgesic: Secondary | ICD-10-CM | POA: Insufficient documentation

## 2017-02-23 DIAGNOSIS — Z791 Long term (current) use of non-steroidal anti-inflammatories (NSAID): Secondary | ICD-10-CM | POA: Insufficient documentation

## 2017-02-23 DIAGNOSIS — M19071 Primary osteoarthritis, right ankle and foot: Secondary | ICD-10-CM | POA: Insufficient documentation

## 2017-02-23 DIAGNOSIS — R7303 Prediabetes: Secondary | ICD-10-CM | POA: Diagnosis not present

## 2017-02-23 DIAGNOSIS — Z7982 Long term (current) use of aspirin: Secondary | ICD-10-CM | POA: Diagnosis not present

## 2017-02-23 DIAGNOSIS — M85072 Fibrous dysplasia (monostotic), left ankle and foot: Secondary | ICD-10-CM

## 2017-02-23 DIAGNOSIS — Z9889 Other specified postprocedural states: Secondary | ICD-10-CM | POA: Diagnosis not present

## 2017-02-23 DIAGNOSIS — Z91018 Allergy to other foods: Secondary | ICD-10-CM | POA: Diagnosis not present

## 2017-02-23 DIAGNOSIS — Z981 Arthrodesis status: Secondary | ICD-10-CM | POA: Diagnosis not present

## 2017-02-23 DIAGNOSIS — Z91013 Allergy to seafood: Secondary | ICD-10-CM | POA: Insufficient documentation

## 2017-02-23 DIAGNOSIS — M960 Pseudarthrosis after fusion or arthrodesis: Secondary | ICD-10-CM | POA: Insufficient documentation

## 2017-02-23 DIAGNOSIS — Z79899 Other long term (current) drug therapy: Secondary | ICD-10-CM | POA: Insufficient documentation

## 2017-02-23 DIAGNOSIS — F329 Major depressive disorder, single episode, unspecified: Secondary | ICD-10-CM | POA: Diagnosis not present

## 2017-02-23 DIAGNOSIS — F17210 Nicotine dependence, cigarettes, uncomplicated: Secondary | ICD-10-CM | POA: Insufficient documentation

## 2017-02-23 DIAGNOSIS — Z6836 Body mass index (BMI) 36.0-36.9, adult: Secondary | ICD-10-CM | POA: Diagnosis not present

## 2017-02-23 DIAGNOSIS — A4902 Methicillin resistant Staphylococcus aureus infection, unspecified site: Secondary | ICD-10-CM

## 2017-02-23 HISTORY — DX: Prediabetes: R73.03

## 2017-02-23 HISTORY — DX: Depression, unspecified: F32.A

## 2017-02-23 HISTORY — DX: Encounter for follow-up examination after completed treatment for conditions other than malignant neoplasm: Z09

## 2017-02-23 HISTORY — DX: Headache: R51

## 2017-02-23 HISTORY — PX: ANKLE FUSION: SHX5718

## 2017-02-23 HISTORY — DX: Major depressive disorder, single episode, unspecified: F32.9

## 2017-02-23 HISTORY — DX: Headache, unspecified: R51.9

## 2017-02-23 HISTORY — DX: Personal history of (healed) traumatic fracture: Z87.81

## 2017-02-23 LAB — CBC
HCT: 42.4 % (ref 39.0–52.0)
Hemoglobin: 13.5 g/dL (ref 13.0–17.0)
MCH: 26.8 pg (ref 26.0–34.0)
MCHC: 31.8 g/dL (ref 30.0–36.0)
MCV: 84.3 fL (ref 78.0–100.0)
PLATELETS: 165 10*3/uL (ref 150–400)
RBC: 5.03 MIL/uL (ref 4.22–5.81)
RDW: 16.6 % — AB (ref 11.5–15.5)
WBC: 6.6 10*3/uL (ref 4.0–10.5)

## 2017-02-23 LAB — BASIC METABOLIC PANEL
Anion gap: 12 (ref 5–15)
BUN: 13 mg/dL (ref 6–20)
CO2: 21 mmol/L — ABNORMAL LOW (ref 22–32)
CREATININE: 0.8 mg/dL (ref 0.61–1.24)
Calcium: 9.1 mg/dL (ref 8.9–10.3)
Chloride: 107 mmol/L (ref 101–111)
GFR calc Af Amer: >60 mL/min (ref >60)
GLUCOSE: 113 mg/dL — AB (ref 65–99)
Potassium: 3.8 mmol/L (ref 3.5–5.1)
SODIUM: 140 mmol/L (ref 135–145)

## 2017-02-23 LAB — HEMOGLOBIN A1C
HEMOGLOBIN A1C: 6.8 % — AB (ref 4.8–5.6)
MEAN PLASMA GLUCOSE: 148.46 mg/dL

## 2017-02-23 LAB — GLUCOSE, CAPILLARY: GLUCOSE-CAPILLARY: 125 mg/dL — AB (ref 65–99)

## 2017-02-23 LAB — SURGICAL PCR SCREEN
MRSA, PCR: NEGATIVE
Staphylococcus aureus: NEGATIVE

## 2017-02-23 SURGERY — ANKLE FUSION
Anesthesia: General | Site: Ankle | Laterality: Right

## 2017-02-23 MED ORDER — OXYCODONE-ACETAMINOPHEN 5-325 MG PO TABS
ORAL_TABLET | ORAL | Status: AC
  Filled 2017-02-23: qty 1

## 2017-02-23 MED ORDER — TRANEXAMIC ACID 1000 MG/10ML IV SOLN
1000.0000 mg | INTRAVENOUS | Status: DC
  Filled 2017-02-23: qty 10

## 2017-02-23 MED ORDER — LIDOCAINE 2% (20 MG/ML) 5 ML SYRINGE
INTRAMUSCULAR | Status: DC | PRN
  Administered 2017-02-23: 100 mg via INTRAVENOUS

## 2017-02-23 MED ORDER — FENTANYL CITRATE (PF) 250 MCG/5ML IJ SOLN
INTRAMUSCULAR | Status: AC
  Filled 2017-02-23: qty 5

## 2017-02-23 MED ORDER — FENTANYL CITRATE (PF) 100 MCG/2ML IJ SOLN
25.0000 ug | INTRAMUSCULAR | Status: DC | PRN
  Administered 2017-02-23: 50 ug via INTRAVENOUS

## 2017-02-23 MED ORDER — EPHEDRINE SULFATE 50 MG/ML IJ SOLN
INTRAMUSCULAR | Status: DC | PRN
  Administered 2017-02-23: 10 mg via INTRAVENOUS
  Administered 2017-02-23 (×3): 5 mg via INTRAVENOUS

## 2017-02-23 MED ORDER — VANCOMYCIN HCL 1000 MG IV SOLR
INTRAVENOUS | Status: AC
  Filled 2017-02-23: qty 1000

## 2017-02-23 MED ORDER — VANCOMYCIN HCL 1000 MG IV SOLR
INTRAVENOUS | Status: DC | PRN
  Administered 2017-02-23: 1000 mg

## 2017-02-23 MED ORDER — GENTAMICIN SULFATE 40 MG/ML IJ SOLN
INTRAMUSCULAR | Status: DC | PRN
  Administered 2017-02-23: 240 mg

## 2017-02-23 MED ORDER — ACETAMINOPHEN 160 MG/5ML PO SOLN: 325.0000 mg | ORAL | Status: DC | PRN

## 2017-02-23 MED ORDER — MIDAZOLAM HCL 2 MG/2ML IJ SOLN
INTRAMUSCULAR | Status: AC
  Administered 2017-02-23: 2 mg via INTRAVENOUS
  Filled 2017-02-23: qty 2

## 2017-02-23 MED ORDER — LACTATED RINGERS IV SOLN
INTRAVENOUS | Status: DC
  Administered 2017-02-23 (×2): via INTRAVENOUS

## 2017-02-23 MED ORDER — FENTANYL CITRATE (PF) 100 MCG/2ML IJ SOLN
INTRAMUSCULAR | Status: DC | PRN
  Administered 2017-02-23: 50 ug via INTRAVENOUS

## 2017-02-23 MED ORDER — ONDANSETRON HCL 4 MG/2ML IJ SOLN
INTRAMUSCULAR | Status: DC | PRN
  Administered 2017-02-23: 4 mg via INTRAVENOUS

## 2017-02-23 MED ORDER — MUPIROCIN 2 % EX OINT
1.0000 "application " | TOPICAL_OINTMENT | Freq: Once | CUTANEOUS | Status: AC
  Administered 2017-02-23: 1 via TOPICAL

## 2017-02-23 MED ORDER — BUPIVACAINE HCL (PF) 0.5 % IJ SOLN
INTRAMUSCULAR | Status: DC | PRN
  Administered 2017-02-23: 10 mL

## 2017-02-23 MED ORDER — ONDANSETRON HCL 4 MG/2ML IJ SOLN
INTRAMUSCULAR | Status: AC
  Filled 2017-02-23: qty 2

## 2017-02-23 MED ORDER — BUPIVACAINE LIPOSOME 1.3 % IJ SUSP
INTRAMUSCULAR | Status: DC | PRN
  Administered 2017-02-23: 10 mL

## 2017-02-23 MED ORDER — BUPIVACAINE LIPOSOME 1.3 % IJ SUSP
20.0000 mL | INTRAMUSCULAR | Status: DC
  Filled 2017-02-23: qty 20

## 2017-02-23 MED ORDER — LIDOCAINE 2% (20 MG/ML) 5 ML SYRINGE
INTRAMUSCULAR | Status: AC
  Filled 2017-02-23: qty 5

## 2017-02-23 MED ORDER — OXYCODONE-ACETAMINOPHEN 5-325 MG PO TABS
1.0000 | ORAL_TABLET | Freq: Once | ORAL | Status: AC
  Administered 2017-02-23: 1 via ORAL

## 2017-02-23 MED ORDER — PHENYLEPHRINE HCL 10 MG/ML IJ SOLN
INTRAMUSCULAR | Status: DC | PRN
  Administered 2017-02-23 (×5): 80 ug via INTRAVENOUS

## 2017-02-23 MED ORDER — FENTANYL CITRATE (PF) 100 MCG/2ML IJ SOLN
INTRAMUSCULAR | Status: AC
  Filled 2017-02-23: qty 2

## 2017-02-23 MED ORDER — TRANEXAMIC ACID 1000 MG/10ML IV SOLN
2000.0000 mg | INTRAVENOUS | Status: DC
  Filled 2017-02-23: qty 20

## 2017-02-23 MED ORDER — ACETAMINOPHEN 325 MG PO TABS: 325.0000 mg | ORAL_TABLET | ORAL | Status: DC | PRN

## 2017-02-23 MED ORDER — MUPIROCIN 2 % EX OINT
TOPICAL_OINTMENT | CUTANEOUS | Status: AC
  Filled 2017-02-23: qty 22

## 2017-02-23 MED ORDER — OXYCODONE HCL 5 MG PO TABS: 5.0000 mg | ORAL_TABLET | Freq: Once | ORAL | Status: DC | PRN

## 2017-02-23 MED ORDER — ONDANSETRON HCL 4 MG/2ML IJ SOLN: 4.0000 mg | Freq: Once | INTRAMUSCULAR | Status: DC | PRN

## 2017-02-23 MED ORDER — CHLORHEXIDINE GLUCONATE 4 % EX LIQD: 60.0000 mL | Freq: Once | CUTANEOUS | Status: DC

## 2017-02-23 MED ORDER — PROPOFOL 10 MG/ML IV BOLUS
INTRAVENOUS | Status: DC | PRN
  Administered 2017-02-23: 200 mg via INTRAVENOUS

## 2017-02-23 MED ORDER — MIDAZOLAM HCL 2 MG/2ML IJ SOLN
INTRAMUSCULAR | Status: AC
  Filled 2017-02-23: qty 2

## 2017-02-23 MED ORDER — PHENYLEPHRINE 40 MCG/ML (10ML) SYRINGE FOR IV PUSH (FOR BLOOD PRESSURE SUPPORT)
PREFILLED_SYRINGE | INTRAVENOUS | Status: AC
  Filled 2017-02-23: qty 10

## 2017-02-23 MED ORDER — MEPERIDINE HCL 50 MG/ML IJ SOLN: 6.2500 mg | INTRAMUSCULAR | Status: DC | PRN

## 2017-02-23 MED ORDER — OXYCODONE HCL 5 MG/5ML PO SOLN: 5.0000 mg | Freq: Once | ORAL | Status: DC | PRN

## 2017-02-23 MED ORDER — PROPOFOL 10 MG/ML IV BOLUS
INTRAVENOUS | Status: AC
  Filled 2017-02-23: qty 20

## 2017-02-23 MED ORDER — FENTANYL CITRATE (PF) 100 MCG/2ML IJ SOLN
100.0000 ug | Freq: Once | INTRAMUSCULAR | Status: AC
  Administered 2017-02-23: 100 ug via INTRAVENOUS

## 2017-02-23 MED ORDER — MIDAZOLAM HCL 2 MG/2ML IJ SOLN
2.0000 mg | Freq: Once | INTRAMUSCULAR | Status: AC
  Administered 2017-02-23: 2 mg via INTRAVENOUS

## 2017-02-23 MED ORDER — 0.9 % SODIUM CHLORIDE (POUR BTL) OPTIME
TOPICAL | Status: DC | PRN
  Administered 2017-02-23: 1000 mL

## 2017-02-23 MED ORDER — OXYCODONE-ACETAMINOPHEN 5-325 MG PO TABS: 1.0000 | ORAL_TABLET | ORAL | 0 refills | Status: DC | PRN

## 2017-02-23 MED ORDER — GENTAMICIN SULFATE 40 MG/ML IJ SOLN
INTRAMUSCULAR | Status: AC
  Filled 2017-02-23: qty 6

## 2017-02-23 MED ORDER — FENTANYL CITRATE (PF) 100 MCG/2ML IJ SOLN
INTRAMUSCULAR | Status: AC
  Administered 2017-02-23: 100 ug via INTRAVENOUS
  Filled 2017-02-23: qty 2

## 2017-02-23 MED ORDER — KETOROLAC TROMETHAMINE 30 MG/ML IJ SOLN: 30.0000 mg | Freq: Once | INTRAMUSCULAR | Status: DC | PRN

## 2017-02-23 MED ORDER — DEXMEDETOMIDINE HCL IN NACL 80 MCG/20ML IV SOLN
4.0000 ug | INTRAVENOUS | Status: DC
  Filled 2017-02-23: qty 20

## 2017-02-23 SURGICAL SUPPLY — 49 items
BANDAGE ESMARK 6X9 LF (GAUZE/BANDAGES/DRESSINGS) ×1 IMPLANT
BIT DRILL Q/COUPLING 1 (BIT) ×2 IMPLANT
BLADE SAW SGTL HD 18.5X60.5X1. (BLADE) ×2 IMPLANT
BLADE SURG 10 STRL SS (BLADE) IMPLANT
BNDG COHESIVE 4X5 TAN STRL (GAUZE/BANDAGES/DRESSINGS) ×2 IMPLANT
BNDG ESMARK 6X9 LF (GAUZE/BANDAGES/DRESSINGS) ×2
BNDG GAUZE ELAST 4 BULKY (GAUZE/BANDAGES/DRESSINGS) ×4 IMPLANT
BUR ROUND FLUTED 4 SOFT TCH (BURR) ×2 IMPLANT
COVER MAYO STAND STRL (DRAPES) IMPLANT
COVER SURGICAL LIGHT HANDLE (MISCELLANEOUS) ×4 IMPLANT
DRAPE OEC MINIVIEW 54X84 (DRAPES) ×2 IMPLANT
DRAPE U-SHAPE 47X51 STRL (DRAPES) ×2 IMPLANT
DRESSING PREVENA PLUS CUSTOM (GAUZE/BANDAGES/DRESSINGS) ×2 IMPLANT
DRSG ADAPTIC 3X8 NADH LF (GAUZE/BANDAGES/DRESSINGS) ×2 IMPLANT
DRSG PREVENA PLUS CUSTOM (GAUZE/BANDAGES/DRESSINGS) ×4
DURAPREP 26ML APPLICATOR (WOUND CARE) ×2 IMPLANT
ELECT REM PT RETURN 9FT ADLT (ELECTROSURGICAL) ×2
ELECTRODE REM PT RTRN 9FT ADLT (ELECTROSURGICAL) ×1 IMPLANT
GAUZE SPONGE 4X4 12PLY STRL (GAUZE/BANDAGES/DRESSINGS) ×2 IMPLANT
GLOVE BIOGEL PI IND STRL 9 (GLOVE) ×1 IMPLANT
GLOVE BIOGEL PI INDICATOR 9 (GLOVE) ×1
GLOVE SURG ORTHO 9.0 STRL STRW (GLOVE) ×2 IMPLANT
GOWN STRL REUS W/ TWL LRG LVL3 (GOWN DISPOSABLE) ×1 IMPLANT
GOWN STRL REUS W/ TWL XL LVL3 (GOWN DISPOSABLE) ×1 IMPLANT
GOWN STRL REUS W/TWL LRG LVL3 (GOWN DISPOSABLE) ×1
GOWN STRL REUS W/TWL XL LVL3 (GOWN DISPOSABLE) ×1
KIT BASIN OR (CUSTOM PROCEDURE TRAY) ×2 IMPLANT
KIT ROOM TURNOVER OR (KITS) ×2 IMPLANT
KIT STIMULAN RAPID CURE  10CC (Orthopedic Implant) ×1 IMPLANT
KIT STIMULAN RAPID CURE 10CC (Orthopedic Implant) ×1 IMPLANT
NS IRRIG 1000ML POUR BTL (IV SOLUTION) ×2 IMPLANT
PACK ORTHO EXTREMITY (CUSTOM PROCEDURE TRAY) ×2 IMPLANT
PAD ARMBOARD 7.5X6 YLW CONV (MISCELLANEOUS) ×4 IMPLANT
PLATE ANTERIOR ANKLE FUSION 6H (Plate) ×2 IMPLANT
SCREW CORT ST 4.5X50 (Screw) ×2 IMPLANT
SCREW CORTEX ST 4.5X28 (Screw) ×2 IMPLANT
SCREW CORTEX ST 4.5X36 (Screw) ×6 IMPLANT
SCREW CORTEX ST 4.5X38 (Screw) ×2 IMPLANT
SPONGE LAP 18X18 X RAY DECT (DISPOSABLE) ×4 IMPLANT
STRIP ILIUM TRICORT 2.2CMX60MM (Neuro Prosthesis/Implant) ×2 IMPLANT
SUCTION FRAZIER HANDLE 10FR (MISCELLANEOUS) ×1
SUCTION TUBE FRAZIER 10FR DISP (MISCELLANEOUS) ×1 IMPLANT
SUT ETHILON 2 0 PSLX (SUTURE) ×4 IMPLANT
TOWEL OR 17X24 6PK STRL BLUE (TOWEL DISPOSABLE) ×2 IMPLANT
TOWEL OR 17X26 10 PK STRL BLUE (TOWEL DISPOSABLE) ×2 IMPLANT
TUBE CONNECTING 12X1/4 (SUCTIONS) ×2 IMPLANT
TUBE CONNECTING 20X1/4 (TUBING) ×2 IMPLANT
WATER STERILE IRR 1000ML POUR (IV SOLUTION) ×2 IMPLANT
YANKAUER SUCT BULB TIP NO VENT (SUCTIONS) ×2 IMPLANT

## 2017-02-23 NOTE — Transfer of Care (Signed)
Immediate Anesthesia Transfer of Care Note  Patient: Scott Hebert  Procedure(s) Performed: REVISION RIGHT ANKLE FUSION WITH STIMULAN ANTIBIOTIC BEADS (Right Ankle)  Patient Location: PACU  Anesthesia Type:General  Level of Consciousness: awake, alert  and oriented  Airway & Oxygen Therapy: Patient Spontanous Breathing  Post-op Assessment: Post -op Vital signs reviewed and stable  Post vital signs: stable  Last Vitals:  Vitals:   02/23/17 1005 02/23/17 1010  BP:  (!) 137/102  Pulse: 79 85  Resp: 16 19  Temp:    SpO2: 95% 100%    Last Pain:  Vitals:   02/23/17 0830  TempSrc:   PainSc: 7       Patients Stated Pain Goal: 2 (95/74/73 4037)  Complications: No apparent anesthesia complications

## 2017-02-23 NOTE — Anesthesia Postprocedure Evaluation (Signed)
Anesthesia Post Note  Patient: Scott Hebert  Procedure(s) Performed: REVISION RIGHT ANKLE FUSION WITH STIMULAN ANTIBIOTIC BEADS (Right Ankle)     Patient location during evaluation: PACU Anesthesia Type: General Level of consciousness: awake and alert Pain management: pain level controlled Vital Signs Assessment: post-procedure vital signs reviewed and stable Respiratory status: spontaneous breathing, nonlabored ventilation, respiratory function stable and patient connected to nasal cannula oxygen Cardiovascular status: blood pressure returned to baseline and stable Postop Assessment: no apparent nausea or vomiting Anesthetic complications: no    Last Vitals:  Vitals:   02/23/17 1309 02/23/17 1332  BP: (!) 150/88 (!) 133/96  Pulse: 86 85  Resp: 14 16  Temp: 36.6 C   SpO2: 97% 96%    Last Pain:  Vitals:   02/23/17 1332  TempSrc:   PainSc: 0-No pain                 Bernhardt Riemenschneider

## 2017-02-23 NOTE — H&P (Signed)
Scott Hebert is an 57 y.o. male.   Chief Complaint: Fibrous nonunion right ankle fusion. HPI: Patient is a 57 year old gentleman who is status post right ankle fusion.  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  Due to pain with activities of daily living patient would like to proceed with revision fusion.  Past Medical History:  Diagnosis Date  . Allergy   . Arthritis    "right ankle" (05/26/2016)  . Depression   . 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 GERD (gastroesophageal reflux disease)   . Headache    migraines  . Infection    RLE; MRSA  . Pre-diabetes     Past Surgical History:  Procedure Laterality Date  . ANKLE ARTHROSCOPY Right 05/26/2016  . ANKLE ARTHROSCOPY Right 05/26/2016   Procedure: Irrigation and Debridement Right Fibula and Arthroscopic Debridement Right Ankle, Apply Wound VAC;  Surgeon: Newt Minion, MD;  Location: [facility];  Service: Orthopedics;  Laterality: Right;  . ANKLE FUSION Right 05/31/2016   Procedure: IRRIGATION AND DEBRIDEMENT RIGHT ANKLE WITH FUSION;  Surgeon: Newt Minion, MD;  Location: [facility];  Service: Orthopedics;  Laterality: Right;  . APPLICATION OF WOUND VAC  05/26/2016  . FRACTURE SURGERY    . HARDWARE REMOVAL Right 03/19/2016   Procedure: HARDWARE REMOVAL RIGHT ANKLE;  Surgeon: Newt Minion, MD;  Location: [facility];  Service: Orthopedics;  Laterality: Right;  . ORIF ANKLE FRACTURE Right 01/18/2016   Procedure: OPEN REDUCTION INTERNAL FIXATION (ORIF) ANKLE FRACTURE;  Surgeon: Newt Minion, MD;  Location: [facility];  Service: Orthopedics;  Laterality: Right;  . TIBIA DEBRIDEMENT Right 05/26/2016    Family History  Problem Relation Age of Onset  . Cancer Mother    Social History:  reports that he has been smoking cigarettes.  He has a 7.00 pack-year smoking history. he has never used smokeless tobacco. He reports that he drinks alcohol. He reports that he does not use drugs.  Allergies:  Allergies  Allergen Reactions  .  Shellfish-Derived Products Anaphylaxis  . Other Hives    Honeydew Melon    No medications prior to admission.    No results found for this or any previous visit (from the past 48 hour(s)). No results found.  Review of Systems  All other systems reviewed and are negative. Examination patient is alert oriented no adenopathy well-dressed normal affect normal respiratory effort he has an antalgic gait.  He is tenderness to palpation over the ankle fusion site.  Radiographs shows a fibrous nonunion.  There were no vitals taken for this visit. Physical Exam   Assessment/Plan Assessment: Fibrous nonunion right ankle fusion.  Plan: We will plan for anterior approach with removal of the deep retained hardware and revision fusion of the ankle.  Risk and benefits were discussed including recurrent infection neurovascular injury nonunion and need for additional surgery.  Patient states he understands wished to proceed at this time.  Newt Minion, MD 02/23/2017, 6:49 AM

## 2017-02-23 NOTE — Anesthesia Procedure Notes (Signed)
Anesthesia Regional Block: Popliteal block   Pre-Anesthetic Checklist: ,, timeout performed, Correct Patient, Correct Site, Correct Laterality, Correct Procedure, Correct Position, site marked, Risks and benefits discussed,  Surgical consent,  Pre-op evaluation,  At surgeon's request and post-op pain management  Laterality: Right  Prep: chloraprep       Needles:  Injection technique: Single-shot  Needle Type: Echogenic Stimulator Needle     Needle Length: 5cm  Needle Gauge: 22     Additional Needles:   Procedures:, nerve stimulator,,, ultrasound used (permanent image in chart),,,,  Narrative:  Start time: 02/23/2017 10:08 AM End time: 02/23/2017 10:13 AM Injection made incrementally with aspirations every 5 mL.  Performed by: Personally  Anesthesiologist: Janeece Riggers, MD  Additional Notes: Functioning IV was confirmed and monitors were applied.  A 83mm 22ga Arrow echogenic stimulator needle was used. Sterile prep and drape,hand hygiene and sterile gloves were used. Ultrasound guidance: relevant anatomy identified, needle position confirmed, local anesthetic spread visualized around nerve(s)., vascular puncture avoided.  Image printed for medical record. Negative aspiration and negative test dose prior to incremental administration of local anesthetic. The patient tolerated the procedure well.

## 2017-02-23 NOTE — Op Note (Signed)
02/23/2017  12:07 PM  PATIENT:  Scott Hebert    PRE-OPERATIVE DIAGNOSIS:  Fibrous Union Right Ankle  POST-OPERATIVE DIAGNOSIS:  Same  PROCEDURE:  REVISION RIGHT ANKLE FUSION WITH STIMULAN ANTIBIOTIC BEADS, tricortical allograft bone graft, placement of Praveena wound VAC. Removal of deep retained hardware.  SURGEON:  Newt Minion, MD  PHYSICIAN ASSISTANT:None ANESTHESIA:   General  PREOPERATIVE INDICATIONS:  [name] is a  57 y.o. male with a diagnosis of Fibrous Union Right Ankle who failed conservative measures and elected for surgical management.    The risks benefits and alternatives were discussed with the patient preoperatively including but not limited to the risks of infection, bleeding, nerve injury, cardiopulmonary complications, the need for revision surgery, among others, and the patient was willing to proceed.  OPERATIVE IMPLANTS: Anterior Synthes compression plate, stimulant antibiotic beads 10 cc with 1 g vancomycin and 240 mg gentamicin.  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  Patient was brought the operating room after undergoing a popliteal block he then underwent a general anesthetic.  After adequate levels of anesthesia were obtained patient's right lower extremity was prepped using DuraPrep draped into a sterile field a timeout was called.  OPERATIVE PROCEDURE: A dorsal incision was made over the ankle.  This was carried down through the skin of the interval for the EHL and the common extensor tendon.  Blunt dissection was carried down to the superficial peroneal nerve this was protected and retracted laterally.  Blunt dissection was carried down to the joint the neurovascular bundle was retracted medially.  Using subperiosteal dissection the joint was identified Hohmann retractors were placed medially and laterally and the bone was debrided of fibrinous tissue as well as hard cortical avascular necrosis of the bone.   A bur osteotome and rondure were used for further debridement.  The deep retained screws were removed without complications.  The bone was further debrided back with curette osteotome and bur back to bleeding viable bone.  Wound was irrigated with normal saline.  Tricortical strut grafts x2 were cut to maintain the joint space.  The wound was packed with stimulant antibiotic beads with 1 g vancomycin and 240 mg gentamicin.  There was no fluid in the joint there is no clinical signs of infection.  The joint was then compressed aligned and the anterior plate was applied with 2 screws into the talus and 2 screws proximally.  C-arm fluoroscopy verified alignment.  The foot was at 90 degrees and plantigrade.  The wound was irrigated with normal saline throughout the case.  The skin was closed using 2-0 nylon.  A Praveena wound VAC was applied this had a good suction fit patient was extubated taken the PACU in stable condition.   DISCHARGE PLANNING:  Antibiotic duration: Perioperative antibiotics plus antibiotic beads.    Weightbearing: Nonweightbearing right lower extremity   Pain medication: Percocet No. 60  Dressing care/ Wound VAC: Continue wound VAC for 1 week.  Ambulatory devices: Crutches  Discharge to: home  Follow-up: In the office 1 week post operative.

## 2017-02-23 NOTE — Anesthesia Procedure Notes (Signed)
Procedure Name: LMA Insertion Date/Time: 02/23/2017 10:36 AM Performed by: Babs Bertin, CRNA Pre-anesthesia Checklist: Patient identified, Emergency Drugs available, Suction available and Patient being monitored Patient Re-evaluated:Patient Re-evaluated prior to induction Oxygen Delivery Method: Circle System Utilized Preoxygenation: Pre-oxygenation with 100% oxygen Induction Type: IV induction Ventilation: Mask ventilation without difficulty LMA: LMA inserted LMA Size: 5.0 Number of attempts: 1 Airway Equipment and Method: Bite block Placement Confirmation: positive ETCO2 Tube secured with: Tape Dental Injury: Teeth and Oropharynx as per pre-operative assessment

## 2017-02-24 ENCOUNTER — Encounter (INDEPENDENT_AMBULATORY_CARE_PROVIDER_SITE_OTHER): Payer: Self-pay | Admitting: Family

## 2017-02-24 NOTE — Progress Notes (Signed)
Post-Op Visit Note   Patient: Scott Hebert           Date of Birth: 06-20-1960           MRN: [id number] Visit Date: 02/23/2017 PCP: Patient, [name]  Chief Complaint: No chief complaint on file.   HPI:  HPI Patient is a 57 year old gentleman seen today day several hours post op for concern of Prevena wound vac malfunctioning. Leaking from wound vac dressing and build up of blood under vac dressing. Paige from [facility] accompanies.  Ortho Exam Wound vac dressing in place. Swelling and tenderness to foot and ankle. No erythema.   Visit Diagnoses:  1. H/O ankle fusion   2. MRSA infection     Plan: attempted to reinforce dressing. prevena and sponge and dressing ultimately all replaced in office. Patient discharged with vac in working order. To follow up in office in 1 week for vac removal. Will call with any concerns in interim.   Follow-Up Instructions: No Follow-up on file.   Imaging: No results found.  Orders:  No orders of the defined types were placed in this encounter.  No orders of the defined types were placed in this encounter.    PMFS History: Patient Active Problem List   Diagnosis Date Noted  . Complex regional pain syndrome type 1 of right lower extremity 10/26/2016  . Foot drop, right foot 09/27/2016  . H/O ankle fusion 06/09/2016  . Acute hematogenous osteomyelitis of right fibula ([place])   . MRSA infection   . Therapeutic drug monitoring   . Phlebitis after infusion   . Septic arthritis of right ankle ([place]) 05/26/2016  . 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 Closed extra-articular fracture of distal end of right tibia   . Closed fracture of distal end of fibula, unspecified fracture morphology, initial encounter 01/17/2016  . Allergy 12/07/2011   Past Medical History:  Diagnosis Date  . Allergy   . Arthritis    "right ankle" (05/26/2016)  . Depression   . 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 GERD  (gastroesophageal reflux disease)   . Headache    migraines  . Infection    RLE; MRSA  . Pre-diabetes     Family History  Problem Relation Age of Onset  . Cancer Mother     Past Surgical History:  Procedure Laterality Date  . ANKLE ARTHROSCOPY Right 05/26/2016  . ANKLE ARTHROSCOPY Right 05/26/2016   Procedure: Irrigation and Debridement Right Fibula and Arthroscopic Debridement Right Ankle, Apply Wound VAC;  Surgeon: Newt Minion, MD;  Location: [facility];  Service: Orthopedics;  Laterality: Right;  . ANKLE FUSION Right 05/31/2016   Procedure: IRRIGATION AND DEBRIDEMENT RIGHT ANKLE WITH FUSION;  Surgeon: Newt Minion, MD;  Location: [facility];  Service: Orthopedics;  Laterality: Right;  . APPLICATION OF WOUND VAC  05/26/2016  . FRACTURE SURGERY    . HARDWARE REMOVAL Right 03/19/2016   Procedure: HARDWARE REMOVAL RIGHT ANKLE;  Surgeon: Newt Minion, MD;  Location: [facility];  Service: Orthopedics;  Laterality: Right;  . ORIF ANKLE FRACTURE Right 01/18/2016   Procedure: OPEN REDUCTION INTERNAL FIXATION (ORIF) ANKLE FRACTURE;  Surgeon: Newt Minion, MD;  Location: [facility];  Service: Orthopedics;  Laterality: Right;  . TIBIA DEBRIDEMENT Right 05/26/2016   Social History   Occupational History  . Occupation: Psychologist, occupational: [employer]  Tobacco Use  . Smoking status:  Current Every Day Smoker    Packs/day: 0.50    Years: 14.00    Pack years: 7.00    Types: Cigarettes  . Smokeless tobacco: Never Used  Substance and Sexual Activity  . Alcohol use: Yes    Comment: social  . Drug use: No  . Sexual activity: Yes    Partners: Female    Birth control/protection: None

## 2017-02-25 ENCOUNTER — Ambulatory Visit (INDEPENDENT_AMBULATORY_CARE_PROVIDER_SITE_OTHER): Admitting: Family

## 2017-02-25 ENCOUNTER — Emergency Department (HOSPITAL_COMMUNITY)
Admission: EM | Admit: 2017-02-25 | Discharge: 2017-02-25 | Disposition: A | Discharge status: Home / Self Care | Attending: Emergency Medicine | Admitting: Emergency Medicine

## 2017-02-25 ENCOUNTER — Encounter (INDEPENDENT_AMBULATORY_CARE_PROVIDER_SITE_OTHER): Payer: Self-pay | Admitting: Family

## 2017-02-25 ENCOUNTER — Other Ambulatory Visit: Payer: Self-pay

## 2017-02-25 ENCOUNTER — Encounter (HOSPITAL_COMMUNITY): Payer: Self-pay | Admitting: Emergency Medicine

## 2017-02-25 ENCOUNTER — Telehealth (INDEPENDENT_AMBULATORY_CARE_PROVIDER_SITE_OTHER): Payer: Self-pay | Admitting: Radiology

## 2017-02-25 VITALS — Ht 69.0 in | Wt 246.0 lb

## 2017-02-25 DIAGNOSIS — Z981 Arthrodesis status: Secondary | ICD-10-CM

## 2017-02-25 DIAGNOSIS — Z7982 Long term (current) use of aspirin: Secondary | ICD-10-CM | POA: Insufficient documentation

## 2017-02-25 DIAGNOSIS — S81801D Unspecified open wound, right lower leg, subsequent encounter: Secondary | ICD-10-CM | POA: Insufficient documentation

## 2017-02-25 DIAGNOSIS — F17210 Nicotine dependence, cigarettes, uncomplicated: Secondary | ICD-10-CM | POA: Insufficient documentation

## 2017-02-25 DIAGNOSIS — Y33XXXD Other specified events, undetermined intent, subsequent encounter: Secondary | ICD-10-CM | POA: Insufficient documentation

## 2017-02-25 DIAGNOSIS — S91309A Unspecified open wound, unspecified foot, initial encounter: Secondary | ICD-10-CM

## 2017-02-25 DIAGNOSIS — Z79899 Other long term (current) drug therapy: Secondary | ICD-10-CM | POA: Diagnosis not present

## 2017-02-25 NOTE — Telephone Encounter (Signed)
Patient LMVM Triage that his wound vac has stopped working, and it's backing up in the tube.  He actually went to the ED yesterday and was told they could not do anything and he would have to wait to ask [place] once we open up today.  Please call him.

## 2017-02-25 NOTE — ED Triage Notes (Signed)
Patient here after wound vac malfunctioning.  Patient seen in [place] orthopedic office today after 1st wound vac malfunctioning.  Patient states that the new wound vac was working well and then it just stopped working.  Dr Sharol Given placed wound vac after right ankle fusion.

## 2017-02-25 NOTE — Discharge Instructions (Signed)
Please follow up ASAP with Dr. Sharol Given

## 2017-02-25 NOTE — Telephone Encounter (Signed)
Pt coming into the office now for OV to d/c vac

## 2017-02-25 NOTE — Telephone Encounter (Signed)
I called pt no answer and mailbox full. Will hold and try again.

## 2017-02-25 NOTE — Progress Notes (Signed)
Post-Op Visit Note   Patient: Scott Hebert           Date of Birth: 03-25-1960           MRN: [id number] Visit Date: 02/25/2017 PCP: Patient, [name]  Chief Complaint:  Chief Complaint  Patient presents with  . Right Ankle - Wound Check    02/23/17 revision ankle fusion with prevena vac    HPI:  HPI Patient is a 57 year old gentleman seen today day for concern of Prevena wound vac malfunctioning. Revision ankle fusion on 02/23/17.  Ortho Exam Vac dressing removed. Incision well approximated with sutures. Moderate serosanguinous drainage. No erythema. Swelling and tenderness to foot and ankle.   Visit Diagnoses:  1. H/O ankle fusion     Plan: Dry dressing applied. Patient to begin daily dial soap cleansing and dry dressings. Follow up in office in 2 weeks with radiographs and to evaluate for suture removal.  Follow-Up Instructions: Return in about 2 weeks (around 03/11/2017).   Imaging: No results found.  Orders:  No orders of the defined types were placed in this encounter.  No orders of the defined types were placed in this encounter.    PMFS History: Patient Active Problem List   Diagnosis Date Noted  . Complex regional pain syndrome type 1 of right lower extremity 10/26/2016  . Foot drop, right foot 09/27/2016  . H/O ankle fusion 06/09/2016  . Acute hematogenous osteomyelitis of right fibula ([place])   . MRSA infection   . Therapeutic drug monitoring   . Phlebitis after infusion   . Septic arthritis of right ankle ([place]) 05/26/2016  . 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 Closed extra-articular fracture of distal end of right tibia   . Closed fracture of distal end of fibula, unspecified fracture morphology, initial encounter 01/17/2016  . Allergy 12/07/2011   Past Medical History:  Diagnosis Date  . Allergy   . Arthritis    "right ankle" (05/26/2016)  . Depression   . 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 GERD  (gastroesophageal reflux disease)   . Headache    migraines  . Infection    RLE; MRSA  . Pre-diabetes     Family History  Problem Relation Age of Onset  . Cancer Mother     Past Surgical History:  Procedure Laterality Date  . ANKLE ARTHROSCOPY Right 05/26/2016  . ANKLE ARTHROSCOPY Right 05/26/2016   Procedure: Irrigation and Debridement Right Fibula and Arthroscopic Debridement Right Ankle, Apply Wound VAC;  Surgeon: Newt Minion, MD;  Location: [facility];  Service: Orthopedics;  Laterality: Right;  . ANKLE FUSION Right 05/31/2016   Procedure: IRRIGATION AND DEBRIDEMENT RIGHT ANKLE WITH FUSION;  Surgeon: Newt Minion, MD;  Location: [facility];  Service: Orthopedics;  Laterality: Right;  . ANKLE FUSION Right 02/23/2017   Procedure: REVISION RIGHT ANKLE FUSION WITH STIMULAN ANTIBIOTIC BEADS;  Surgeon: Newt Minion, MD;  Location: [facility];  Service: Orthopedics;  Laterality: Right;  . APPLICATION OF WOUND VAC  05/26/2016  . FRACTURE SURGERY    . HARDWARE REMOVAL Right 03/19/2016   Procedure: HARDWARE REMOVAL RIGHT ANKLE;  Surgeon: Newt Minion, MD;  Location: [facility];  Service: Orthopedics;  Laterality: Right;  . ORIF ANKLE FRACTURE Right 01/18/2016   Procedure: OPEN REDUCTION INTERNAL FIXATION (ORIF) ANKLE FRACTURE;  Surgeon: Newt Minion, MD;  Location: [facility];  Service: Orthopedics;  Laterality: Right;  . TIBIA  DEBRIDEMENT Right 05/26/2016   Social History   Occupational History  . Occupation: Psychologist, occupational: [employer]  Tobacco Use  . Smoking status: Current Every Day Smoker    Packs/day: 0.50    Years: 14.00    Pack years: 7.00    Types: Cigarettes  . Smokeless tobacco: Never Used  Substance and Sexual Activity  . Alcohol use: Yes    Comment: social  . Drug use: No  . Sexual activity: Yes    Partners: Female    Birth control/protection: None

## 2017-02-25 NOTE — ED Provider Notes (Signed)
[facility] EMERGENCY DEPARTMENT Provider Note   CSN: [id number] Arrival date & time: 02/25/17  0017     History   Chief Complaint Chief Complaint  Patient presents with  . Medical equipment failure    HPI Scott Hebert is a 57 y.o. male who presents the emergency department with chief complaint of wound VAC malfunction.  Patient is currently undergoing wound healing of his right lower extremity under the care of Dr. Sharol Given.  He has a wound VAC on and it stopped suctioning tonight.  Patient came to the emergency department.  He has no other complaints at this time.  HPI  Past Medical History:  Diagnosis Date  . Allergy   . Arthritis    "right ankle" (05/26/2016)  . Depression   . 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 GERD (gastroesophageal reflux disease)   . Headache    migraines  . Infection    RLE; MRSA  . Pre-diabetes     Patient Active Problem List   Diagnosis Date Noted  . Complex regional pain syndrome type 1 of right lower extremity 10/26/2016  . Foot drop, right foot 09/27/2016  . H/O ankle fusion 06/09/2016  . Acute hematogenous osteomyelitis of right fibula ([place])   . MRSA infection   . Therapeutic drug monitoring   . Phlebitis after infusion   . Septic arthritis of right ankle ([place]) 05/26/2016  . 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 Closed extra-articular fracture of distal end of right tibia   . Closed fracture of distal end of fibula, unspecified fracture morphology, initial encounter 01/17/2016  . Allergy 12/07/2011    Past Surgical History:  Procedure Laterality Date  . ANKLE ARTHROSCOPY Right 05/26/2016  . ANKLE ARTHROSCOPY Right 05/26/2016   Procedure: Irrigation and Debridement Right Fibula and Arthroscopic Debridement Right Ankle, Apply Wound VAC;  Surgeon: Newt Minion, MD;  Location: [facility];  Service: Orthopedics;  Laterality: Right;  . ANKLE FUSION Right 05/31/2016   Procedure: IRRIGATION  AND DEBRIDEMENT RIGHT ANKLE WITH FUSION;  Surgeon: Newt Minion, MD;  Location: [facility];  Service: Orthopedics;  Laterality: Right;  . ANKLE FUSION Right 02/23/2017   Procedure: REVISION RIGHT ANKLE FUSION WITH STIMULAN ANTIBIOTIC BEADS;  Surgeon: Newt Minion, MD;  Location: [facility];  Service: Orthopedics;  Laterality: Right;  . APPLICATION OF WOUND VAC  05/26/2016  . FRACTURE SURGERY    . HARDWARE REMOVAL Right 03/19/2016   Procedure: HARDWARE REMOVAL RIGHT ANKLE;  Surgeon: Newt Minion, MD;  Location: [facility];  Service: Orthopedics;  Laterality: Right;  . ORIF ANKLE FRACTURE Right 01/18/2016   Procedure: OPEN REDUCTION INTERNAL FIXATION (ORIF) ANKLE FRACTURE;  Surgeon: Newt Minion, MD;  Location: [facility];  Service: Orthopedics;  Laterality: Right;  . TIBIA DEBRIDEMENT Right 05/26/2016       Home Medications    Prior to Admission medications   Medication Sig Start Date End Date Taking? Authorizing Provider  aspirin EC 325 MG tablet Take 1 tablet (325 mg total) by mouth daily. Patient taking differently: Take 325 mg by mouth daily after breakfast.  01/18/16   Newt Minion, MD  Cholecalciferol (VITAMIN D3) 50000 units CAPS Take 50,000 Units by mouth every Sunday.    [provider]  diclofenac sodium (VOLTAREN) 1 % GEL Apply 2 g topically 4 (four) times daily. Patient taking differently: Apply 1 g topically 4 (four) times daily as needed (for pain.).  07/12/16   Carlyle Basques, MD  doxycycline (VIBRA-TABS) 100 MG tablet Take 1 tablet (100 mg total) by mouth 2 (two) times daily. 09/20/16   Carlyle Basques, MD  gabapentin (NEURONTIN) 300 MG capsule Take 1 capsule (300 mg total) by mouth 3 (three) times daily. Start only to take at bedtime x 7 days Patient taking differently: Take 300 mg by mouth daily as needed (FOR PAIN.).  10/18/16   Carlyle Basques, MD  oxyCODONE-acetaminophen (PERCOCET/ROXICET) 5-325 MG tablet Take 1 tablet by mouth every 8 (eight) hours as needed for severe pain. 02/17/17    Suzan Slick, NP  oxyCODONE-acetaminophen (PERCOCET/ROXICET) 5-325 MG tablet Take 1 tablet by mouth every 4 (four) hours as needed for severe pain. 02/23/17   Newt Minion, MD  Tetrahydrozoline-Zn Sulfate (VISINE-AC OP) Place 1-2 drops into both eyes 3 (three) times daily as needed (for dry eyes/allergy eyes.).    [provider]  traZODone (DESYREL) 50 MG tablet Take 1 tablet (50 mg total) by mouth at bedtime as needed for sleep. Start taking 1/2 tab at bedtime i Patient taking differently: Take 50 mg by mouth at bedtime as needed for sleep.  08/12/16   Carlyle Basques, MD    Family History Family History  Problem Relation Age of Onset  . Cancer Mother     Social History Social History   Tobacco Use  . Smoking status: Current Every Day Smoker    Packs/day: 0.50    Years: 14.00    Pack years: 7.00    Types: Cigarettes  . Smokeless tobacco: Never Used  Substance Use Topics  . Alcohol use: Yes    Comment: social  . Drug use: No     Allergies   Shellfish-derived products and Other   Review of Systems Review of Systems  Ten systems reviewed and are negative for acute change, except as noted in the HPI.   Physical Exam Updated Vital Signs BP 133/89 (BP Location: Right Arm)   Pulse 92   Temp 98.5 F (36.9 C) (Oral)   Resp 20   SpO2 97%   Physical Exam Physical Exam  Nursing note and vitals reviewed. Constitutional: He appears well-developed and well-nourished. No distress.  HENT:  Head: Normocephalic and atraumatic.  Eyes: Conjunctivae normal are normal. No scleral icterus.  Neck: Normal range of motion. Neck supple.  Cardiovascular: Normal rate, regular rhythm and normal heart sounds.   Pulmonary/Chest: Effort normal and breath sounds normal. No respiratory distress.  Abdominal: Soft. There is no tenderness.  Musculoskeletal: He exhibits no edema. Right foot is currently wrapped in dressing.  There is a tube running from the right foot up toward a  wound VAC.  Blood is in the line without movement. Neurological: He is alert.  Skin: Skin is warm and dry. He is not diaphoretic.  Psychiatric: His behavior is normal.     ED Treatments / Results  Labs (all labs ordered are listed, but only abnormal results are displayed) Labs Reviewed - No data to display  EKG  EKG Interpretation None       Radiology No results found.  Procedures Procedures (including critical care time)  Medications Ordered in ED Medications - No data to display   Initial Impression / Assessment and Plan / ED Course  I have reviewed the triage vital signs and the nursing notes.  Pertinent labs & imaging results that were available during my care of the patient were reviewed by me and considered in my medical decision making (  see chart for details).     With equipment malfunction.  Unable to provide help with this at 3:00 in the morning.  I have inbox Dr. Sharol Given and advised the patient to call his office first thing in the morning.  He appears appropriate for discharge at this time  Final Clinical Impressions(s) / ED Diagnoses   Final diagnoses:  None    ED Discharge Orders    None       Margarita Mail, PA-C 02/25/17 [place], April, MD 02/25/17 [phone]

## 2017-03-02 ENCOUNTER — Encounter (INDEPENDENT_AMBULATORY_CARE_PROVIDER_SITE_OTHER): Payer: Self-pay | Admitting: Orthopedic Surgery

## 2017-03-02 ENCOUNTER — Ambulatory Visit (INDEPENDENT_AMBULATORY_CARE_PROVIDER_SITE_OTHER): Admitting: Orthopedic Surgery

## 2017-03-02 VITALS — Ht 69.0 in | Wt 246.0 lb

## 2017-03-02 DIAGNOSIS — Z981 Arthrodesis status: Secondary | ICD-10-CM

## 2017-03-02 NOTE — Progress Notes (Signed)
Post-Op Visit Note   Patient: Scott Hebert           Date of Birth: 07-04-1960           MRN: [id number] Visit Date: 03/02/2017 PCP: Patient, [name]  Chief Complaint:  Chief Complaint  Patient presents with  . Right Ankle - Routine Post Op    02/23/17 right revision ankle fusion     HPI:  HPI The patient is a 57 year old gentleman seen 1 week status post right ankle revision.       Ortho Exam Incision well approximated with sutures. Moderate swelling. No erythema. No drainage. No sign of infection.  Visit Diagnoses: No diagnosis found.  Plan: follow up in office in 2 weeks with radiographs of right ankle. nonweight bearing. Begin daily dial soap cleansing and dry dressings. Elevate.  Follow-Up Instructions: No Follow-up on file.   Imaging: No results found.  Orders:  No orders of the defined types were placed in this encounter.  No orders of the defined types were placed in this encounter.    PMFS History: Patient Active Problem List   Diagnosis Date Noted  . Complex regional pain syndrome type 1 of right lower extremity 10/26/2016  . Foot drop, right foot 09/27/2016  . H/O ankle fusion 06/09/2016  . Acute hematogenous osteomyelitis of right fibula ([place])   . MRSA infection   . Therapeutic drug monitoring   . Phlebitis after infusion   . Septic arthritis of right ankle ([place]) 05/26/2016  . 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 Closed extra-articular fracture of distal end of right tibia   . Closed fracture of distal end of fibula, unspecified fracture morphology, initial encounter 01/17/2016  . Allergy 12/07/2011   Past Medical History:  Diagnosis Date  . Allergy   . Arthritis    "right ankle" (05/26/2016)  . Depression   . 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 GERD (gastroesophageal reflux disease)   . Headache    migraines  . Infection    RLE; MRSA  . Pre-diabetes     Family History  Problem Relation Age of Onset   . Cancer Mother     Past Surgical History:  Procedure Laterality Date  . ANKLE ARTHROSCOPY Right 05/26/2016  . ANKLE ARTHROSCOPY Right 05/26/2016   Procedure: Irrigation and Debridement Right Fibula and Arthroscopic Debridement Right Ankle, Apply Wound VAC;  Surgeon: Newt Minion, MD;  Location: [facility];  Service: Orthopedics;  Laterality: Right;  . ANKLE FUSION Right 05/31/2016   Procedure: IRRIGATION AND DEBRIDEMENT RIGHT ANKLE WITH FUSION;  Surgeon: Newt Minion, MD;  Location: [facility];  Service: Orthopedics;  Laterality: Right;  . ANKLE FUSION Right 02/23/2017   Procedure: REVISION RIGHT ANKLE FUSION WITH STIMULAN ANTIBIOTIC BEADS;  Surgeon: Newt Minion, MD;  Location: [facility];  Service: Orthopedics;  Laterality: Right;  . APPLICATION OF WOUND VAC  05/26/2016  . FRACTURE SURGERY    . HARDWARE REMOVAL Right 03/19/2016   Procedure: HARDWARE REMOVAL RIGHT ANKLE;  Surgeon: Newt Minion, MD;  Location: [facility];  Service: Orthopedics;  Laterality: Right;  . ORIF ANKLE FRACTURE Right 01/18/2016   Procedure: OPEN REDUCTION INTERNAL FIXATION (ORIF) ANKLE FRACTURE;  Surgeon: Newt Minion, MD;  Location: [facility];  Service: Orthopedics;  Laterality: Right;  . TIBIA DEBRIDEMENT Right 05/26/2016   Social History   Occupational History  . Occupation: Psychologist, occupational:  [employer]  Tobacco Use  . Smoking status: Current Every Day Smoker    Packs/day: 0.50    Years: 14.00    Pack years: 7.00    Types: Cigarettes  . Smokeless tobacco: Never Used  Substance and Sexual Activity  . Alcohol use: Yes    Comment: social  . Drug use: No  . Sexual activity: Yes    Partners: Female    Birth control/protection: None

## 2017-03-03 ENCOUNTER — Other Ambulatory Visit (INDEPENDENT_AMBULATORY_CARE_PROVIDER_SITE_OTHER): Payer: Self-pay | Admitting: Orthopedic Surgery

## 2017-03-03 ENCOUNTER — Telehealth (INDEPENDENT_AMBULATORY_CARE_PROVIDER_SITE_OTHER): Payer: Self-pay | Admitting: Orthopedic Surgery

## 2017-03-03 MED ORDER — OXYCODONE-ACETAMINOPHEN 5-325 MG PO TABS: 1.0000 | ORAL_TABLET | Freq: Three times a day (TID) | ORAL | 0 refills | Status: DC | PRN

## 2017-03-03 NOTE — Telephone Encounter (Signed)
ok 

## 2017-03-03 NOTE — Telephone Encounter (Signed)
Patient requesting RX refill on Oxycodone. CB # [phone]

## 2017-03-03 NOTE — Telephone Encounter (Signed)
Pt had refill of percocet 5/325 #60 02/23/17 and #18 02/17/17 s/p revision ankle fusion please advise.

## 2017-03-03 NOTE — Telephone Encounter (Signed)
I called and advised rx is at the front desk for pick up

## 2017-03-09 ENCOUNTER — Telehealth (INDEPENDENT_AMBULATORY_CARE_PROVIDER_SITE_OTHER): Payer: Self-pay | Admitting: Orthopedic Surgery

## 2017-03-09 NOTE — Telephone Encounter (Signed)
Please call pt to discuss his med.Pt feels like he is immune to Oxycodone and its not taking the pain away. Pt stated he is not sleeping at the moment would like to try new med for pain.

## 2017-03-09 NOTE — Telephone Encounter (Signed)
Call in rx for tramedol

## 2017-03-09 NOTE — Telephone Encounter (Signed)
Pt is s/p a revision ankle fusion. States that he is "immune" to his percocet and is requesting something different/stronger. Last refills 03/03/17 #10, 02/23/17 # 60, 02/18/16 #18 has had a total of 96 since 02/17/17 please advise.

## 2017-03-10 ENCOUNTER — Other Ambulatory Visit: Payer: Self-pay | Admitting: Internal Medicine

## 2017-03-10 ENCOUNTER — Other Ambulatory Visit (INDEPENDENT_AMBULATORY_CARE_PROVIDER_SITE_OTHER): Payer: Self-pay

## 2017-03-10 MED ORDER — TRAMADOL HCL 50 MG PO TABS: 50.0000 mg | ORAL_TABLET | Freq: Two times a day (BID) | ORAL | 0 refills | Status: DC | PRN

## 2017-03-10 MED ORDER — TRAZODONE HCL 50 MG PO TABS: 50.0000 mg | ORAL_TABLET | Freq: Every evening | ORAL | 3 refills | Status: AC | PRN

## 2017-03-10 NOTE — Telephone Encounter (Signed)
Called and sw pt to advise per Dr. Sharol Given Tramadol 1 po q 12 prn pain #40 this was called into the [facility] [address] to call with questions.

## 2017-03-10 NOTE — Progress Notes (Signed)
Patient called asking for refills on trazodone for sleep. I have refilled it and asked him to follow up with PCP for further prescription and assessment

## 2017-03-14 ENCOUNTER — Ambulatory Visit: Admitting: Internal Medicine

## 2017-03-16 ENCOUNTER — Encounter (INDEPENDENT_AMBULATORY_CARE_PROVIDER_SITE_OTHER): Payer: Self-pay | Admitting: Orthopedic Surgery

## 2017-03-16 ENCOUNTER — Ambulatory Visit (INDEPENDENT_AMBULATORY_CARE_PROVIDER_SITE_OTHER)

## 2017-03-16 ENCOUNTER — Ambulatory Visit (INDEPENDENT_AMBULATORY_CARE_PROVIDER_SITE_OTHER): Admitting: Orthopedic Surgery

## 2017-03-16 VITALS — Ht 69.0 in | Wt 246.0 lb

## 2017-03-16 DIAGNOSIS — M25571 Pain in right ankle and joints of right foot: Secondary | ICD-10-CM

## 2017-03-16 DIAGNOSIS — Z981 Arthrodesis status: Secondary | ICD-10-CM

## 2017-03-16 NOTE — Progress Notes (Addendum)
Office Visit Note   Patient: Scott Hebert           Date of Birth: July 26, 1960           MRN: [id number] Visit Date: 03/16/2017              Requested by: No referring provider defined for this encounter. PCP: Patient, [name]  Chief Complaint  Patient presents with  . Right Ankle - Routine Post Op    02/23/17 right revision ankle fusion       HPI: Patient presents in follow-up 3 weeks status post revision right ankle fusion.  Patient is not wearing the fracture boot.  He states he has 2 at home.  Patient states that he fell at home sustaining a twisting injury to his ankle.  He states that he is fallen 4 times due to giving way of his left knee and has been to the emergency room 3 times.  He was not wearing his boot.  Patient went to [facility] and he states that radiographs obtained there is stated that everything looked normal.  Assessment & Plan: Visit Diagnoses:  1. Pain in right ankle and joints of right foot   2. H/O ankle fusion     Plan: Recommended a cast.  Patient states he feels claustrophobic in the cast and will not tolerate a cast.  Recommend that he wear the fracture boot at all times he may take it off to bathe and shower.  Follow-Up Instructions: Return in about 2 weeks (around 03/30/2017).   Ortho Exam  Patient is alert, oriented, no adenopathy, well-dressed, normal affect, normal respiratory effort. On examination his foot is at 90 degrees.  There is no skin breakdown no dehiscence no signs of infection the fusion site appears stable.  Imaging: Xr Ankle Complete Right  Result Date: 03/16/2017 3 view radiographs of the right ankle shows backing out of the most distal screw from the ankle fusion.  No images are attached to the encounter.  Labs: Lab Results  Component Value Date   HGBA1C 6.8 (H) 02/23/2017   ESRSEDRATE 39 (H) 11/22/2016   ESRSEDRATE 36 (H) 10/18/2016   ESRSEDRATE 16 08/12/2016   CRP 15.7 (H) 11/22/2016   CRP 13.2 (H) 10/18/2016   CRP 6.7 08/12/2016   REPTSTATUS 06/01/2016 FINAL 05/26/2016   GRAMSTAIN  05/26/2016    ABUNDANT WBC PRESENT,BOTH PMN AND MONONUCLEAR NO ORGANISMS SEEN    CULT NO GROWTH 5 DAYS 05/26/2016   LABORGA METHICILLIN RESISTANT STAPHYLOCOCCUS AUREUS 05/26/2016    @LABSALLVALUES (HGBA1)@  Body mass index is 36.33 kg/m.  Orders:  Orders Placed This Encounter  Procedures  . XR Ankle Complete Right   No orders of the defined types were placed in this encounter.    Procedures: No procedures performed  Clinical Data: No additional findings.  ROS:  All other systems negative, except as noted in the HPI. Review of Systems  Objective: Vital Signs: Ht 5\' 9"  (1.753 m)   Wt 246 lb (111.6 kg)   BMI 36.33 kg/m   Specialty Comments:  No specialty comments available.  PMFS History: Patient Active Problem List   Diagnosis Date Noted  . Complex regional pain syndrome type 1 of right lower extremity 10/26/2016  . Foot drop, right foot 09/27/2016  . H/O ankle fusion 06/09/2016  . Acute hematogenous osteomyelitis of right fibula ([place])   . MRSA infection   . Therapeutic drug monitoring   . Phlebitis after infusion   . Septic arthritis  of right ankle ([place]) 05/26/2016  . 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 Closed extra-articular fracture of distal end of right tibia   . Closed fracture of distal end of fibula, unspecified fracture morphology, initial encounter 01/17/2016  . Allergy 12/07/2011   Past Medical History:  Diagnosis Date  . Allergy   . Arthritis    "right ankle" (05/26/2016)  . Depression   . 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 GERD (gastroesophageal reflux disease)   . Headache    migraines  . Infection    RLE; MRSA  . Pre-diabetes     Family History  Problem Relation Age of Onset  . Cancer Mother     Past Surgical History:  Procedure Laterality Date  . ANKLE ARTHROSCOPY Right 05/26/2016  . ANKLE ARTHROSCOPY Right 05/26/2016    Procedure: Irrigation and Debridement Right Fibula and Arthroscopic Debridement Right Ankle, Apply Wound VAC;  Surgeon: Newt Minion, MD;  Location: [facility];  Service: Orthopedics;  Laterality: Right;  . ANKLE FUSION Right 05/31/2016   Procedure: IRRIGATION AND DEBRIDEMENT RIGHT ANKLE WITH FUSION;  Surgeon: Newt Minion, MD;  Location: [facility];  Service: Orthopedics;  Laterality: Right;  . ANKLE FUSION Right 02/23/2017   Procedure: REVISION RIGHT ANKLE FUSION WITH STIMULAN ANTIBIOTIC BEADS;  Surgeon: Newt Minion, MD;  Location: [facility];  Service: Orthopedics;  Laterality: Right;  . APPLICATION OF WOUND VAC  05/26/2016  . FRACTURE SURGERY    . HARDWARE REMOVAL Right 03/19/2016   Procedure: HARDWARE REMOVAL RIGHT ANKLE;  Surgeon: Newt Minion, MD;  Location: [place];  Service: Orthopedics;  Laterality: Right;  . ORIF ANKLE FRACTURE Right 01/18/2016   Procedure: OPEN REDUCTION INTERNAL FIXATION (ORIF) ANKLE FRACTURE;  Surgeon: Newt Minion, MD;  Location: [facility];  Service: Orthopedics;  Laterality: Right;  . TIBIA DEBRIDEMENT Right 05/26/2016   Social History   Occupational History  . Occupation: Psychologist, occupational: [employer]  Tobacco Use  . Smoking status: Current Every Day Smoker    Packs/day: 0.50    Years: 14.00    Pack years: 7.00    Types: Cigarettes  . Smokeless tobacco: Never Used  Substance and Sexual Activity  . Alcohol use: Yes    Comment: social  . Drug use: No  . Sexual activity: Yes    Partners: Female    Birth control/protection: None

## 2017-03-30 ENCOUNTER — Ambulatory Visit (INDEPENDENT_AMBULATORY_CARE_PROVIDER_SITE_OTHER)

## 2017-03-30 ENCOUNTER — Ambulatory Visit (INDEPENDENT_AMBULATORY_CARE_PROVIDER_SITE_OTHER): Admitting: Orthopedic Surgery

## 2017-03-30 ENCOUNTER — Encounter (INDEPENDENT_AMBULATORY_CARE_PROVIDER_SITE_OTHER): Payer: Self-pay | Admitting: Orthopedic Surgery

## 2017-03-30 VITALS — Ht 69.0 in | Wt 246.0 lb

## 2017-03-30 DIAGNOSIS — M25571 Pain in right ankle and joints of right foot: Secondary | ICD-10-CM

## 2017-03-30 DIAGNOSIS — Z981 Arthrodesis status: Secondary | ICD-10-CM

## 2017-03-30 NOTE — Progress Notes (Signed)
xr right ankle  

## 2017-03-30 NOTE — Progress Notes (Signed)
Office Visit Note   Patient: Scott Hebert           Date of Birth: Jul 17, 1960           MRN: [id number] Visit Date: 03/30/2017              Requested by: No referring provider defined for this encounter. PCP: Patient, [name]  Chief Complaint  Patient presents with  . Right Ankle - Routine Post Op    02/23/17 right revision ankle fusion with ABX beads.       HPI: Patient presents in follow-up status post revision ankle fusion with antibiotic beads.  Patient is using a heel lift which makes his ankle feel better.  He states that this is the best his ankle has felt.  He is in the fracture boot nonweightbearing he has not fallen on this ankle since his last evaluation.  Assessment & Plan: Visit Diagnoses:  1. Pain in right ankle and joints of right foot     Plan: Continue with the fracture boot continue nonweightbearing.  Repeat 2 view radiographs of the right ankle at follow-up.  Follow-Up Instructions: Return in about 3 weeks (around 04/20/2017).   Ortho Exam  Patient is alert, oriented, no adenopathy, well-dressed, normal affect, normal respiratory effort. Examination the skin is well-healed there is no prominence from the hardware.  His foot is plantigrade there is no redness no cellulitis no signs of infection.  Imaging: Xr Ankle Complete Right  Result Date: 03/30/2017 2 view radiographs of the right ankle shows no further migration of the most distal screw.  The tibial talar joint is stable.  No other hardware complications.  No images are attached to the encounter.  Labs: Lab Results  Component Value Date   HGBA1C 6.8 (H) 02/23/2017   ESRSEDRATE 39 (H) 11/22/2016   ESRSEDRATE 36 (H) 10/18/2016   ESRSEDRATE 16 08/12/2016   CRP 15.7 (H) 11/22/2016   CRP 13.2 (H) 10/18/2016   CRP 6.7 08/12/2016   REPTSTATUS 06/01/2016 FINAL 05/26/2016   GRAMSTAIN  05/26/2016    ABUNDANT WBC PRESENT,BOTH PMN AND MONONUCLEAR NO ORGANISMS SEEN    CULT NO GROWTH 5 DAYS  05/26/2016   LABORGA METHICILLIN RESISTANT STAPHYLOCOCCUS AUREUS 05/26/2016    @LABSALLVALUES (HGBA1)@  Body mass index is 36.33 kg/m.  Orders:  Orders Placed This Encounter  Procedures  . XR Ankle Complete Right   No orders of the defined types were placed in this encounter.    Procedures: No procedures performed  Clinical Data: No additional findings.  ROS:  All other systems negative, except as noted in the HPI. Review of Systems  Objective: Vital Signs: Ht 5\' 9"  (1.753 m)   Wt 246 lb (111.6 kg)   BMI 36.33 kg/m   Specialty Comments:  No specialty comments available.  PMFS History: Patient Active Problem List   Diagnosis Date Noted  . Complex regional pain syndrome type 1 of right lower extremity 10/26/2016  . Foot drop, right foot 09/27/2016  . H/O ankle fusion 06/09/2016  . Acute hematogenous osteomyelitis of right fibula ([place])   . MRSA infection   . Therapeutic drug monitoring   . Phlebitis after infusion   . Septic arthritis of right ankle ([place]) 05/26/2016  . 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 Closed extra-articular fracture of distal end of right tibia   . Closed fracture of distal end of fibula, unspecified fracture morphology, initial encounter 01/17/2016  . Allergy 12/07/2011   Past Medical  History:  Diagnosis Date  . Allergy   . Arthritis    "right ankle" (05/26/2016)  . Depression   . 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 GERD (gastroesophageal reflux disease)   . Headache    migraines  . Infection    RLE; MRSA  . Pre-diabetes     Family History  Problem Relation Age of Onset  . Cancer Mother     Past Surgical History:  Procedure Laterality Date  . ANKLE ARTHROSCOPY Right 05/26/2016  . ANKLE ARTHROSCOPY Right 05/26/2016   Procedure: Irrigation and Debridement Right Fibula and Arthroscopic Debridement Right Ankle, Apply Wound VAC;  Surgeon: Newt Minion, MD;  Location: [place];  Service: Orthopedics;   Laterality: Right;  . ANKLE FUSION Right 05/31/2016   Procedure: IRRIGATION AND DEBRIDEMENT RIGHT ANKLE WITH FUSION;  Surgeon: Newt Minion, MD;  Location: [place];  Service: Orthopedics;  Laterality: Right;  . ANKLE FUSION Right 02/23/2017   Procedure: REVISION RIGHT ANKLE FUSION WITH STIMULAN ANTIBIOTIC BEADS;  Surgeon: Newt Minion, MD;  Location: [place];  Service: Orthopedics;  Laterality: Right;  . APPLICATION OF WOUND VAC  05/26/2016  . FRACTURE SURGERY    . HARDWARE REMOVAL Right 03/19/2016   Procedure: HARDWARE REMOVAL RIGHT ANKLE;  Surgeon: Newt Minion, MD;  Location: [place];  Service: Orthopedics;  Laterality: Right;  . ORIF ANKLE FRACTURE Right 01/18/2016   Procedure: OPEN REDUCTION INTERNAL FIXATION (ORIF) ANKLE FRACTURE;  Surgeon: Newt Minion, MD;  Location: [place];  Service: Orthopedics;  Laterality: Right;  . TIBIA DEBRIDEMENT Right 05/26/2016   Social History   Occupational History  . Occupation: Psychologist, occupational: [employer]  Tobacco Use  . Smoking status: Current Every Day Smoker    Packs/day: 0.50    Years: 14.00    Pack years: 7.00    Types: Cigarettes  . Smokeless tobacco: Never Used  Substance and Sexual Activity  . Alcohol use: Yes    Comment: social  . Drug use: No  . Sexual activity: Yes    Partners: Female    Birth control/protection: None

## 2017-04-01 ENCOUNTER — Telehealth (INDEPENDENT_AMBULATORY_CARE_PROVIDER_SITE_OTHER): Payer: Self-pay | Admitting: Orthopedic Surgery

## 2017-04-01 NOTE — Telephone Encounter (Signed)
Patient is requesting RX refill on tramadol. He said he is still in some pain but is getting better. # [phone]

## 2017-04-04 ENCOUNTER — Other Ambulatory Visit (INDEPENDENT_AMBULATORY_CARE_PROVIDER_SITE_OTHER): Payer: Self-pay | Admitting: Orthopedic Surgery

## 2017-04-04 MED ORDER — TRAMADOL HCL 50 MG PO TABS: 50.0000 mg | ORAL_TABLET | Freq: Two times a day (BID) | ORAL | 0 refills | Status: DC | PRN

## 2017-04-04 NOTE — Telephone Encounter (Signed)
rx written

## 2017-04-04 NOTE — Telephone Encounter (Signed)
rx called to pharm. Called pt to advise.

## 2017-04-04 NOTE — Telephone Encounter (Signed)
Pt calling requesting refill on tramadol. Last refill was 03/10/17 #40 and percocet rx 03/12/17 #16

## 2017-04-20 ENCOUNTER — Encounter (INDEPENDENT_AMBULATORY_CARE_PROVIDER_SITE_OTHER): Payer: Self-pay | Admitting: Orthopedic Surgery

## 2017-04-20 ENCOUNTER — Ambulatory Visit (INDEPENDENT_AMBULATORY_CARE_PROVIDER_SITE_OTHER)

## 2017-04-20 ENCOUNTER — Ambulatory Visit (INDEPENDENT_AMBULATORY_CARE_PROVIDER_SITE_OTHER): Admitting: Orthopedic Surgery

## 2017-04-20 VITALS — Ht 69.0 in | Wt 246.0 lb

## 2017-04-20 DIAGNOSIS — M25571 Pain in right ankle and joints of right foot: Secondary | ICD-10-CM | POA: Diagnosis not present

## 2017-04-20 DIAGNOSIS — Z981 Arthrodesis status: Secondary | ICD-10-CM

## 2017-04-20 NOTE — Progress Notes (Signed)
Office Visit Note   Patient: Scott Hebert           Date of Birth: November 23, 1960           MRN: [id number] Visit Date: 04/20/2017              Requested by: No referring provider defined for this encounter. PCP: Patient, [name]  Chief Complaint  Patient presents with  . Right Ankle - Routine Post Op    02/23/17 right ankle revision fusion with abx beads       HPI: Patient is a 57 year old gentleman who presents in follow-up status post revision ankle fusion right ankle with placement of antibiotic beads.  Patient is walking with his fracture boot in a 4 pronged cane patient denies any problems or symptoms.  Assessment & Plan: Visit Diagnoses:  1. Pain in right ankle and joints of right foot   2. H/O ankle fusion     Plan: We will have patient continue with the fracture boot and the cane weightbearing as tolerated follow-up in the office in 4 weeks with repeat 2 view radiographs of the right ankle.  Patient states that he can get the double upright brace or AFO free through the [place] so [facility]  Follow-Up Instructions: Return in about 1 month (around 05/18/2017).   Ortho Exam  Patient is alert, oriented, no adenopathy, well-dressed, normal affect, normal respiratory effort. Examination patient's foot is plantigrade there is no redness no cellulitis there is no tenderness to palpation the fusion appears stable his ankle was at 90 degrees.  Imaging: Xr Ankle 2 Views Right  Result Date: 04/20/2017 2 view radiographs of the right ankle shows stable fusion of the ankle joint without complicating features no hardware failure.  No images are attached to the encounter.  Labs: Lab Results  Component Value Date   HGBA1C 6.8 (H) 02/23/2017   ESRSEDRATE 39 (H) 11/22/2016   ESRSEDRATE 36 (H) 10/18/2016   ESRSEDRATE 16 08/12/2016   CRP 15.7 (H) 11/22/2016   CRP 13.2 (H) 10/18/2016   CRP 6.7 08/12/2016   REPTSTATUS 06/01/2016 FINAL  05/26/2016   GRAMSTAIN  05/26/2016    ABUNDANT WBC PRESENT,BOTH PMN AND MONONUCLEAR NO ORGANISMS SEEN    CULT NO GROWTH 5 DAYS 05/26/2016   LABORGA METHICILLIN RESISTANT STAPHYLOCOCCUS AUREUS 05/26/2016    @LABSALLVALUES (HGBA1)@  Body mass index is 36.33 kg/m.  Orders:  Orders Placed This Encounter  Procedures  . XR Ankle 2 Views Right   No orders of the defined types were placed in this encounter.    Procedures: No procedures performed  Clinical Data: No additional findings.  ROS:  All other systems negative, except as noted in the HPI. Review of Systems  Objective: Vital Signs: Ht 5\' 9"  (1.753 m)   Wt 246 lb (111.6 kg)   BMI 36.33 kg/m   Specialty Comments:  No specialty comments available.  PMFS History: Patient Active Problem List   Diagnosis Date Noted  . Complex regional pain syndrome type 1 of right lower extremity 10/26/2016  . Foot drop, right foot 09/27/2016  . H/O ankle fusion 06/09/2016  . Acute hematogenous osteomyelitis of right fibula ([place])   . MRSA infection   . Therapeutic drug monitoring   . Phlebitis after infusion   . Septic arthritis of right ankle ([place]) 05/26/2016  . 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 Closed extra-articular fracture of distal end of  right tibia   . Closed fracture of distal end of fibula, unspecified fracture morphology, initial encounter 01/17/2016  . Allergy 12/07/2011   Past Medical History:  Diagnosis Date  . Allergy   . Arthritis    "right ankle" (05/26/2016)  . Depression   . 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 GERD (gastroesophageal reflux disease)   . Headache    migraines  . Infection    RLE; MRSA  . Pre-diabetes     Family History  Problem Relation Age of Onset  . Cancer Mother     Past Surgical History:  Procedure Laterality Date  . ANKLE ARTHROSCOPY Right 05/26/2016  . ANKLE ARTHROSCOPY Right 05/26/2016   Procedure: Irrigation and Debridement Right Fibula  and Arthroscopic Debridement Right Ankle, Apply Wound VAC;  Surgeon: Newt Minion, MD;  Location: [place];  Service: Orthopedics;  Laterality: Right;  . ANKLE FUSION Right 05/31/2016   Procedure: IRRIGATION AND DEBRIDEMENT RIGHT ANKLE WITH FUSION;  Surgeon: Newt Minion, MD;  Location: [place];  Service: Orthopedics;  Laterality: Right;  . ANKLE FUSION Right 02/23/2017   Procedure: REVISION RIGHT ANKLE FUSION WITH STIMULAN ANTIBIOTIC BEADS;  Surgeon: Newt Minion, MD;  Location: [place];  Service: Orthopedics;  Laterality: Right;  . APPLICATION OF WOUND VAC  05/26/2016  . FRACTURE SURGERY    . HARDWARE REMOVAL Right 03/19/2016   Procedure: HARDWARE REMOVAL RIGHT ANKLE;  Surgeon: Newt Minion, MD;  Location: [place];  Service: Orthopedics;  Laterality: Right;  . ORIF ANKLE FRACTURE Right 01/18/2016   Procedure: OPEN REDUCTION INTERNAL FIXATION (ORIF) ANKLE FRACTURE;  Surgeon: Newt Minion, MD;  Location: [place];  Service: Orthopedics;  Laterality: Right;  . TIBIA DEBRIDEMENT Right 05/26/2016   Social History   Occupational History  . Occupation: Psychologist, occupational: [employer]  Tobacco Use  . Smoking status: Current Every Day Smoker    Packs/day: 0.50    Years: 14.00    Pack years: 7.00    Types: Cigarettes  . Smokeless tobacco: Never Used  Substance and Sexual Activity  . Alcohol use: Yes    Comment: social  . Drug use: No  . Sexual activity: Yes    Partners: Female    Birth control/protection: None

## 2017-04-25 ENCOUNTER — Telehealth (INDEPENDENT_AMBULATORY_CARE_PROVIDER_SITE_OTHER): Payer: Self-pay | Admitting: Physical Medicine and Rehabilitation

## 2017-04-25 NOTE — Telephone Encounter (Signed)
10/08/2016 NCS faxed to [facility], Attn: Freada Bergeron [phone]

## 2017-04-26 ENCOUNTER — Other Ambulatory Visit (INDEPENDENT_AMBULATORY_CARE_PROVIDER_SITE_OTHER): Payer: Self-pay | Admitting: Orthopedic Surgery

## 2017-04-26 ENCOUNTER — Telehealth (INDEPENDENT_AMBULATORY_CARE_PROVIDER_SITE_OTHER): Payer: Self-pay | Admitting: Orthopedic Surgery

## 2017-04-26 ENCOUNTER — Other Ambulatory Visit (INDEPENDENT_AMBULATORY_CARE_PROVIDER_SITE_OTHER): Payer: Self-pay

## 2017-04-26 MED ORDER — OXYCODONE-ACETAMINOPHEN 5-325 MG PO TABS: 1.0000 | ORAL_TABLET | Freq: Three times a day (TID) | ORAL | 0 refills | Status: DC | PRN

## 2017-04-26 NOTE — Telephone Encounter (Signed)
Called and lm on vm to advise that rx is a the front desk ready for pick up.

## 2017-04-26 NOTE — Telephone Encounter (Signed)
Patient called needing Rx refilled (Oxycodone) The number to contact patient is [phone]

## 2017-04-26 NOTE — Telephone Encounter (Signed)
Pt is s/p a right ankle revision fusion 02/13/17 and is requesting refill on his percocet last refill was 03/12/17 #16 and tramadol 04/04/17 #40

## 2017-04-26 NOTE — Telephone Encounter (Signed)
rx written

## 2017-05-10 ENCOUNTER — Other Ambulatory Visit (INDEPENDENT_AMBULATORY_CARE_PROVIDER_SITE_OTHER): Payer: Self-pay | Admitting: Orthopedic Surgery

## 2017-05-10 ENCOUNTER — Telehealth (INDEPENDENT_AMBULATORY_CARE_PROVIDER_SITE_OTHER): Payer: Self-pay | Admitting: Orthopedic Surgery

## 2017-05-10 MED ORDER — OXYCODONE-ACETAMINOPHEN 5-325 MG PO TABS: 1.0000 | ORAL_TABLET | Freq: Three times a day (TID) | ORAL | 0 refills | Status: DC | PRN

## 2017-05-10 NOTE — Telephone Encounter (Signed)
02/23/17 s/p right ankle revision fusion. Pt is requesting  A refill on his percocet 5/325. Last fill date 04/26/17 #30

## 2017-05-10 NOTE — Telephone Encounter (Signed)
Patient called requesting refill of Oxycodone. ° °Please call patient to advise. °

## 2017-05-10 NOTE — Telephone Encounter (Signed)
rx written

## 2017-05-10 NOTE — Telephone Encounter (Signed)
Called and lm on vm to advise that rx is at the front desk for pick up. To call with questions.  

## 2017-05-19 ENCOUNTER — Encounter (INDEPENDENT_AMBULATORY_CARE_PROVIDER_SITE_OTHER): Payer: Self-pay | Admitting: Orthopedic Surgery

## 2017-05-19 ENCOUNTER — Ambulatory Visit (INDEPENDENT_AMBULATORY_CARE_PROVIDER_SITE_OTHER)

## 2017-05-19 ENCOUNTER — Ambulatory Visit (INDEPENDENT_AMBULATORY_CARE_PROVIDER_SITE_OTHER): Admitting: Orthopedic Surgery

## 2017-05-19 DIAGNOSIS — M25571 Pain in right ankle and joints of right foot: Secondary | ICD-10-CM

## 2017-05-19 MED ORDER — OXYCODONE-ACETAMINOPHEN 5-325 MG PO TABS: 1.0000 | ORAL_TABLET | Freq: Three times a day (TID) | ORAL | 0 refills | Status: DC | PRN

## 2017-05-19 NOTE — Progress Notes (Signed)
Office Visit Note   Patient: Scott Hebert           Date of Birth: Oct 07, 1960           MRN: [id number] Visit Date: 05/19/2017              Requested by: No referring provider defined for this encounter. PCP: Patient, [name]  Chief Complaint  Patient presents with  . Right Ankle - Routine Post Op      HPI: Patient is seen 3 months status post revision right tibial talar fusion.  Patient states he still has some pain.  Patient states that the [place] is going to send him to the [facility]. [facility] also [facility] his double upright braces.  Assessment & Plan: Visit Diagnoses:  1. Pain in right ankle and joints of right foot     Plan: A prescription was provided for Percocet for pain.  A prescription was provided for extra-depth shoes custom orthotics and a double upright brace on the right.  He may proceed with physical therapy.  Patient is still out of work and will reevaluate in 4 weeks.  Follow-Up Instructions: Return in about 1 month (around 06/16/2017).   Ortho Exam  Patient is alert, oriented, no adenopathy, well-dressed, normal affect, normal respiratory effort. Examination patient's foot is plantigrade there is no redness no cellulitis no open wounds no drainage no signs of infection.  Imaging: Xr Ankle 2 Views Right  Result Date: 05/19/2017 2 view radiographs of the right ankle shows stable fusion of the tibial talar joint no complicating features no hardware failure.  No images are attached to the encounter.  Labs: Lab Results  Component Value Date   HGBA1C 6.8 (H) 02/23/2017   ESRSEDRATE 39 (H) 11/22/2016   ESRSEDRATE 36 (H) 10/18/2016   ESRSEDRATE 16 08/12/2016   CRP 15.7 (H) 11/22/2016   CRP 13.2 (H) 10/18/2016   CRP 6.7 08/12/2016   REPTSTATUS 06/01/2016 FINAL 05/26/2016   GRAMSTAIN  05/26/2016    ABUNDANT WBC PRESENT,BOTH PMN AND MONONUCLEAR NO ORGANISMS SEEN    CULT NO GROWTH 5 DAYS 05/26/2016   LABORGA METHICILLIN RESISTANT  STAPHYLOCOCCUS AUREUS 05/26/2016     Lab Results  Component Value Date   ALBUMIN 4.6 01/17/2016    There is no height or weight on file to calculate BMI.  Orders:  Orders Placed This Encounter  Procedures  . XR Ankle 2 Views Right   Meds ordered this encounter  Medications  . oxyCODONE-acetaminophen (PERCOCET/ROXICET) 5-325 MG tablet    Sig: Take 1 tablet by mouth every 8 (eight) hours as needed for severe pain.    Dispense:  20 tablet    Refill:  0     Procedures: No procedures performed  Clinical Data: No additional findings.  ROS:  All other systems negative, except as noted in the HPI. Review of Systems  Objective: Vital Signs: There were no vitals taken for this visit.  Specialty Comments:  No specialty comments available.  PMFS History: Patient Active Problem List   Diagnosis Date Noted  . Complex regional pain syndrome type 1 of right lower extremity 10/26/2016  . Foot drop, right foot 09/27/2016  . H/O ankle fusion 06/09/2016  . Acute hematogenous osteomyelitis of right fibula ([place])   . MRSA infection   . Therapeutic drug monitoring   . Phlebitis after infusion   . Septic arthritis of right ankle ([place]) 05/26/2016  . 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 Closed extra-articular fracture of distal end of right tibia   . Closed fracture of distal end of fibula, unspecified fracture morphology, initial encounter 01/17/2016  . Allergy 12/07/2011   Past Medical History:  Diagnosis Date  . Allergy   . Arthritis    "right ankle" (05/26/2016)  . Depression   . 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 GERD (gastroesophageal reflux disease)   . Headache    migraines  . Infection    RLE; MRSA  . Pre-diabetes     Family History  Problem Relation Age of Onset  . Cancer Mother     Past Surgical History:  Procedure Laterality Date  . ANKLE ARTHROSCOPY Right 05/26/2016  . ANKLE ARTHROSCOPY Right 05/26/2016   Procedure:  Irrigation and Debridement Right Fibula and Arthroscopic Debridement Right Ankle, Apply Wound VAC;  Surgeon: Newt Minion, MD;  Location: [place];  Service: Orthopedics;  Laterality: Right;  . ANKLE FUSION Right 05/31/2016   Procedure: IRRIGATION AND DEBRIDEMENT RIGHT ANKLE WITH FUSION;  Surgeon: Newt Minion, MD;  Location: [place];  Service: Orthopedics;  Laterality: Right;  . ANKLE FUSION Right 02/23/2017   Procedure: REVISION RIGHT ANKLE FUSION WITH STIMULAN ANTIBIOTIC BEADS;  Surgeon: Newt Minion, MD;  Location: [place];  Service: Orthopedics;  Laterality: Right;  . APPLICATION OF WOUND VAC  05/26/2016  . FRACTURE SURGERY    . HARDWARE REMOVAL Right 03/19/2016   Procedure: HARDWARE REMOVAL RIGHT ANKLE;  Surgeon: Newt Minion, MD;  Location: [place];  Service: Orthopedics;  Laterality: Right;  . ORIF ANKLE FRACTURE Right 01/18/2016   Procedure: OPEN REDUCTION INTERNAL FIXATION (ORIF) ANKLE FRACTURE;  Surgeon: Newt Minion, MD;  Location: [place];  Service: Orthopedics;  Laterality: Right;  . TIBIA DEBRIDEMENT Right 05/26/2016   Social History   Occupational History  . Occupation: Psychologist, occupational: [employer]  Tobacco Use  . Smoking status: Current Every Day Smoker    Packs/day: 0.50    Years: 14.00    Pack years: 7.00    Types: Cigarettes  . Smokeless tobacco: Never Used  Substance and Sexual Activity  . Alcohol use: Yes    Comment: social  . Drug use: No  . Sexual activity: Yes    Partners: Female    Birth control/protection: None

## 2017-06-16 ENCOUNTER — Ambulatory Visit (INDEPENDENT_AMBULATORY_CARE_PROVIDER_SITE_OTHER): Admitting: Orthopedic Surgery

## 2017-06-23 ENCOUNTER — Ambulatory Visit (INDEPENDENT_AMBULATORY_CARE_PROVIDER_SITE_OTHER): Admitting: Orthopedic Surgery

## 2017-06-27 ENCOUNTER — Ambulatory Visit (INDEPENDENT_AMBULATORY_CARE_PROVIDER_SITE_OTHER): Admitting: Orthopedic Surgery

## 2017-06-27 ENCOUNTER — Ambulatory Visit (INDEPENDENT_AMBULATORY_CARE_PROVIDER_SITE_OTHER)

## 2017-06-27 ENCOUNTER — Encounter (INDEPENDENT_AMBULATORY_CARE_PROVIDER_SITE_OTHER): Payer: Self-pay | Admitting: Orthopedic Surgery

## 2017-06-27 VITALS — Ht 69.0 in | Wt 246.0 lb

## 2017-06-27 DIAGNOSIS — Z981 Arthrodesis status: Secondary | ICD-10-CM

## 2017-06-27 MED ORDER — TRAMADOL HCL 50 MG PO TABS: 50.0000 mg | ORAL_TABLET | Freq: Two times a day (BID) | ORAL | 0 refills | Status: DC | PRN

## 2017-06-27 NOTE — Progress Notes (Signed)
Office Visit Note   Patient: Scott Hebert           Date of Birth: August 04, 1960           MRN: [id number] Visit Date: 06/27/2017              Requested by: No referring provider defined for this encounter. PCP: Patient, [name]  Chief Complaint  Patient presents with  . Right Ankle - Follow-up    02/23/17 revision right ankle fusion       HPI: Patient presents 4 months status post revision right ankle fusion.  He has venous swelling he is not wearing compression stockings he is wearing a fracture boot full weightbearing.  Assessment & Plan: Visit Diagnoses:  1. S/P ankle arthrodesis   2. H/O ankle fusion     Plan: Patient will continue the fracture boot he is given a prescription for tramadol recommended that he wear his compression stockings daily ideally around-the-clock.  Reevaluate in 4 weeks with repeat three-view radiographs of the right ankle.  Follow-Up Instructions: Return in about 1 month (around 07/25/2017).   Ortho Exam  Patient is alert, oriented, no adenopathy, well-dressed, normal affect, normal respiratory effort. Examination patient's foot is plantigrade his ankle was at 90 degrees.  He does have motion to the.  There is no redness no cellulitis no signs or symptoms of DVT but he does have venous stasis swelling.  Imaging: Xr Ankle Complete Right  Result Date: 06/27/2017 3 view radiographs of the right ankle shows stable internal fixation with no hardware failure the ankle appears to be fusing well.  No images are attached to the encounter.  Labs: Lab Results  Component Value Date   HGBA1C 6.8 (H) 02/23/2017   ESRSEDRATE 39 (H) 11/22/2016   ESRSEDRATE 36 (H) 10/18/2016   ESRSEDRATE 16 08/12/2016   CRP 15.7 (H) 11/22/2016   CRP 13.2 (H) 10/18/2016   CRP 6.7 08/12/2016   REPTSTATUS 06/01/2016 FINAL 05/26/2016   GRAMSTAIN  05/26/2016    ABUNDANT WBC PRESENT,BOTH PMN AND MONONUCLEAR NO ORGANISMS SEEN    CULT NO GROWTH 5 DAYS 05/26/2016   LABORGA METHICILLIN RESISTANT STAPHYLOCOCCUS AUREUS 05/26/2016     Lab Results  Component Value Date   ALBUMIN 4.6 01/17/2016    Body mass index is 36.33 kg/m.  Orders:  Orders Placed This Encounter  Procedures  . XR Ankle Complete Right   Meds ordered this encounter  Medications  . traMADol (ULTRAM) 50 MG tablet    Sig: Take 1 tablet (50 mg total) by mouth every 12 (twelve) hours as needed for moderate pain.    Dispense:  30 tablet    Refill:  0     Procedures: No procedures performed  Clinical Data: No additional findings.  ROS:  All other systems negative, except as noted in the HPI. Review of Systems  Objective: Vital Signs: Ht 5\' 9"  (1.753 m)   Wt 246 lb (111.6 kg)   BMI 36.33 kg/m   Specialty Comments:  No specialty comments available.  PMFS History: Patient Active Problem List   Diagnosis Date Noted  . Complex regional pain syndrome type 1 of right lower extremity 10/26/2016  . Foot drop, right foot 09/27/2016  . H/O ankle fusion 06/09/2016  . Acute hematogenous osteomyelitis of right fibula ([place])   . MRSA infection   . Therapeutic drug monitoring   . Phlebitis after infusion   . Septic arthritis of right ankle ([place]) 05/26/2016  . Infected hardware  in right leg, sequela 03/18/2016  . Closed extra-articular fracture of distal end of right tibia   . Closed fracture of distal end of fibula, unspecified fracture morphology, initial encounter 01/17/2016  . Allergy 12/07/2011   Past Medical History:  Diagnosis Date  . Allergy   . Arthritis    "right ankle" (05/26/2016)  . Depression   . 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 GERD (gastroesophageal reflux disease)   . Headache    migraines  . Infection    RLE; MRSA  . Pre-diabetes     Family History  Problem Relation Age of Onset  . Cancer Mother     Past Surgical History:  Procedure Laterality Date  . ANKLE ARTHROSCOPY Right 05/26/2016  . ANKLE ARTHROSCOPY Right 05/26/2016     Procedure: Irrigation and Debridement Right Fibula and Arthroscopic Debridement Right Ankle, Apply Wound VAC;  Surgeon: Newt Minion, MD;  Location: [place];  Service: Orthopedics;  Laterality: Right;  . ANKLE FUSION Right 05/31/2016   Procedure: IRRIGATION AND DEBRIDEMENT RIGHT ANKLE WITH FUSION;  Surgeon: Newt Minion, MD;  Location: [place];  Service: Orthopedics;  Laterality: Right;  . ANKLE FUSION Right 02/23/2017   Procedure: REVISION RIGHT ANKLE FUSION WITH STIMULAN ANTIBIOTIC BEADS;  Surgeon: Newt Minion, MD;  Location: [place];  Service: Orthopedics;  Laterality: Right;  . APPLICATION OF WOUND VAC  05/26/2016  . FRACTURE SURGERY    . HARDWARE REMOVAL Right 03/19/2016   Procedure: HARDWARE REMOVAL RIGHT ANKLE;  Surgeon: Newt Minion, MD;  Location: [place];  Service: Orthopedics;  Laterality: Right;  . ORIF ANKLE FRACTURE Right 01/18/2016   Procedure: OPEN REDUCTION INTERNAL FIXATION (ORIF) ANKLE FRACTURE;  Surgeon: Newt Minion, MD;  Location: [place];  Service: Orthopedics;  Laterality: Right;  . TIBIA DEBRIDEMENT Right 05/26/2016   Social History   Occupational History  . Occupation: Psychologist, occupational: [employer]  Tobacco Use  . Smoking status: Current Every Day Smoker    Packs/day: 0.50    Years: 14.00    Pack years: 7.00    Types: Cigarettes  . Smokeless tobacco: Never Used  Substance and Sexual Activity  . Alcohol use: Yes    Comment: social  . Drug use: No  . Sexual activity: Yes    Partners: Female    Birth control/protection: None

## 2017-07-22 ENCOUNTER — Telehealth (INDEPENDENT_AMBULATORY_CARE_PROVIDER_SITE_OTHER): Payer: Self-pay | Admitting: Orthopedic Surgery

## 2017-07-22 ENCOUNTER — Other Ambulatory Visit (INDEPENDENT_AMBULATORY_CARE_PROVIDER_SITE_OTHER): Payer: Self-pay

## 2017-07-22 MED ORDER — TRAMADOL HCL 50 MG PO TABS: 50.0000 mg | ORAL_TABLET | Freq: Two times a day (BID) | ORAL | 0 refills | Status: DC | PRN

## 2017-07-22 NOTE — Telephone Encounter (Signed)
Ok refill? 

## 2017-07-22 NOTE — Telephone Encounter (Signed)
Patient called and requested a refill of Tramadol.  Please call paient to advise  [phone]

## 2017-07-22 NOTE — Telephone Encounter (Signed)
rx called into pharm. Called pt to advise. Lm on vm to call with questions.

## 2017-07-22 NOTE — Telephone Encounter (Signed)
Pt requesting refill on tramadol last refill was 06/27/17 #30

## 2017-08-03 ENCOUNTER — Encounter (INDEPENDENT_AMBULATORY_CARE_PROVIDER_SITE_OTHER): Payer: Self-pay | Admitting: Orthopedic Surgery

## 2017-08-03 ENCOUNTER — Ambulatory Visit (INDEPENDENT_AMBULATORY_CARE_PROVIDER_SITE_OTHER): Admitting: Orthopedic Surgery

## 2017-08-03 ENCOUNTER — Ambulatory Visit (INDEPENDENT_AMBULATORY_CARE_PROVIDER_SITE_OTHER)

## 2017-08-03 VITALS — Ht 69.0 in | Wt 246.0 lb

## 2017-08-03 DIAGNOSIS — Z981 Arthrodesis status: Secondary | ICD-10-CM

## 2017-08-03 MED ORDER — DICLOFENAC SODIUM 1 % TD GEL: 2.0000 g | Freq: Four times a day (QID) | TRANSDERMAL | 0 refills | Status: AC

## 2017-08-03 NOTE — Progress Notes (Signed)
Office Visit Note   Patient: Scott Hebert           Date of Birth: [birth year]           MRN: [id number] Visit Date: 08/03/2017              Requested by: No referring provider defined for this encounter. PCP: Patient, [name]  Chief Complaint  Patient presents with  . Right Ankle - Follow-up    02/23/17  Revision Right Ankle Fusion with Stimulan Antibiotic Beads      HPI: Patient is a 57 year old gentleman who presents 5 months status post revision right ankle fusion with antibiotic beads.  Patient states he has pain over the tendons medial and lateral to the ankle.  Assessment & Plan: Visit Diagnoses:  1. S/P ankle arthrodesis   2. H/O ankle fusion     Plan: A new prescription was called in for Voltaren gel for the tendinitis.  Patient was given  another prescription for extra-depth shoes custom orthotics heel lift and double upright braces.  Patient was also given a prescription for physical therapy progressive ambulation weightbearing as tolerated with the use of the boot or shoe and bracing.  Repeat three-view radiographs of the right ankle at follow-up.  Follow-Up Instructions: Return in about 1 month (around 08/31/2017).   Ortho Exam  Patient is alert, oriented, no adenopathy, well-dressed, normal affect, normal respiratory effort. Examination patient's foot is plantigrade.  He does have shortening of the right leg compared to the left due to the revision fusion.  There is no redness no cellulitis there is some swelling.  He is tender to palpation over the peroneal tendons and posterior tibial tendon the Achilles tendon is nontender to palpation the plantar fascia is nontender to palpation.  He has no tenderness to palpation over the fusion site.  Imaging: Xr Ankle Complete Right  Result Date: 08/03/2017 3 view radiographs of the right ankle shows stable fusion of the ankle joint  No images are attached to the encounter.  Labs: Lab Results  Component Value  Date   HGBA1C 6.8 (H) 02/23/2017   ESRSEDRATE 39 (H) 11/22/2016   ESRSEDRATE 36 (H) 10/18/2016   ESRSEDRATE 16 08/12/2016   CRP 15.7 (H) 11/22/2016   CRP 13.2 (H) 10/18/2016   CRP 6.7 08/12/2016   REPTSTATUS 06/01/2016 FINAL 05/26/2016   GRAMSTAIN  05/26/2016    ABUNDANT WBC PRESENT,BOTH PMN AND MONONUCLEAR NO ORGANISMS SEEN    CULT NO GROWTH 5 DAYS 05/26/2016   LABORGA METHICILLIN RESISTANT STAPHYLOCOCCUS AUREUS 05/26/2016     Lab Results  Component Value Date   ALBUMIN 4.6 01/17/2016    Body mass index is 36.33 kg/m.  Orders:  Orders Placed This Encounter  Procedures  . XR Ankle Complete Right   Meds ordered this encounter  Medications  . diclofenac sodium (VOLTAREN) 1 % GEL    Sig: Apply 2 g topically 4 (four) times daily.    Dispense:  100 g    Refill:  0     Procedures: No procedures performed  Clinical Data: No additional findings.  ROS:  All other systems negative, except as noted in the HPI. Review of Systems  Objective: Vital Signs: Ht 5\' 9"  (1.753 m)   Wt 246 lb (111.6 kg)   BMI 36.33 kg/m   Specialty Comments:  No specialty comments available.  PMFS History: Patient Active Problem List   Diagnosis Date Noted  . Complex regional pain syndrome type 1 of  right lower extremity 10/26/2016  . Foot drop, right foot 09/27/2016  . H/O ankle fusion 06/09/2016  . Acute hematogenous osteomyelitis of right fibula ([place])   . MRSA infection   . Therapeutic drug monitoring   . Phlebitis after infusion   . Septic arthritis of right ankle ([place]) 05/26/2016  . 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 Closed extra-articular fracture of distal end of right tibia   . Closed fracture of distal end of fibula, unspecified fracture morphology, initial encounter 01/17/2016  . Allergy 12/07/2011   Past Medical History:  Diagnosis Date  . Allergy   . Arthritis    "right ankle" (05/26/2016)  . Depression   . Fracture healed by fibrous union     right ankle  . GERD (gastroesophageal reflux disease)   . Headache    migraines  . Infection    RLE; MRSA  . Pre-diabetes     Family History  Problem Relation Age of Onset  . Cancer Mother     Past Surgical History:  Procedure Laterality Date  . ANKLE ARTHROSCOPY Right 05/26/2016  . ANKLE ARTHROSCOPY Right 05/26/2016   Procedure: Irrigation and Debridement Right Fibula and Arthroscopic Debridement Right Ankle, Apply Wound VAC;  Surgeon: Newt Minion, MD;  Location: [place];  Service: Orthopedics;  Laterality: Right;  . ANKLE FUSION Right 05/31/2016   Procedure: IRRIGATION AND DEBRIDEMENT RIGHT ANKLE WITH FUSION;  Surgeon: Newt Minion, MD;  Location: [place];  Service: Orthopedics;  Laterality: Right;  . ANKLE FUSION Right 02/23/2017   Procedure: REVISION RIGHT ANKLE FUSION WITH STIMULAN ANTIBIOTIC BEADS;  Surgeon: Newt Minion, MD;  Location: [place];  Service: Orthopedics;  Laterality: Right;  . APPLICATION OF WOUND VAC  05/26/2016  . FRACTURE SURGERY    . HARDWARE REMOVAL Right 03/19/2016   Procedure: HARDWARE REMOVAL RIGHT ANKLE;  Surgeon: Newt Minion, MD;  Location: [place];  Service: Orthopedics;  Laterality: Right;  . ORIF ANKLE FRACTURE Right 01/18/2016   Procedure: OPEN REDUCTION INTERNAL FIXATION (ORIF) ANKLE FRACTURE;  Surgeon: Newt Minion, MD;  Location: [place];  Service: Orthopedics;  Laterality: Right;  . TIBIA DEBRIDEMENT Right 05/26/2016   Social History   Occupational History  . Occupation: Psychologist, occupational: [employer]  Tobacco Use  . Smoking status: Current Every Day Smoker    Packs/day: 0.50    Years: 14.00    Pack years: 7.00    Types: Cigarettes  . Smokeless tobacco: Never Used  Substance and Sexual Activity  . Alcohol use: Yes    Comment: social  . Drug use: No  . Sexual activity: Yes    Partners: Female    Birth control/protection: None

## 2017-08-15 ENCOUNTER — Telehealth (INDEPENDENT_AMBULATORY_CARE_PROVIDER_SITE_OTHER): Payer: Self-pay | Admitting: Orthopedic Surgery

## 2017-08-15 ENCOUNTER — Other Ambulatory Visit (INDEPENDENT_AMBULATORY_CARE_PROVIDER_SITE_OTHER): Payer: Self-pay | Admitting: Orthopedic Surgery

## 2017-08-15 MED ORDER — OXYCODONE-ACETAMINOPHEN 5-325 MG PO TABS: 1.0000 | ORAL_TABLET | Freq: Three times a day (TID) | ORAL | 0 refills | Status: DC | PRN

## 2017-08-15 NOTE — Telephone Encounter (Signed)
Please advise 

## 2017-08-15 NOTE — Telephone Encounter (Signed)
rx written

## 2017-08-15 NOTE — Telephone Encounter (Signed)
I called patient and advised, script at front for pick up.

## 2017-08-15 NOTE — Telephone Encounter (Signed)
Patient requesting rx refill on pain medication, he states tramadol is not helping him and that he has been in a lot of pain due to physical therapy pushing him hard. Patient also states he hasn't been sleeping the past 3 nights due to the pain and that he has no energy to do anything. Patients # [phone]

## 2017-08-31 ENCOUNTER — Ambulatory Visit (INDEPENDENT_AMBULATORY_CARE_PROVIDER_SITE_OTHER): Admitting: Orthopedic Surgery

## 2017-09-01 ENCOUNTER — Ambulatory Visit (INDEPENDENT_AMBULATORY_CARE_PROVIDER_SITE_OTHER): Admitting: Orthopedic Surgery

## 2017-09-01 ENCOUNTER — Encounter (INDEPENDENT_AMBULATORY_CARE_PROVIDER_SITE_OTHER): Payer: Self-pay | Admitting: Orthopedic Surgery

## 2017-09-01 VITALS — Ht 69.0 in | Wt 246.0 lb

## 2017-09-01 DIAGNOSIS — Z981 Arthrodesis status: Secondary | ICD-10-CM | POA: Diagnosis not present

## 2017-09-01 MED ORDER — OXYCODONE-ACETAMINOPHEN 5-325 MG PO TABS: 1.0000 | ORAL_TABLET | Freq: Three times a day (TID) | ORAL | 0 refills | Status: DC | PRN

## 2017-09-05 ENCOUNTER — Encounter (INDEPENDENT_AMBULATORY_CARE_PROVIDER_SITE_OTHER): Payer: Self-pay | Admitting: Orthopedic Surgery

## 2017-09-05 NOTE — Progress Notes (Signed)
Office Visit Note   Patient: Scott Hebert           Date of Birth: February 20, 1960           MRN: [id number] Visit Date: 09/01/2017              Requested by: No referring provider defined for this encounter. PCP: Patient, [name]  Chief Complaint  Patient presents with  . Right Ankle - Follow-up    02/23/17 revision right ankle fusion       HPI: Patient is a 57 year old gentleman who is status post right ankle fusion approximately 6 months ago.  Patient states he still has pain across the hindfoot he has been doing physical therapy 3 times a week.  Patient demonstrates health physical therapy at the [place] is trying to manipulate and move the fusion site.  Patient states that the main focus of physical therapy has been to try to get motion at his fusion site.  Assessment & Plan: Visit Diagnoses:  1. S/P ankle arthrodesis     Plan: Patient was given a prescription with instructions for not trying to manipulate the fusion site.  Patient was given a prescription for strength and conditioning.  Repeat three-view radiographs of the right ankle at follow-up.  Follow-Up Instructions: Return in about 4 weeks (around 09/29/2017).   Ortho Exam  Patient is alert, oriented, no adenopathy, well-dressed, normal affect, normal respiratory effort. Examination patient has a good pulse he has an antalgic gait.  Patient has pain to palpation across the hindfoot.  There is some crepitation in the midfoot.  There is no redness no cellulitis no signs of infection.  Imaging: No results found. No images are attached to the encounter.  Labs: Lab Results  Component Value Date   HGBA1C 6.8 (H) 02/23/2017   ESRSEDRATE 39 (H) 11/22/2016   ESRSEDRATE 36 (H) 10/18/2016   ESRSEDRATE 16 08/12/2016   CRP 15.7 (H) 11/22/2016   CRP 13.2 (H) 10/18/2016   CRP 6.7 08/12/2016   REPTSTATUS 06/01/2016 FINAL 05/26/2016   GRAMSTAIN  05/26/2016    ABUNDANT WBC PRESENT,BOTH PMN AND MONONUCLEAR NO ORGANISMS SEEN    CULT NO GROWTH 5 DAYS 05/26/2016   LABORGA METHICILLIN RESISTANT STAPHYLOCOCCUS AUREUS 05/26/2016     Lab Results  Component Value Date   ALBUMIN 4.6 01/17/2016    Body mass index is 36.33 kg/m.  Orders:  No orders of the defined types were placed in this encounter.  Meds ordered this encounter  Medications  . oxyCODONE-acetaminophen (PERCOCET/ROXICET) 5-325 MG tablet    Sig: Take 1 tablet by mouth every 8 (eight) hours as needed.    Dispense:  20 tablet    Refill:  0     Procedures: No procedures performed  Clinical Data: No additional findings.  ROS:  All other systems negative, except as noted in the HPI. Review of Systems  Objective: Vital Signs: Ht 5\' 9"  (1.753 m)   Wt 246 lb (111.6 kg)   BMI 36.33 kg/m   Specialty Comments:  No specialty comments available.  PMFS History: Patient Active Problem List   Diagnosis Date Noted  . Complex regional pain syndrome type 1 of right lower extremity 10/26/2016  . Foot drop, right foot 09/27/2016  . H/O ankle fusion 06/09/2016  . Acute hematogenous osteomyelitis of right fibula ([place])   . MRSA infection   . Therapeutic drug monitoring   . Phlebitis after infusion   . Septic arthritis of right ankle ([place]) 05/26/2016  .  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 Closed extra-articular fracture of distal end of right tibia   . Closed fracture of distal end of fibula, unspecified fracture morphology, initial encounter 01/17/2016  . Allergy 12/07/2011   Past Medical History:  Diagnosis Date  . Allergy   . Arthritis    "right ankle" (05/26/2016)  . Depression   . 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 GERD (gastroesophageal reflux disease)   . Headache    migraines  . Infection    RLE; MRSA  . Pre-diabetes     Family History  Problem Relation Age of Onset  . Cancer Mother     Past Surgical History:  Procedure Laterality Date  . ANKLE ARTHROSCOPY Right 05/26/2016  . ANKLE  ARTHROSCOPY Right 05/26/2016   Procedure: Irrigation and Debridement Right Fibula and Arthroscopic Debridement Right Ankle, Apply Wound VAC;  Surgeon: Newt Minion, MD;  Location: [facility];  Service: Orthopedics;  Laterality: Right;  . ANKLE FUSION Right 05/31/2016   Procedure: IRRIGATION AND DEBRIDEMENT RIGHT ANKLE WITH FUSION;  Surgeon: Newt Minion, MD;  Location: [facility];  Service: Orthopedics;  Laterality: Right;  . ANKLE FUSION Right 02/23/2017   Procedure: REVISION RIGHT ANKLE FUSION WITH STIMULAN ANTIBIOTIC BEADS;  Surgeon: Newt Minion, MD;  Location: [place];  Service: Orthopedics;  Laterality: Right;  . APPLICATION OF WOUND VAC  05/26/2016  . FRACTURE SURGERY    . HARDWARE REMOVAL Right 03/19/2016   Procedure: HARDWARE REMOVAL RIGHT ANKLE;  Surgeon: Newt Minion, MD;  Location: [place];  Service: Orthopedics;  Laterality: Right;  . ORIF ANKLE FRACTURE Right 01/18/2016   Procedure: OPEN REDUCTION INTERNAL FIXATION (ORIF) ANKLE FRACTURE;  Surgeon: Newt Minion, MD;  Location: [facility];  Service: Orthopedics;  Laterality: Right;  . TIBIA DEBRIDEMENT Right 05/26/2016   Social History   Occupational History  . Occupation: Psychologist, occupational: [employer]  Tobacco Use  . Smoking status: Current Every Day Smoker    Packs/day: 0.50    Years: 14.00    Pack years: 7.00    Types: Cigarettes  . Smokeless tobacco: Never Used  Substance and Sexual Activity  . Alcohol use: Yes    Comment: social  . Drug use: No  . Sexual activity: Yes    Partners: Female    Birth control/protection: None

## 2017-09-28 ENCOUNTER — Ambulatory Visit (INDEPENDENT_AMBULATORY_CARE_PROVIDER_SITE_OTHER): Admitting: Family

## 2017-09-28 ENCOUNTER — Encounter (INDEPENDENT_AMBULATORY_CARE_PROVIDER_SITE_OTHER): Payer: Self-pay | Admitting: Family

## 2017-09-28 ENCOUNTER — Ambulatory Visit (INDEPENDENT_AMBULATORY_CARE_PROVIDER_SITE_OTHER): Admitting: Orthopedic Surgery

## 2017-09-28 ENCOUNTER — Ambulatory Visit (INDEPENDENT_AMBULATORY_CARE_PROVIDER_SITE_OTHER)

## 2017-09-28 VITALS — Ht 69.0 in | Wt 246.0 lb

## 2017-09-28 DIAGNOSIS — M25571 Pain in right ankle and joints of right foot: Secondary | ICD-10-CM | POA: Diagnosis not present

## 2017-09-28 DIAGNOSIS — Z981 Arthrodesis status: Secondary | ICD-10-CM

## 2017-09-28 MED ORDER — OXYCODONE-ACETAMINOPHEN 5-325 MG PO TABS: 1.0000 | ORAL_TABLET | Freq: Three times a day (TID) | ORAL | 0 refills | Status: DC | PRN

## 2017-09-28 NOTE — Progress Notes (Signed)
Office Visit Note   Patient: Scott Hebert           Date of Birth: June 29, 1960           MRN: [id number] Visit Date: 09/28/2017              Requested by: No referring provider defined for this encounter. PCP: Patient, [name]  Chief Complaint  Patient presents with  . Right Ankle - Follow-up, Pain      HPI: Patient is a 57 year old gentleman who is status post right ankle fusion approximately 7 months ago.  Patient states he still has pain across the heel and ankle circumferentially. He has stopped PT. Is in the process of getting a double upright brace and custom orthotics.   Assessment & Plan: Visit Diagnoses:  1. Pain in right ankle and joints of right foot   2. H/O ankle fusion     Plan: Continue with [facility] until brace fabricated. Would like to follow up in office in 4 more weeks with dr duda. Given a refill of pain medication. Is working on tapering off.   Follow-Up Instructions: No follow-ups on file.   Ortho Exam  Patient is alert, oriented, no adenopathy, well-dressed, normal affect, normal respiratory effort. Examination patient has a good pulse he has an antalgic gait.  Patient has pain to palpation across the hindfoot. There is no redness no cellulitis no signs of infection.  Imaging: No results found. No images are attached to the encounter.  Labs: Lab Results  Component Value Date   HGBA1C 6.8 (H) 02/23/2017   ESRSEDRATE 39 (H) 11/22/2016   ESRSEDRATE 36 (H) 10/18/2016   ESRSEDRATE 16 08/12/2016   CRP 15.7 (H) 11/22/2016   CRP 13.2 (H) 10/18/2016   CRP 6.7 08/12/2016   REPTSTATUS 06/01/2016 FINAL 05/26/2016   GRAMSTAIN  05/26/2016    ABUNDANT WBC PRESENT,BOTH PMN AND MONONUCLEAR NO ORGANISMS SEEN    CULT NO GROWTH 5 DAYS 05/26/2016   LABORGA METHICILLIN RESISTANT STAPHYLOCOCCUS AUREUS 05/26/2016     Lab Results  Component Value Date   ALBUMIN 4.6 01/17/2016    Body mass index is 36.33 kg/m.  Orders:  Orders Placed This Encounter    Procedures  . XR Ankle Complete Right   Meds ordered this encounter  Medications  . oxyCODONE-acetaminophen (PERCOCET/ROXICET) 5-325 MG tablet    Sig: Take 1 tablet by mouth every 8 (eight) hours as needed.    Dispense:  20 tablet    Refill:  0     Procedures: No procedures performed  Clinical Data: No additional findings.  ROS:  All other systems negative, except as noted in the HPI. Review of Systems  Constitutional: Negative for chills and fever.  Musculoskeletal: Positive for arthralgias and joint swelling.    Objective: Vital Signs: Ht 5\' 9"  (1.753 m)   Wt 246 lb (111.6 kg)   BMI 36.33 kg/m   Specialty Comments:  No specialty comments available.  PMFS History: Patient Active Problem List   Diagnosis Date Noted  . Complex regional pain syndrome type 1 of right lower extremity 10/26/2016  . Foot drop, right foot 09/27/2016  . H/O ankle fusion 06/09/2016  . Acute hematogenous osteomyelitis of right fibula ([place])   . MRSA infection   . Therapeutic drug monitoring   . Phlebitis after infusion   . Septic arthritis of right ankle ([place]) 05/26/2016  . 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 Closed extra-articular fracture of distal end of  right tibia   . Closed fracture of distal end of fibula, unspecified fracture morphology, initial encounter 01/17/2016  . Allergy 12/07/2011   Past Medical History:  Diagnosis Date  . Allergy   . Arthritis    "right ankle" (05/26/2016)  . Depression   . 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 GERD (gastroesophageal reflux disease)   . Headache    migraines  . Infection    RLE; MRSA  . Pre-diabetes     Family History  Problem Relation Age of Onset  . Cancer Mother     Past Surgical History:  Procedure Laterality Date  . ANKLE ARTHROSCOPY Right 05/26/2016  . ANKLE ARTHROSCOPY Right 05/26/2016   Procedure: Irrigation and Debridement Right Fibula and Arthroscopic Debridement Right Ankle, Apply  Wound VAC;  Surgeon: Newt Minion, MD;  Location: [facility];  Service: Orthopedics;  Laterality: Right;  . ANKLE FUSION Right 05/31/2016   Procedure: IRRIGATION AND DEBRIDEMENT RIGHT ANKLE WITH FUSION;  Surgeon: Newt Minion, MD;  Location: [facility];  Service: Orthopedics;  Laterality: Right;  . ANKLE FUSION Right 02/23/2017   Procedure: REVISION RIGHT ANKLE FUSION WITH STIMULAN ANTIBIOTIC BEADS;  Surgeon: Newt Minion, MD;  Location: [facility];  Service: Orthopedics;  Laterality: Right;  . APPLICATION OF WOUND VAC  05/26/2016  . FRACTURE SURGERY    . HARDWARE REMOVAL Right 03/19/2016   Procedure: HARDWARE REMOVAL RIGHT ANKLE;  Surgeon: Newt Minion, MD;  Location: [facility];  Service: Orthopedics;  Laterality: Right;  . ORIF ANKLE FRACTURE Right 01/18/2016   Procedure: OPEN REDUCTION INTERNAL FIXATION (ORIF) ANKLE FRACTURE;  Surgeon: Newt Minion, MD;  Location: [facility];  Service: Orthopedics;  Laterality: Right;  . TIBIA DEBRIDEMENT Right 05/26/2016   Social History   Occupational History  . Occupation: Psychologist, occupational: [employer]  Tobacco Use  . Smoking status: Current Every Day Smoker    Packs/day: 0.50    Years: 14.00    Pack years: 7.00    Types: Cigarettes  . Smokeless tobacco: Never Used  Substance and Sexual Activity  . Alcohol use: Yes    Comment: social  . Drug use: No  . Sexual activity: Yes    Partners: Female    Birth control/protection: None

## 2017-10-31 ENCOUNTER — Ambulatory Visit (INDEPENDENT_AMBULATORY_CARE_PROVIDER_SITE_OTHER): Admitting: Orthopedic Surgery

## 2017-10-31 DIAGNOSIS — Z981 Arthrodesis status: Secondary | ICD-10-CM

## 2017-11-01 ENCOUNTER — Encounter (INDEPENDENT_AMBULATORY_CARE_PROVIDER_SITE_OTHER): Payer: Self-pay | Admitting: Orthopedic Surgery

## 2017-11-01 MED ORDER — DICLOFENAC SODIUM 75 MG PO TBEC: 75.0000 mg | DELAYED_RELEASE_TABLET | Freq: Two times a day (BID) | ORAL | 3 refills | Status: DC | PRN

## 2017-11-01 NOTE — Progress Notes (Signed)
Office Visit Note   Patient: Scott Hebert           Date of Birth: 08/14/1960           MRN: [id number] Visit Date: 10/31/2017              Requested by: No referring provider defined for this encounter. PCP: Patient, [name]  No chief complaint on file.     HPI: Patient is a 57 year old gentleman who is status post right ankle fusion about 8 months ago.  Patient is currently weightbearing in a fracture boot.  Patient states he started using some diclofenac and this is been helping extremely well for him.  Patient states that the [place] [facility] is going to provide him with his shoe with double upright braces this has not been fabricated yet.  Assessment & Plan: Visit Diagnoses:  1. H/O ankle fusion     Plan: Follow-up for evaluation of his braces and shoe wear once this is fabricated.  We will call in a prescription for diclofenac.  Follow-Up Instructions: Return in about 4 weeks (around 11/28/2017).   Ortho Exam  Patient is alert, oriented, no adenopathy, well-dressed, normal affect, normal respiratory effort. Examination patient's foot is plantigrade there is no swelling no cellulitis no drainage no signs of infection.  Patient's foot is plantigrade.  Imaging: No results found. No images are attached to the encounter.  Labs: Lab Results  Component Value Date   HGBA1C 6.8 (H) 02/23/2017   ESRSEDRATE 39 (H) 11/22/2016   ESRSEDRATE 36 (H) 10/18/2016   ESRSEDRATE 16 08/12/2016   CRP 15.7 (H) 11/22/2016   CRP 13.2 (H) 10/18/2016   CRP 6.7 08/12/2016   REPTSTATUS 06/01/2016 FINAL 05/26/2016   GRAMSTAIN  05/26/2016    ABUNDANT WBC PRESENT,BOTH PMN AND MONONUCLEAR NO ORGANISMS SEEN    CULT NO GROWTH 5 DAYS 05/26/2016   LABORGA METHICILLIN RESISTANT STAPHYLOCOCCUS AUREUS 05/26/2016     Lab Results  Component Value Date   ALBUMIN 4.6 01/17/2016    There is no height or weight on file to calculate BMI.  Orders:  No orders of the defined types were placed in this  encounter.  No orders of the defined types were placed in this encounter.    Procedures: No procedures performed  Clinical Data: No additional findings.  ROS:  All other systems negative, except as noted in the HPI. Review of Systems  Objective: Vital Signs: There were no vitals taken for this visit.  Specialty Comments:  No specialty comments available.  PMFS History: Patient Active Problem List   Diagnosis Date Noted  . Complex regional pain syndrome type 1 of right lower extremity 10/26/2016  . Foot drop, right foot 09/27/2016  . H/O ankle fusion 06/09/2016  . Acute hematogenous osteomyelitis of right fibula ([place])   . MRSA infection   . Therapeutic drug monitoring   . Phlebitis after infusion   . Septic arthritis of right ankle ([place]) 05/26/2016  . 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 Closed extra-articular fracture of distal end of right tibia   . Closed fracture of distal end of fibula, unspecified fracture morphology, initial encounter 01/17/2016  . Allergy 12/07/2011   Past Medical History:  Diagnosis Date  . Allergy   . Arthritis    "right ankle" (05/26/2016)  . Depression   . 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 GERD (gastroesophageal reflux disease)   . Headache    migraines  .  Infection    RLE; MRSA  . Pre-diabetes     Family History  Problem Relation Age of Onset  . Cancer Mother     Past Surgical History:  Procedure Laterality Date  . ANKLE ARTHROSCOPY Right 05/26/2016  . ANKLE ARTHROSCOPY Right 05/26/2016   Procedure: Irrigation and Debridement Right Fibula and Arthroscopic Debridement Right Ankle, Apply Wound VAC;  Surgeon: Newt Minion, MD;  Location: [facility];  Service: Orthopedics;  Laterality: Right;  . ANKLE FUSION Right 05/31/2016   Procedure: IRRIGATION AND DEBRIDEMENT RIGHT ANKLE WITH FUSION;  Surgeon: Newt Minion, MD;  Location: [facility];  Service: Orthopedics;  Laterality: Right;  . ANKLE FUSION Right  02/23/2017   Procedure: REVISION RIGHT ANKLE FUSION WITH STIMULAN ANTIBIOTIC BEADS;  Surgeon: Newt Minion, MD;  Location: [facility];  Service: Orthopedics;  Laterality: Right;  . APPLICATION OF WOUND VAC  05/26/2016  . FRACTURE SURGERY    . HARDWARE REMOVAL Right 03/19/2016   Procedure: HARDWARE REMOVAL RIGHT ANKLE;  Surgeon: Newt Minion, MD;  Location: [facility];  Service: Orthopedics;  Laterality: Right;  . ORIF ANKLE FRACTURE Right 01/18/2016   Procedure: OPEN REDUCTION INTERNAL FIXATION (ORIF) ANKLE FRACTURE;  Surgeon: Newt Minion, MD;  Location: [facility];  Service: Orthopedics;  Laterality: Right;  . TIBIA DEBRIDEMENT Right 05/26/2016   Social History   Occupational History  . Occupation: Psychologist, occupational: [employer]  Tobacco Use  . Smoking status: Current Every Day Smoker    Packs/day: 0.50    Years: 14.00    Pack years: 7.00    Types: Cigarettes  . Smokeless tobacco: Never Used  Substance and Sexual Activity  . Alcohol use: Yes    Comment: social  . Drug use: No  . Sexual activity: Yes    Partners: Female    Birth control/protection: None

## 2017-11-28 ENCOUNTER — Encounter (INDEPENDENT_AMBULATORY_CARE_PROVIDER_SITE_OTHER): Payer: Self-pay | Admitting: Orthopedic Surgery

## 2017-11-28 ENCOUNTER — Ambulatory Visit (INDEPENDENT_AMBULATORY_CARE_PROVIDER_SITE_OTHER): Admitting: Physician Assistant

## 2017-11-28 ENCOUNTER — Ambulatory Visit (INDEPENDENT_AMBULATORY_CARE_PROVIDER_SITE_OTHER)

## 2017-11-28 VITALS — Ht 69.0 in | Wt 246.0 lb

## 2017-11-28 DIAGNOSIS — M25571 Pain in right ankle and joints of right foot: Secondary | ICD-10-CM

## 2017-11-28 DIAGNOSIS — Z981 Arthrodesis status: Secondary | ICD-10-CM | POA: Diagnosis not present

## 2017-11-29 ENCOUNTER — Encounter (INDEPENDENT_AMBULATORY_CARE_PROVIDER_SITE_OTHER): Payer: Self-pay | Admitting: Physician Assistant

## 2017-11-29 NOTE — Progress Notes (Signed)
Office Visit Note   Patient: Scott Hebert           Date of Birth: [birth year]           MRN: [id number] Visit Date: 11/28/2017              Requested by: No referring provider defined for this encounter. PCP: Patient, [name]  Chief Complaint  Patient presents with  . Right Ankle - Follow-up    Right ankle revision f/u Xray Right ankle taken 11/28/17      HPI: The patient is a 57 yo male who is seen for follow up following revision right ankle ankle fusion with antibiotic bead placement on 02/23/17. He reports he took his prescription for the right foot custom shoe with external 1 inch lift and double upright bracing to the [facility] and they are fabricating them. He reports he is still having some pain, but it has gradually gotten better. He is using diclofenac for the pain. He has continued to wear his fracture boot until the shoes/bracing is obtained.   Assessment & Plan: Visit Diagnoses:  1. Status post ankle fusion   2. Pain in right ankle and joints of right foot     Plan: Follow up with [facility] for bracing/shoes.Follow up in 1 month after they are obtained.   Follow-Up Instructions: Return in about 4 weeks (around 12/26/2017).   Ortho Exam  Patient is alert, oriented, no adenopathy, well-dressed, normal affect, normal respiratory effort. Right foot is plantigrade. Incisions all well healed. No signs of infection or cellulitis.   Imaging: Xr Ankle Complete Right  Result Date: 11/29/2017 Good position and alignment and healing of ankle fusion with hardware in place and without signs of loosening  No images are attached to the encounter.  Labs: Lab Results  Component Value Date   HGBA1C 6.8 (H) 02/23/2017   ESRSEDRATE 39 (H) 11/22/2016   ESRSEDRATE 36 (H) 10/18/2016   ESRSEDRATE 16 08/12/2016   CRP 15.7 (H) 11/22/2016   CRP 13.2 (H) 10/18/2016   CRP 6.7 08/12/2016   REPTSTATUS 06/01/2016 FINAL 05/26/2016   GRAMSTAIN  05/26/2016   ABUNDANT WBC PRESENT,BOTH PMN AND MONONUCLEAR NO ORGANISMS SEEN    CULT NO GROWTH 5 DAYS 05/26/2016   LABORGA METHICILLIN RESISTANT STAPHYLOCOCCUS AUREUS 05/26/2016     Lab Results  Component Value Date   ALBUMIN 4.6 01/17/2016    Body mass index is 36.33 kg/m.  Orders:  Orders Placed This Encounter  Procedures  . XR Ankle Complete Right   No orders of the defined types were placed in this encounter.    Procedures: No procedures performed  Clinical Data: No additional findings.  ROS:  All other systems negative, except as noted in the HPI. Review of Systems  Objective: Vital Signs: Ht 5\' 9"  (1.753 m)   Wt 246 lb (111.6 kg)   BMI 36.33 kg/m   Specialty Comments:  No specialty comments available.  PMFS History: Patient Active Problem List   Diagnosis Date Noted  . Complex regional pain syndrome type 1 of right lower extremity 10/26/2016  . Foot drop, right foot 09/27/2016  . H/O ankle fusion 06/09/2016  . Acute hematogenous osteomyelitis of right fibula ([place])   . MRSA infection   . Therapeutic drug monitoring   . Phlebitis after infusion   . Septic arthritis of right ankle ([place]) 05/26/2016  . 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 Closed extra-articular fracture of distal  end of right tibia   . Closed fracture of distal end of fibula, unspecified fracture morphology, initial encounter 01/17/2016  . Allergy 12/07/2011   Past Medical History:  Diagnosis Date  . Allergy   . Arthritis    "right ankle" (05/26/2016)  . Depression   . 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 GERD (gastroesophageal reflux disease)   . Headache    migraines  . Infection    RLE; MRSA  . Pre-diabetes     Family History  Problem Relation Age of Onset  . Cancer Mother     Past Surgical History:  Procedure Laterality Date  . ANKLE ARTHROSCOPY Right 05/26/2016  . ANKLE ARTHROSCOPY Right 05/26/2016   Procedure: Irrigation and Debridement Right  Fibula and Arthroscopic Debridement Right Ankle, Apply Wound VAC;  Surgeon: Newt Minion, MD;  Location: [facility];  Service: Orthopedics;  Laterality: Right;  . ANKLE FUSION Right 05/31/2016   Procedure: IRRIGATION AND DEBRIDEMENT RIGHT ANKLE WITH FUSION;  Surgeon: Newt Minion, MD;  Location: [facility];  Service: Orthopedics;  Laterality: Right;  . ANKLE FUSION Right 02/23/2017   Procedure: REVISION RIGHT ANKLE FUSION WITH STIMULAN ANTIBIOTIC BEADS;  Surgeon: Newt Minion, MD;  Location: [facility];  Service: Orthopedics;  Laterality: Right;  . APPLICATION OF WOUND VAC  05/26/2016  . FRACTURE SURGERY    . HARDWARE REMOVAL Right 03/19/2016   Procedure: HARDWARE REMOVAL RIGHT ANKLE;  Surgeon: Newt Minion, MD;  Location: [facility];  Service: Orthopedics;  Laterality: Right;  . ORIF ANKLE FRACTURE Right 01/18/2016   Procedure: OPEN REDUCTION INTERNAL FIXATION (ORIF) ANKLE FRACTURE;  Surgeon: Newt Minion, MD;  Location: [facility];  Service: Orthopedics;  Laterality: Right;  . TIBIA DEBRIDEMENT Right 05/26/2016   Social History   Occupational History  . Occupation: Psychologist, occupational: [employer]  Tobacco Use  . Smoking status: Current Every Day Smoker    Packs/day: 0.50    Years: 14.00    Pack years: 7.00    Types: Cigarettes  . Smokeless tobacco: Never Used  Substance and Sexual Activity  . Alcohol use: Yes    Comment: social  . Drug use: No  . Sexual activity: Yes    Partners: Female    Birth control/protection: None

## 2017-12-28 ENCOUNTER — Other Ambulatory Visit (INDEPENDENT_AMBULATORY_CARE_PROVIDER_SITE_OTHER): Payer: Self-pay

## 2017-12-28 ENCOUNTER — Ambulatory Visit (INDEPENDENT_AMBULATORY_CARE_PROVIDER_SITE_OTHER): Admitting: Orthopedic Surgery

## 2017-12-28 ENCOUNTER — Encounter (INDEPENDENT_AMBULATORY_CARE_PROVIDER_SITE_OTHER): Payer: Self-pay | Admitting: Orthopedic Surgery

## 2017-12-28 VITALS — Ht 69.0 in | Wt 246.0 lb

## 2017-12-28 DIAGNOSIS — Z981 Arthrodesis status: Secondary | ICD-10-CM

## 2017-12-28 MED ORDER — OXYCODONE-ACETAMINOPHEN 5-325 MG PO TABS: 1.0000 | ORAL_TABLET | Freq: Three times a day (TID) | ORAL | 0 refills | Status: DC | PRN

## 2017-12-31 ENCOUNTER — Encounter (INDEPENDENT_AMBULATORY_CARE_PROVIDER_SITE_OTHER): Payer: Self-pay | Admitting: Orthopedic Surgery

## 2017-12-31 NOTE — Progress Notes (Signed)
Office Visit Note   Patient: Scott Hebert           Date of Birth: Jun 28, 1960           MRN: [id number] Visit Date: 12/28/2017              Requested by: No referring provider defined for this encounter. PCP: Patient, [name]  Chief Complaint  Patient presents with  . Right Ankle - Follow-up, Pain      HPI: Patient is a 57 year old gentleman status post debridement for infection of the right ankle and fusion.  He is currently wearing a fracture boot he did have a prescription for bracing and was waiting for this to be provided by the [place] system.  Due to delays with the [place] system patient may follow-up with [facility] for his bracing.  Assessment & Plan: Visit Diagnoses:  1. S/P ankle fusion     Plan: Patient was given a refill prescription for the Percocet.  Recommended compression stockings to help decrease the swelling.  [facility].  Follow-Up Instructions: Return in about 4 weeks (around 01/25/2018).   Ortho Exam  Patient is alert, oriented, no adenopathy, well-dressed, normal affect, normal respiratory effort. Examination patient has a mild amount of swelling in the right lower extremity his foot is plantigrade there is no redness no cellulitis no signs of infection.  There are no skin blisters or breakdown no dermatitis.  Imaging: No results found. No images are attached to the encounter.  Labs: Lab Results  Component Value Date   HGBA1C 6.8 (H) 02/23/2017   ESRSEDRATE 39 (H) 11/22/2016   ESRSEDRATE 36 (H) 10/18/2016   ESRSEDRATE 16 08/12/2016   CRP 15.7 (H) 11/22/2016   CRP 13.2 (H) 10/18/2016   CRP 6.7 08/12/2016   REPTSTATUS 06/01/2016 FINAL 05/26/2016   GRAMSTAIN  05/26/2016    ABUNDANT WBC PRESENT,BOTH PMN AND MONONUCLEAR NO ORGANISMS SEEN    CULT NO GROWTH 5 DAYS 05/26/2016   LABORGA METHICILLIN RESISTANT STAPHYLOCOCCUS AUREUS 05/26/2016     Lab Results  Component Value Date   ALBUMIN 4.6  01/17/2016    Body mass index is 36.33 kg/m.  Orders:  No orders of the defined types were placed in this encounter.  No orders of the defined types were placed in this encounter.    Procedures: No procedures performed  Clinical Data: No additional findings.  ROS:  All other systems negative, except as noted in the HPI. Review of Systems  Objective: Vital Signs: Ht 5\' 9"  (1.753 m)   Wt 246 lb (111.6 kg)   BMI 36.33 kg/m   Specialty Comments:  No specialty comments available.  PMFS History: Patient Active Problem List   Diagnosis Date Noted  . Complex regional pain syndrome type 1 of right lower extremity 10/26/2016  . Foot drop, right foot 09/27/2016  . H/O ankle fusion 06/09/2016  . Acute hematogenous osteomyelitis of right fibula ([place])   . MRSA infection   . Therapeutic drug monitoring   . Phlebitis after infusion   . Septic arthritis of right ankle ([place]) 05/26/2016  . 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 Closed extra-articular fracture of distal end of right tibia   . Closed fracture of distal end of fibula, unspecified fracture morphology, initial encounter 01/17/2016  . Allergy 12/07/2011   Past Medical History:  Diagnosis Date  . Allergy   . Arthritis    "right ankle" (05/26/2016)  .  Depression   . 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 GERD (gastroesophageal reflux disease)   . Headache    migraines  . Infection    RLE; MRSA  . Pre-diabetes     Family History  Problem Relation Age of Onset  . Cancer Mother     Past Surgical History:  Procedure Laterality Date  . ANKLE ARTHROSCOPY Right 05/26/2016  . ANKLE ARTHROSCOPY Right 05/26/2016   Procedure: Irrigation and Debridement Right Fibula and Arthroscopic Debridement Right Ankle, Apply Wound VAC;  Surgeon: Newt Minion, MD;  Location: [place];  Service: Orthopedics;  Laterality: Right;  . ANKLE FUSION Right 05/31/2016   Procedure: IRRIGATION AND DEBRIDEMENT RIGHT  ANKLE WITH FUSION;  Surgeon: Newt Minion, MD;  Location: [place];  Service: Orthopedics;  Laterality: Right;  . ANKLE FUSION Right 02/23/2017   Procedure: REVISION RIGHT ANKLE FUSION WITH STIMULAN ANTIBIOTIC BEADS;  Surgeon: Newt Minion, MD;  Location: [place];  Service: Orthopedics;  Laterality: Right;  . APPLICATION OF WOUND VAC  05/26/2016  . FRACTURE SURGERY    . HARDWARE REMOVAL Right 03/19/2016   Procedure: HARDWARE REMOVAL RIGHT ANKLE;  Surgeon: Newt Minion, MD;  Location: [place];  Service: Orthopedics;  Laterality: Right;  . ORIF ANKLE FRACTURE Right 01/18/2016   Procedure: OPEN REDUCTION INTERNAL FIXATION (ORIF) ANKLE FRACTURE;  Surgeon: Newt Minion, MD;  Location: [place];  Service: Orthopedics;  Laterality: Right;  . TIBIA DEBRIDEMENT Right 05/26/2016   Social History   Occupational History  . Occupation: Psychologist, occupational: [employer]  Tobacco Use  . Smoking status: Current Every Day Smoker    Packs/day: 0.50    Years: 14.00    Pack years: 7.00    Types: Cigarettes  . Smokeless tobacco: Never Used  Substance and Sexual Activity  . Alcohol use: Yes    Comment: social  . Drug use: No  . Sexual activity: Yes    Partners: Female    Birth control/protection: None

## 2018-01-25 ENCOUNTER — Ambulatory Visit (INDEPENDENT_AMBULATORY_CARE_PROVIDER_SITE_OTHER): Admitting: Family

## 2018-01-25 ENCOUNTER — Ambulatory Visit (INDEPENDENT_AMBULATORY_CARE_PROVIDER_SITE_OTHER)

## 2018-01-25 ENCOUNTER — Encounter (INDEPENDENT_AMBULATORY_CARE_PROVIDER_SITE_OTHER): Payer: Self-pay | Admitting: Family

## 2018-01-25 ENCOUNTER — Ambulatory Visit (INDEPENDENT_AMBULATORY_CARE_PROVIDER_SITE_OTHER): Admitting: Orthopedic Surgery

## 2018-01-25 VITALS — Ht 69.0 in | Wt 246.0 lb

## 2018-01-25 DIAGNOSIS — Z981 Arthrodesis status: Secondary | ICD-10-CM | POA: Diagnosis not present

## 2018-01-25 MED ORDER — OXYCODONE-ACETAMINOPHEN 5-325 MG PO TABS: 1.0000 | ORAL_TABLET | Freq: Three times a day (TID) | ORAL | 0 refills | Status: DC | PRN

## 2018-01-25 NOTE — Progress Notes (Signed)
Office Visit Note   Patient: Scott Hebert           Date of Birth: 07-Nov-1960           MRN: [id number] Visit Date: 01/25/2018              Requested by: No referring provider defined for this encounter. PCP: Patient, [name]  Chief Complaint  Patient presents with  . Right Ankle - Follow-up      HPI: Patient is a 58 year old gentleman status post debridement for infection of the right ankle and fusion.  He is currently wearing a fracture boot. He does have a prescription for bracing and is currently shopping for a shoe for his bracewear. Today complains of some popping in his ankle with ambulation. No associated pain. some aching pain with prolong standing or ambulation. Overall feels is improving. Would like to stay in care until has transitioned to regular shoewear.   Assessment & Plan: Visit Diagnoses:  1. H/O ankle fusion     Plan: Patient was given a refill prescription for the Percocet.  Recommended following up with [facility] for bracing.  Follow-Up Instructions: Return in about 29 days (around 02/23/2018).   Ortho Exam  Patient is alert, oriented, no adenopathy, well-dressed, normal affect, normal respiratory effort. Examination patient has a mild amount of swelling in the right lower extremity his foot is plantigrade there is no redness no cellulitis no signs of infection.  There are no skin blisters or breakdown no dermatitis.  Imaging: No results found. No images are attached to the encounter.  Labs: Lab Results  Component Value Date   HGBA1C 6.8 (H) 02/23/2017   ESRSEDRATE 39 (H) 11/22/2016   ESRSEDRATE 36 (H) 10/18/2016   ESRSEDRATE 16 08/12/2016   CRP 15.7 (H) 11/22/2016   CRP 13.2 (H) 10/18/2016   CRP 6.7 08/12/2016   REPTSTATUS 06/01/2016 FINAL 05/26/2016   GRAMSTAIN  05/26/2016    ABUNDANT WBC PRESENT,BOTH PMN AND MONONUCLEAR NO ORGANISMS SEEN    CULT NO GROWTH 5 DAYS 05/26/2016   LABORGA METHICILLIN RESISTANT STAPHYLOCOCCUS AUREUS  05/26/2016     Lab Results  Component Value Date   ALBUMIN 4.6 01/17/2016    Body mass index is 36.33 kg/m.  Orders:  Orders Placed This Encounter  Procedures  . XR Ankle Complete Right   Meds ordered this encounter  Medications  . oxyCODONE-acetaminophen (PERCOCET/ROXICET) 5-325 MG tablet    Sig: Take 1 tablet by mouth every 8 (eight) hours as needed.    Dispense:  20 tablet    Refill:  0     Procedures: No procedures performed  Clinical Data: No additional findings.  ROS:  All other systems negative, except as noted in the HPI. Review of Systems  Musculoskeletal: Positive for arthralgias. Negative for joint swelling.    Objective: Vital Signs: Ht 5\' 9"  (1.753 m)   Wt 246 lb (111.6 kg)   BMI 36.33 kg/m   Specialty Comments:  No specialty comments available.  PMFS History: Patient Active Problem List   Diagnosis Date Noted  . Complex regional pain syndrome type 1 of right lower extremity 10/26/2016  . Foot drop, right foot 09/27/2016  . H/O ankle fusion 06/09/2016  . Acute hematogenous osteomyelitis of right fibula ([place])   . MRSA infection   . Therapeutic drug monitoring   . Phlebitis after infusion   . Septic arthritis of right ankle ([place]) 05/26/2016  . 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  Closed extra-articular fracture of distal end of right tibia   . Closed fracture of distal end of fibula, unspecified fracture morphology, initial encounter 01/17/2016  . Allergy 12/07/2011   Past Medical History:  Diagnosis Date  . Allergy   . Arthritis    "right ankle" (05/26/2016)  . Depression   . 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 GERD (gastroesophageal reflux disease)   . Headache    migraines  . Infection    RLE; MRSA  . Pre-diabetes     Family History  Problem Relation Age of Onset  . Cancer Mother     Past Surgical History:  Procedure Laterality Date  . ANKLE ARTHROSCOPY Right 05/26/2016  . ANKLE  ARTHROSCOPY Right 05/26/2016   Procedure: Irrigation and Debridement Right Fibula and Arthroscopic Debridement Right Ankle, Apply Wound VAC;  Surgeon: Newt Minion, MD;  Location: [place];  Service: Orthopedics;  Laterality: Right;  . ANKLE FUSION Right 05/31/2016   Procedure: IRRIGATION AND DEBRIDEMENT RIGHT ANKLE WITH FUSION;  Surgeon: Newt Minion, MD;  Location: [place];  Service: Orthopedics;  Laterality: Right;  . ANKLE FUSION Right 02/23/2017   Procedure: REVISION RIGHT ANKLE FUSION WITH STIMULAN ANTIBIOTIC BEADS;  Surgeon: Newt Minion, MD;  Location: [place];  Service: Orthopedics;  Laterality: Right;  . APPLICATION OF WOUND VAC  05/26/2016  . FRACTURE SURGERY    . HARDWARE REMOVAL Right 03/19/2016   Procedure: HARDWARE REMOVAL RIGHT ANKLE;  Surgeon: Newt Minion, MD;  Location: [place];  Service: Orthopedics;  Laterality: Right;  . ORIF ANKLE FRACTURE Right 01/18/2016   Procedure: OPEN REDUCTION INTERNAL FIXATION (ORIF) ANKLE FRACTURE;  Surgeon: Newt Minion, MD;  Location: [place];  Service: Orthopedics;  Laterality: Right;  . TIBIA DEBRIDEMENT Right 05/26/2016   Social History   Occupational History  . Occupation: Psychologist, occupational: [employer]  Tobacco Use  . Smoking status: Current Every Day Smoker    Packs/day: 0.50    Years: 14.00    Pack years: 7.00    Types: Cigarettes  . Smokeless tobacco: Never Used  Substance and Sexual Activity  . Alcohol use: Yes    Comment: social  . Drug use: No  . Sexual activity: Yes    Partners: Female    Birth control/protection: None

## 2018-02-10 ENCOUNTER — Telehealth (INDEPENDENT_AMBULATORY_CARE_PROVIDER_SITE_OTHER): Payer: Self-pay | Admitting: Orthopedic Surgery

## 2018-02-10 NOTE — Telephone Encounter (Signed)
Received a call from the [facility] stating they received my fax that states that did not records within the requested dates. She stated she was requesting the available records. I faxed records [phone]

## 2018-02-27 ENCOUNTER — Encounter (INDEPENDENT_AMBULATORY_CARE_PROVIDER_SITE_OTHER): Payer: Self-pay | Admitting: Physician Assistant

## 2018-02-27 ENCOUNTER — Encounter (INDEPENDENT_AMBULATORY_CARE_PROVIDER_SITE_OTHER): Payer: Self-pay | Admitting: Orthopedic Surgery

## 2018-02-27 ENCOUNTER — Ambulatory Visit (INDEPENDENT_AMBULATORY_CARE_PROVIDER_SITE_OTHER): Admitting: Physician Assistant

## 2018-02-27 VITALS — Ht 69.0 in | Wt 246.0 lb

## 2018-02-27 DIAGNOSIS — Z981 Arthrodesis status: Secondary | ICD-10-CM

## 2018-02-27 DIAGNOSIS — M25571 Pain in right ankle and joints of right foot: Secondary | ICD-10-CM | POA: Diagnosis not present

## 2018-02-27 NOTE — Progress Notes (Signed)
Office Visit Note   Patient: Scott Hebert           Date of Birth: April 29, 1960           MRN: [id number] Visit Date: 02/27/2018              Requested by: No referring provider defined for this encounter. PCP: Patient, [name]  Chief Complaint  Patient presents with  . Right Ankle - Follow-up    S/p revision right ankle fusion 02/23/17      HPI: The patient is a 58 year old gentleman who is status post debridement for infection of right ankle and fusion.  He obtained double upright bracing from [facility] approximately 2 weeks ago.  He feels like the bracing is helping some.  He is having some pain above the joint over the distal tibia and Dr. Sharol Given discussed with the patient that this is likely muscular pain in the anterior compartment due to disuse and this should get better over time.  He does feel like this is helping some overall.  Assessment & Plan: Visit Diagnoses:  1. S/P ankle fusion   2. Pain in right ankle and joints of right foot     Plan: Patient will continue to work on resistive exercises as instructed.  Continue double upright bracing as fabricated by [facility].  He should remain out of work for at least the next 4 weeks and we will see him back again in 4 more weeks for follow-up.  Follow-Up Instructions: Return in about 4 weeks (around 03/27/2018).   Ortho Exam  Patient is alert, oriented, no adenopathy, well-dressed, normal affect, normal respiratory effort. Examination of the right ankle shows mild localized edema.  There are no signs of cellulitis or infection currently.  No evidence for skin breakdown.  Does complain of pain over the distal tibia with weightbearing.  He is not getting any skin irritation or breakdown from his bracing.  Imaging: No results found. No images are attached to the encounter.  Labs: Lab Results  Component Value Date   HGBA1C 6.8 (H) 02/23/2017   ESRSEDRATE 39 (H) 11/22/2016   ESRSEDRATE 36 (H) 10/18/2016   ESRSEDRATE 16 08/12/2016   CRP 15.7 (H) 11/22/2016   CRP 13.2 (H) 10/18/2016   CRP 6.7 08/12/2016   REPTSTATUS 06/01/2016 FINAL 05/26/2016   GRAMSTAIN  05/26/2016    ABUNDANT WBC PRESENT,BOTH PMN AND MONONUCLEAR NO ORGANISMS SEEN    CULT NO GROWTH 5 DAYS 05/26/2016   LABORGA METHICILLIN RESISTANT STAPHYLOCOCCUS AUREUS 05/26/2016     Lab Results  Component Value Date   ALBUMIN 4.6 01/17/2016    Body mass index is 36.33 kg/m.  Orders:  No orders of the defined types were placed in this encounter.  No orders of the defined types were placed in this encounter.    Procedures: No procedures performed  Clinical Data: No additional findings.  ROS:  All other systems negative, except as noted in the HPI. Review of Systems  Objective: Vital Signs: Ht 5\' 9"  (1.753 m)   Wt 246 lb (111.6 kg)   BMI 36.33 kg/m   Specialty Comments:  No specialty comments available.  PMFS History: Patient Active Problem List   Diagnosis Date Noted  . Complex regional pain syndrome type 1 of right lower extremity 10/26/2016  . Foot drop, right foot 09/27/2016  . H/O ankle fusion 06/09/2016  . Acute hematogenous osteomyelitis of right fibula ([place])   . MRSA infection   . Therapeutic  drug monitoring   . Phlebitis after infusion   . Septic arthritis of right ankle ([place]) 05/26/2016  . 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 Closed extra-articular fracture of distal end of right tibia   . Closed fracture of distal end of fibula, unspecified fracture morphology, initial encounter 01/17/2016  . Allergy 12/07/2011   Past Medical History:  Diagnosis Date  . Allergy   . Arthritis    "right ankle" (05/26/2016)  . Depression   . 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 GERD (gastroesophageal reflux disease)   . Headache    migraines  . Infection    RLE; MRSA  . Pre-diabetes     Family History  Problem Relation Age of Onset  . Cancer Mother     Past Surgical  History:  Procedure Laterality Date  . ANKLE ARTHROSCOPY Right 05/26/2016  . ANKLE ARTHROSCOPY Right 05/26/2016   Procedure: Irrigation and Debridement Right Fibula and Arthroscopic Debridement Right Ankle, Apply Wound VAC;  Surgeon: Newt Minion, MD;  Location: [place];  Service: Orthopedics;  Laterality: Right;  . ANKLE FUSION Right 05/31/2016   Procedure: IRRIGATION AND DEBRIDEMENT RIGHT ANKLE WITH FUSION;  Surgeon: Newt Minion, MD;  Location: [place];  Service: Orthopedics;  Laterality: Right;  . ANKLE FUSION Right 02/23/2017   Procedure: REVISION RIGHT ANKLE FUSION WITH STIMULAN ANTIBIOTIC BEADS;  Surgeon: Newt Minion, MD;  Location: [place];  Service: Orthopedics;  Laterality: Right;  . APPLICATION OF WOUND VAC  05/26/2016  . FRACTURE SURGERY    . HARDWARE REMOVAL Right 03/19/2016   Procedure: HARDWARE REMOVAL RIGHT ANKLE;  Surgeon: Newt Minion, MD;  Location: [place];  Service: Orthopedics;  Laterality: Right;  . ORIF ANKLE FRACTURE Right 01/18/2016   Procedure: OPEN REDUCTION INTERNAL FIXATION (ORIF) ANKLE FRACTURE;  Surgeon: Newt Minion, MD;  Location: [place];  Service: Orthopedics;  Laterality: Right;  . TIBIA DEBRIDEMENT Right 05/26/2016   Social History   Occupational History  . Occupation: Psychologist, occupational: [employer]  Tobacco Use  . Smoking status: Current Every Day Smoker    Packs/day: 0.50    Years: 14.00    Pack years: 7.00    Types: Cigarettes  . Smokeless tobacco: Never Used  Substance and Sexual Activity  . Alcohol use: Yes    Comment: social  . Drug use: No  . Sexual activity: Yes    Partners: Female    Birth control/protection: None

## 2018-03-02 ENCOUNTER — Other Ambulatory Visit (INDEPENDENT_AMBULATORY_CARE_PROVIDER_SITE_OTHER): Payer: Self-pay | Admitting: Orthopedic Surgery

## 2018-03-28 ENCOUNTER — Ambulatory Visit (INDEPENDENT_AMBULATORY_CARE_PROVIDER_SITE_OTHER): Admitting: Orthopedic Surgery

## 2018-03-28 ENCOUNTER — Other Ambulatory Visit: Payer: Self-pay

## 2018-03-28 ENCOUNTER — Encounter (INDEPENDENT_AMBULATORY_CARE_PROVIDER_SITE_OTHER): Payer: Self-pay | Admitting: Orthopedic Surgery

## 2018-03-28 VITALS — Ht 69.0 in | Wt 246.0 lb

## 2018-03-28 DIAGNOSIS — Z981 Arthrodesis status: Secondary | ICD-10-CM | POA: Diagnosis not present

## 2018-03-28 MED ORDER — TRAMADOL HCL 50 MG PO TABS: 50.0000 mg | ORAL_TABLET | Freq: Four times a day (QID) | ORAL | 0 refills | Status: DC | PRN

## 2018-03-28 NOTE — Progress Notes (Signed)
Office Visit Note   Patient: Scott Hebert           Date of Birth: 1960-07-27           MRN: [id number] Visit Date: 03/28/2018              Requested by: No referring provider defined for this encounter. PCP: Patient, [name]  Chief Complaint  Patient presents with  . Right Ankle - Follow-up    02/23/17 right ankle revision fusion       HPI: Patient is a 58 year old gentleman is status post tibial calcaneal fusion.  Patient did obtain a double upright brace from [facility] he states that he has some stiff soled shoes ordered but is currently wearing these and is soft athletic sneakers he does have a heel lift.  Patient states he is trying to walk about [phone] steps a day.  He states that after he does this he is in bed all day.  Complains of midfoot pain as well as medial lateral ankle pain.  Assessment & Plan: Visit Diagnoses:  1. S/P ankle fusion     Plan: Patient was given a note stating that he still out of work he is given a prescription for tramadol and he is given a prescription for [facility] for carbon plate to put in his shoe to unload pressure through the midfoot and forefoot.  Reevaluate in 3 weeks  3 view radiographs of the right ankle at follow-up.  Follow-Up Instructions: Return in about 3 weeks (around 04/18/2018).   Ortho Exam  Patient is alert, oriented, no adenopathy, well-dressed, normal affect, normal respiratory effort. Examination patient has good pulses his foot is plantigrade ankle is neutral.  There is no redness no cellulitis no signs of infection.  He is tender globally around the foot and ankle.  Imaging: No results found. No images are attached to the encounter.  Labs: Lab Results  Component Value Date   HGBA1C 6.8 (H) 02/23/2017   ESRSEDRATE 39 (H) 11/22/2016   ESRSEDRATE 36 (H) 10/18/2016   ESRSEDRATE 16 08/12/2016   CRP 15.7 (H) 11/22/2016   CRP 13.2 (H) 10/18/2016   CRP 6.7 08/12/2016   REPTSTATUS 06/01/2016 FINAL 05/26/2016   GRAMSTAIN  05/26/2016    ABUNDANT WBC PRESENT,BOTH PMN AND MONONUCLEAR NO ORGANISMS SEEN    CULT NO GROWTH 5 DAYS 05/26/2016   LABORGA METHICILLIN RESISTANT STAPHYLOCOCCUS AUREUS 05/26/2016     Lab Results  Component Value Date   ALBUMIN 4.6 01/17/2016    Body mass index is 36.33 kg/m.  Orders:  No orders of the defined types were placed in this encounter.  Meds ordered this encounter  Medications  . traMADol (ULTRAM) 50 MG tablet    Sig: Take 1 tablet (50 mg total) by mouth every 6 (six) hours as needed for moderate pain.    Dispense:  30 tablet    Refill:  0     Procedures: No procedures performed  Clinical Data: No additional findings.  ROS:  All other systems negative, except as noted in the HPI. Review of Systems  Objective: Vital Signs: Ht 5\' 9"  (1.753 m)   Wt 246 lb (111.6 kg)   BMI 36.33 kg/m   Specialty Comments:  No specialty comments available.  PMFS History: Patient Active Problem List   Diagnosis Date Noted  . Complex regional pain syndrome type 1 of right lower extremity 10/26/2016  . Foot drop, right foot 09/27/2016  . H/O ankle fusion 06/09/2016  .  Acute hematogenous osteomyelitis of right fibula ([place])   . MRSA infection   . Therapeutic drug monitoring   . Phlebitis after infusion   . Septic arthritis of right ankle ([place]) 05/26/2016  . 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 Closed extra-articular fracture of distal end of right tibia   . Closed fracture of distal end of fibula, unspecified fracture morphology, initial encounter 01/17/2016  . Allergy 12/07/2011   Past Medical History:  Diagnosis Date  . Allergy   . Arthritis    "right ankle" (05/26/2016)  . Depression   . 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 GERD (gastroesophageal reflux disease)   . Headache    migraines  . Infection    RLE; MRSA  . Pre-diabetes     Family History  Problem Relation Age of Onset  . Cancer Mother     Past  Surgical History:  Procedure Laterality Date  . ANKLE ARTHROSCOPY Right 05/26/2016  . ANKLE ARTHROSCOPY Right 05/26/2016   Procedure: Irrigation and Debridement Right Fibula and Arthroscopic Debridement Right Ankle, Apply Wound VAC;  Surgeon: Newt Minion, MD;  Location: [place];  Service: Orthopedics;  Laterality: Right;  . ANKLE FUSION Right 05/31/2016   Procedure: IRRIGATION AND DEBRIDEMENT RIGHT ANKLE WITH FUSION;  Surgeon: Newt Minion, MD;  Location: [place];  Service: Orthopedics;  Laterality: Right;  . ANKLE FUSION Right 02/23/2017   Procedure: REVISION RIGHT ANKLE FUSION WITH STIMULAN ANTIBIOTIC BEADS;  Surgeon: Newt Minion, MD;  Location: [place];  Service: Orthopedics;  Laterality: Right;  . APPLICATION OF WOUND VAC  05/26/2016  . FRACTURE SURGERY    . HARDWARE REMOVAL Right 03/19/2016   Procedure: HARDWARE REMOVAL RIGHT ANKLE;  Surgeon: Newt Minion, MD;  Location: [place];  Service: Orthopedics;  Laterality: Right;  . ORIF ANKLE FRACTURE Right 01/18/2016   Procedure: OPEN REDUCTION INTERNAL FIXATION (ORIF) ANKLE FRACTURE;  Surgeon: Newt Minion, MD;  Location: [place];  Service: Orthopedics;  Laterality: Right;  . TIBIA DEBRIDEMENT Right 05/26/2016   Social History   Occupational History  . Occupation: Psychologist, occupational: [employer]  Tobacco Use  . Smoking status: Current Every Day Smoker    Packs/day: 0.50    Years: 14.00    Pack years: 7.00    Types: Cigarettes  . Smokeless tobacco: Never Used  Substance and Sexual Activity  . Alcohol use: Yes    Comment: social  . Drug use: No  . Sexual activity: Yes    Partners: Female    Birth control/protection: None

## 2018-04-18 ENCOUNTER — Ambulatory Visit (INDEPENDENT_AMBULATORY_CARE_PROVIDER_SITE_OTHER): Admitting: Orthopedic Surgery

## 2018-05-11 ENCOUNTER — Other Ambulatory Visit (INDEPENDENT_AMBULATORY_CARE_PROVIDER_SITE_OTHER): Payer: Self-pay

## 2018-05-11 ENCOUNTER — Other Ambulatory Visit (INDEPENDENT_AMBULATORY_CARE_PROVIDER_SITE_OTHER): Payer: Self-pay | Admitting: Orthopedic Surgery

## 2018-05-12 MED ORDER — TRAMADOL HCL 50 MG PO TABS: 50.0000 mg | ORAL_TABLET | Freq: Four times a day (QID) | ORAL | 0 refills | Status: DC | PRN

## 2018-05-12 NOTE — Telephone Encounter (Signed)
I think this is a [place] patient

## 2018-05-12 NOTE — Telephone Encounter (Signed)
Please advise Scott Hebert, thanks.

## 2018-05-12 NOTE — Telephone Encounter (Signed)
Patient was called and informed.

## 2018-05-12 NOTE — Telephone Encounter (Signed)
Dr Sharol Given pt last refill 03/28/2018 for Qty #30, please advise.

## 2018-05-15 IMAGING — CR DG ANKLE COMPLETE 3+V*R*
3 series · 3 of 3 positions shown · non-contrast
Comparison: None.

CLINICAL DATA: Pain after trauma.

EXAM:
RIGHT ANKLE - COMPLETE 3+ VIEW

[x ankle ap right]
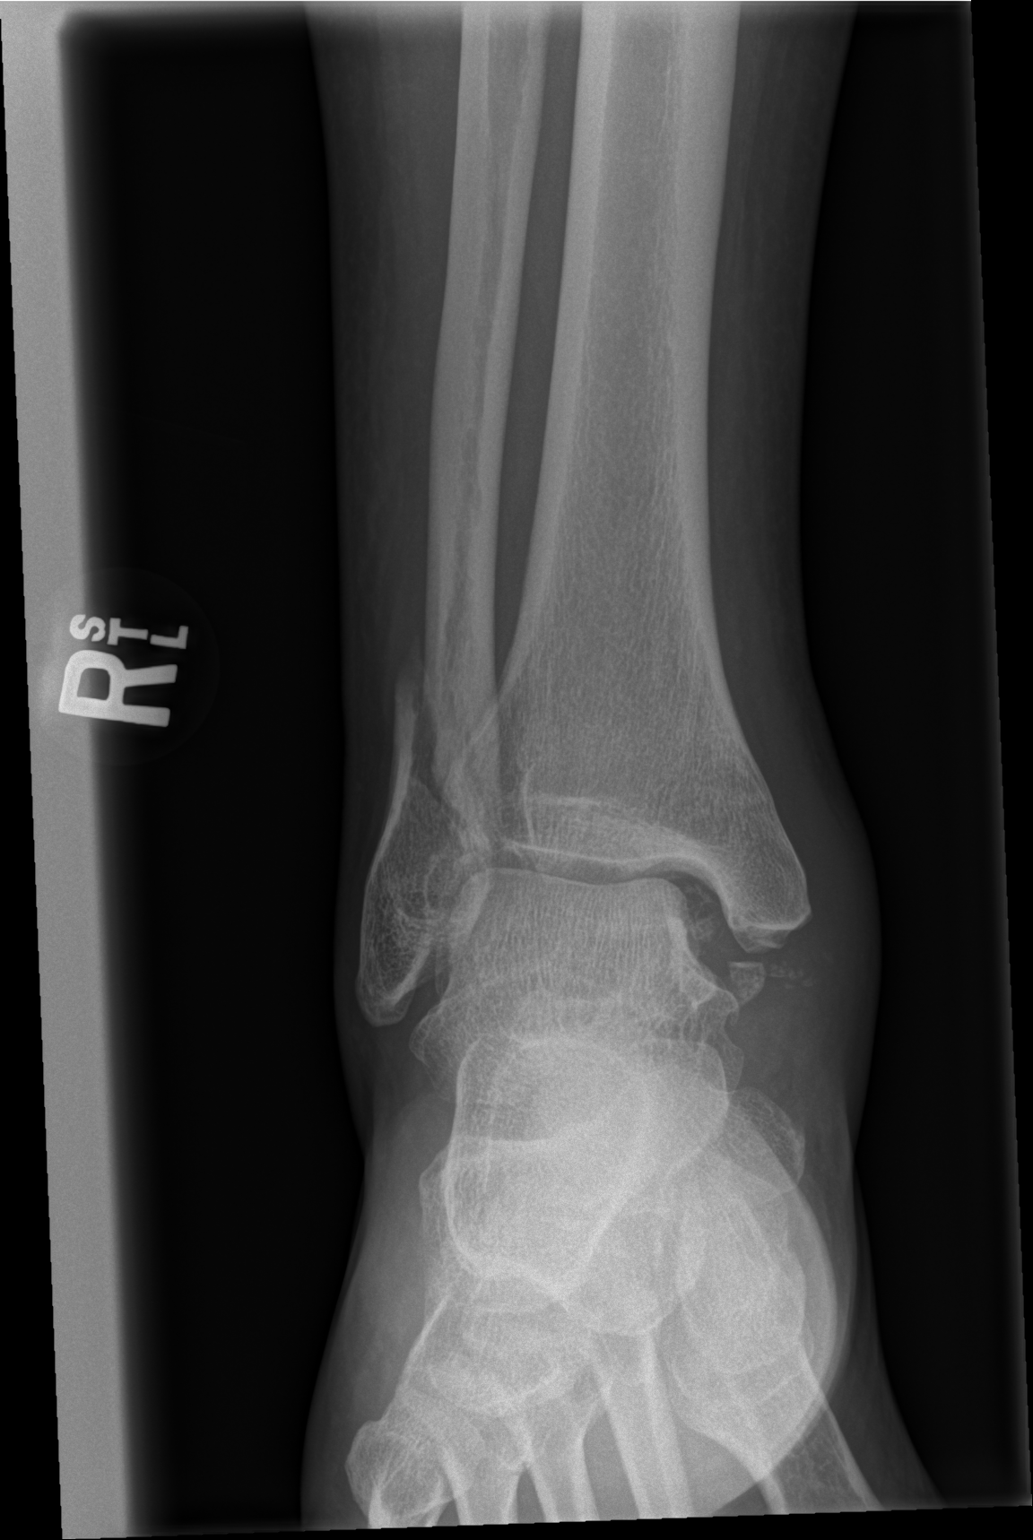

[x ankle obl right]
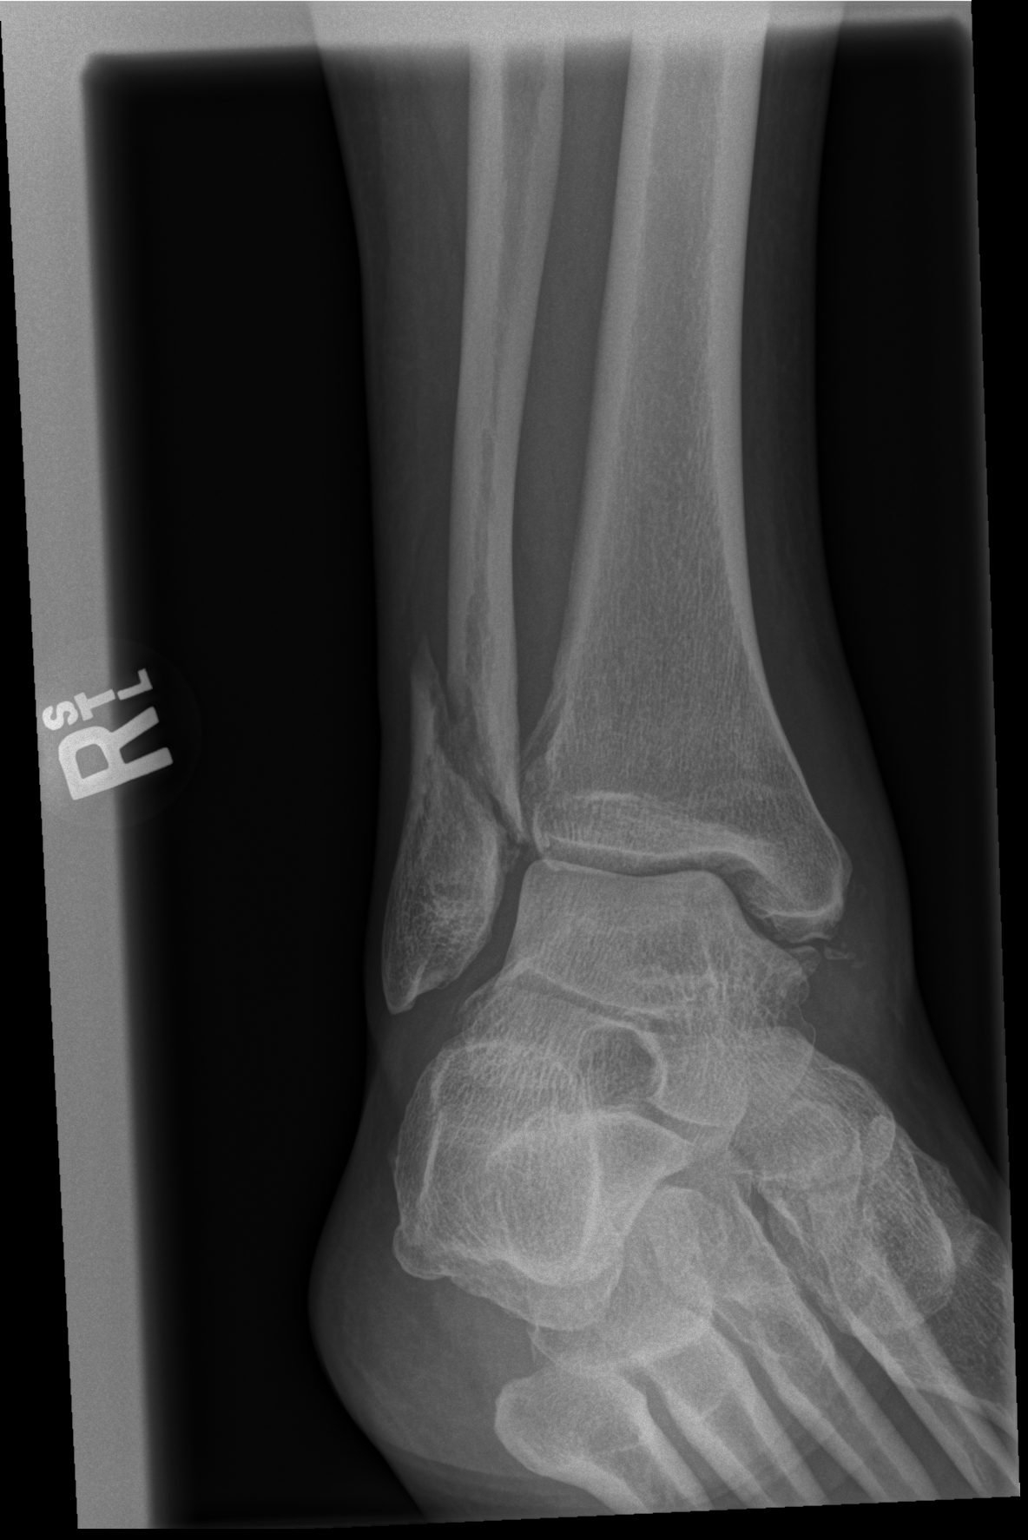

[x ankle lat right]
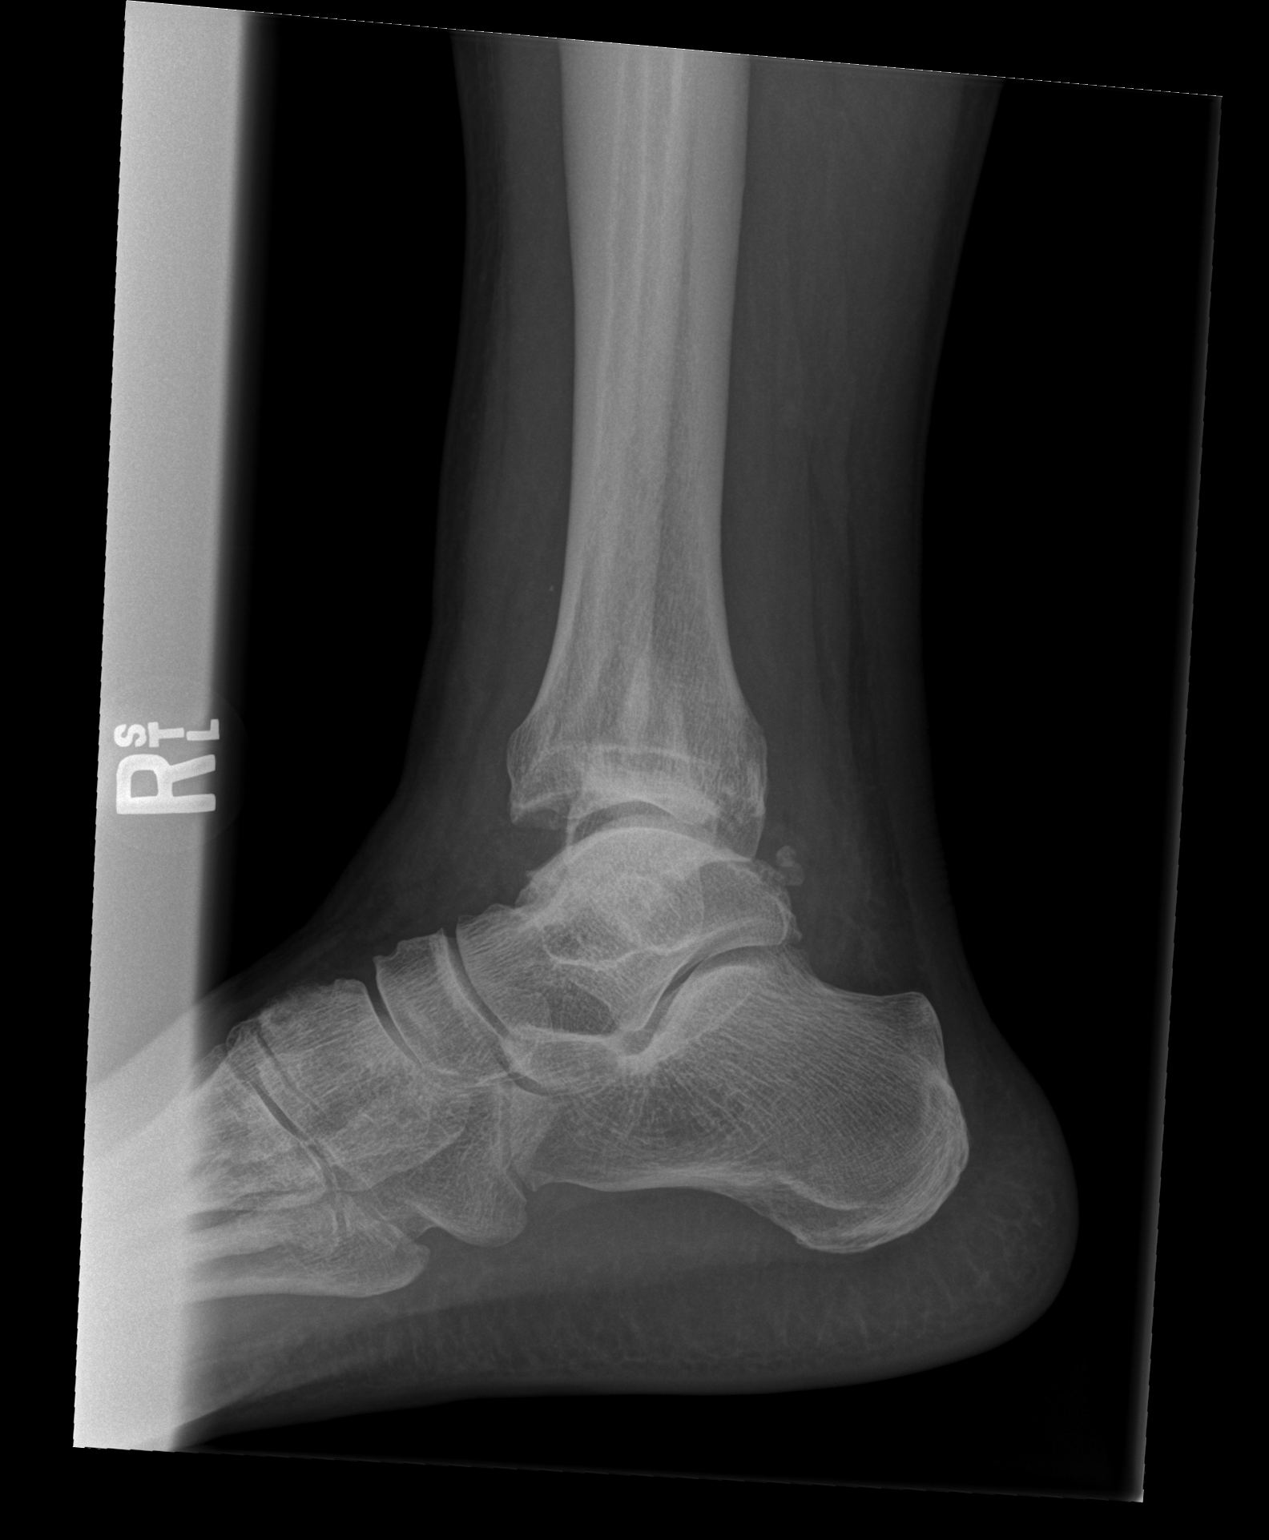

[3 of 3 positions shown; findings below may reference images not displayed]

FINDINGS: There is a displaced fracture through the distal fibula. There may
be a subtle fracture through the lateral aspect of the tibia as seen
on oblique imaging. Calcifications are seen adjacent to the medial
malleolus is well. There is significant soft tissue swelling,
particularly medially. There is widening of the ankle mortise
medially as well. On the lateral view, the tibia is displaced
anteriorly relative to the calcaneus. No other acute abnormalities
identified.
IMPRESSION: 1. Distal left fibular fracture.
2. Suspected fracture through the lateral aspect of the distal tibia
on oblique imaging only.
3. Disruption of the ankle mortise with widening medially and
anteriorly. The tibia is mildly displaced anteriorly relative to the
talus.
4. Calcifications adjacent to the medial malleolus. While some of
these may be chronic, acute fracture fragments are not completely
excluded. Soft tissue swelling medially.

## 2018-05-17 ENCOUNTER — Other Ambulatory Visit (INDEPENDENT_AMBULATORY_CARE_PROVIDER_SITE_OTHER): Payer: Self-pay | Admitting: Family

## 2018-05-17 MED ORDER — OXYCODONE-ACETAMINOPHEN 5-325 MG PO TABS: 1.0000 | ORAL_TABLET | Freq: Three times a day (TID) | ORAL | 0 refills | Status: DC | PRN

## 2018-05-18 ENCOUNTER — Encounter: Payer: Self-pay | Admitting: Orthopedic Surgery

## 2018-05-18 ENCOUNTER — Ambulatory Visit: Payer: Self-pay

## 2018-05-18 ENCOUNTER — Ambulatory Visit (INDEPENDENT_AMBULATORY_CARE_PROVIDER_SITE_OTHER): Admitting: Orthopedic Surgery

## 2018-05-18 ENCOUNTER — Other Ambulatory Visit: Payer: Self-pay

## 2018-05-18 VITALS — Ht 69.0 in | Wt 246.0 lb

## 2018-05-18 DIAGNOSIS — Z981 Arthrodesis status: Secondary | ICD-10-CM

## 2018-05-21 ENCOUNTER — Encounter: Payer: Self-pay | Admitting: Orthopedic Surgery

## 2018-05-21 NOTE — Progress Notes (Signed)
Office Visit Note   Patient: Scott Hebert           Date of Birth: [birth year]           MRN: [id number] Visit Date: 05/18/2018              Requested by: No referring provider defined for this encounter. PCP: Patient, [name]  Chief Complaint  Patient presents with  . Right Ankle - Pain, Follow-up    02/23/2017 revision      HPI: The patient is a 58 year old gentleman who is seen status post right ankle tibial calcaneal revision fusion on 02/23/2017.  He has been wearing double upright bracing with insert.  He reports that he is doing fairly well wearing his bracing.  He has been wearing a compression sock.  He is trying to increase his activity as much as he can tolerate.  Assessment & Plan: Visit Diagnoses:  1. S/P ankle fusion     Plan: Continue double upright bracing with custom insert continue compression sock.  Follow-up in 4 weeks.  Follow-Up Instructions: Return in about 4 weeks (around 06/15/2018).   Ortho Exam  Patient is alert, oriented, no adenopathy, well-dressed, normal affect, normal respiratory effort. Patient ambulates with double upright bracing with minimal antalgic appearing gait.  The incisions are well-healed.  He has palpable pedal pulses.  There is no signs of infection or cellulitis of the foot or ankle.  He remains somewhat tender around the dorsum of the foot and ankle but reports that it is less so than previous exams. Right ankle complete radiographs show stable fixation with no migration of hardware Imaging: No results found. No images are attached to the encounter.  Labs: Lab Results  Component Value Date   HGBA1C 6.8 (H) 02/23/2017   ESRSEDRATE 39 (H) 11/22/2016   ESRSEDRATE 36 (H) 10/18/2016   ESRSEDRATE 16 08/12/2016   CRP 15.7 (H) 11/22/2016   CRP 13.2 (H) 10/18/2016   CRP 6.7 08/12/2016   REPTSTATUS 06/01/2016 FINAL 05/26/2016   GRAMSTAIN  05/26/2016    ABUNDANT WBC PRESENT,BOTH PMN AND MONONUCLEAR NO ORGANISMS SEEN    CULT  NO GROWTH 5 DAYS 05/26/2016   LABORGA METHICILLIN RESISTANT STAPHYLOCOCCUS AUREUS 05/26/2016     Lab Results  Component Value Date   ALBUMIN 4.6 01/17/2016    Body mass index is 36.33 kg/m.  Orders:  Orders Placed This Encounter  Procedures  . XR Ankle Complete Right   No orders of the defined types were placed in this encounter.    Procedures: No procedures performed  Clinical Data: No additional findings.  ROS:  All other systems negative, except as noted in the HPI. Review of Systems  Objective: Vital Signs: Ht 5\' 9"  (1.753 m)   Wt 246 lb (111.6 kg)   BMI 36.33 kg/m   Specialty Comments:  No specialty comments available.  PMFS History: Patient Active Problem List   Diagnosis Date Noted  . Complex regional pain syndrome type 1 of right lower extremity 10/26/2016  . Foot drop, right foot 09/27/2016  . H/O ankle fusion 06/09/2016  . Acute hematogenous osteomyelitis of right fibula ([place])   . MRSA infection   . Therapeutic drug monitoring   . Phlebitis after infusion   . Septic arthritis of right ankle ([place]) 05/26/2016  . 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 Closed extra-articular fracture of distal end of right tibia   . Closed fracture of distal end of fibula, unspecified fracture  morphology, initial encounter 01/17/2016  . Allergy 12/07/2011   Past Medical History:  Diagnosis Date  . Allergy   . Arthritis    "right ankle" (05/26/2016)  . Depression   . 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 GERD (gastroesophageal reflux disease)   . Headache    migraines  . Infection    RLE; MRSA  . Pre-diabetes     Family History  Problem Relation Age of Onset  . Cancer Mother     Past Surgical History:  Procedure Laterality Date  . ANKLE ARTHROSCOPY Right 05/26/2016  . ANKLE ARTHROSCOPY Right 05/26/2016   Procedure: Irrigation and Debridement Right Fibula and Arthroscopic Debridement Right Ankle, Apply Wound VAC;  Surgeon:  Newt Minion, MD;  Location: [facility];  Service: Orthopedics;  Laterality: Right;  . ANKLE FUSION Right 05/31/2016   Procedure: IRRIGATION AND DEBRIDEMENT RIGHT ANKLE WITH FUSION;  Surgeon: Newt Minion, MD;  Location: [facility];  Service: Orthopedics;  Laterality: Right;  . ANKLE FUSION Right 02/23/2017   Procedure: REVISION RIGHT ANKLE FUSION WITH STIMULAN ANTIBIOTIC BEADS;  Surgeon: Newt Minion, MD;  Location: [facility];  Service: Orthopedics;  Laterality: Right;  . APPLICATION OF WOUND VAC  05/26/2016  . FRACTURE SURGERY    . HARDWARE REMOVAL Right 03/19/2016   Procedure: HARDWARE REMOVAL RIGHT ANKLE;  Surgeon: Newt Minion, MD;  Location: [facility];  Service: Orthopedics;  Laterality: Right;  . ORIF ANKLE FRACTURE Right 01/18/2016   Procedure: OPEN REDUCTION INTERNAL FIXATION (ORIF) ANKLE FRACTURE;  Surgeon: Newt Minion, MD;  Location: [facility];  Service: Orthopedics;  Laterality: Right;  . TIBIA DEBRIDEMENT Right 05/26/2016   Social History   Occupational History  . Occupation: Psychologist, occupational: [employer]  Tobacco Use  . Smoking status: Current Every Day Smoker    Packs/day: 0.50    Years: 14.00    Pack years: 7.00    Types: Cigarettes  . Smokeless tobacco: Never Used  Substance and Sexual Activity  . Alcohol use: Yes    Comment: social  . Drug use: No  . Sexual activity: Yes    Partners: Female    Birth control/protection: None

## 2018-06-13 ENCOUNTER — Other Ambulatory Visit (INDEPENDENT_AMBULATORY_CARE_PROVIDER_SITE_OTHER): Payer: Self-pay | Admitting: Orthopedic Surgery

## 2018-06-13 ENCOUNTER — Other Ambulatory Visit (INDEPENDENT_AMBULATORY_CARE_PROVIDER_SITE_OTHER): Payer: Self-pay

## 2018-06-13 MED ORDER — OXYCODONE-ACETAMINOPHEN 5-325 MG PO TABS: 1.0000 | ORAL_TABLET | Freq: Three times a day (TID) | ORAL | 0 refills | Status: DC | PRN

## 2018-06-13 NOTE — Telephone Encounter (Signed)
Duda patient °

## 2018-06-13 NOTE — Telephone Encounter (Signed)
Erin please advise, thanks. 

## 2018-06-14 NOTE — Telephone Encounter (Signed)
Patient was called and LVM informing him that medication was denied at this time.

## 2018-06-19 ENCOUNTER — Ambulatory Visit: Admitting: Orthopedic Surgery

## 2018-06-26 ENCOUNTER — Ambulatory Visit: Admitting: Orthopedic Surgery

## 2018-07-03 ENCOUNTER — Other Ambulatory Visit (INDEPENDENT_AMBULATORY_CARE_PROVIDER_SITE_OTHER): Payer: Self-pay | Admitting: Orthopedic Surgery

## 2018-07-04 MED ORDER — TRAMADOL HCL 50 MG PO TABS: 50.0000 mg | ORAL_TABLET | Freq: Four times a day (QID) | ORAL | 0 refills | Status: DC | PRN

## 2018-07-04 NOTE — Telephone Encounter (Signed)
Please advise 

## 2018-07-10 ENCOUNTER — Ambulatory Visit: Admitting: Orthopedic Surgery

## 2018-07-28 ENCOUNTER — Other Ambulatory Visit (INDEPENDENT_AMBULATORY_CARE_PROVIDER_SITE_OTHER): Payer: Self-pay | Admitting: Orthopedic Surgery

## 2018-07-28 ENCOUNTER — Other Ambulatory Visit (INDEPENDENT_AMBULATORY_CARE_PROVIDER_SITE_OTHER): Payer: Self-pay

## 2018-07-30 ENCOUNTER — Other Ambulatory Visit (INDEPENDENT_AMBULATORY_CARE_PROVIDER_SITE_OTHER): Payer: Self-pay | Admitting: Orthopedic Surgery

## 2018-07-31 ENCOUNTER — Other Ambulatory Visit: Payer: Self-pay | Admitting: Orthopedic Surgery

## 2018-07-31 MED ORDER — TRAMADOL HCL 50 MG PO TABS: 50.0000 mg | ORAL_TABLET | Freq: Four times a day (QID) | ORAL | 0 refills | Status: DC | PRN

## 2018-07-31 NOTE — Telephone Encounter (Signed)
Dr Duda please advise, thank you. 

## 2018-07-31 NOTE — Telephone Encounter (Signed)
Duda patient °

## 2018-07-31 NOTE — Telephone Encounter (Signed)
rx sent

## 2018-08-01 MED ORDER — OXYCODONE-ACETAMINOPHEN 5-325 MG PO TABS: 1.0000 | ORAL_TABLET | Freq: Three times a day (TID) | ORAL | 0 refills | Status: DC | PRN

## 2018-08-01 MED ORDER — TRAMADOL HCL 50 MG PO TABS: 50.0000 mg | ORAL_TABLET | Freq: Four times a day (QID) | ORAL | 0 refills | Status: DC | PRN

## 2018-08-07 ENCOUNTER — Ambulatory Visit: Admitting: Orthopedic Surgery

## 2018-08-14 ENCOUNTER — Ambulatory Visit (INDEPENDENT_AMBULATORY_CARE_PROVIDER_SITE_OTHER): Payer: Medicare Other | Admitting: Orthopedic Surgery

## 2018-08-14 ENCOUNTER — Encounter: Payer: Self-pay | Admitting: Orthopedic Surgery

## 2018-08-14 ENCOUNTER — Ambulatory Visit (INDEPENDENT_AMBULATORY_CARE_PROVIDER_SITE_OTHER): Payer: Medicare Other

## 2018-08-14 VITALS — Ht 69.0 in | Wt 246.0 lb

## 2018-08-14 DIAGNOSIS — Z981 Arthrodesis status: Secondary | ICD-10-CM | POA: Diagnosis not present

## 2018-08-14 NOTE — Progress Notes (Signed)
Office Visit Note   Patient: Scott Hebert           Date of Birth: [birth year]           MRN: [id number] Visit Date: 08/14/2018              Requested by: No referring provider defined for this encounter. PCP: Patient, [name]  Chief Complaint  Patient presents with  . Right Ankle - Follow-up    02/23/17 right ankle tibial- calcaneal revision fusion       HPI: Patient is a 58 year old gentleman who is status post right tibial calcaneal fusion revision.  Patient is currently full weightbearing in regular sneakers he states he feels well he states that for prolonged activities he does use his double upright braces but is not wearing them today.  Assessment & Plan: Visit Diagnoses:  1. S/P ankle fusion     Plan: We will follow-up in 4 weeks with repeat 2 view radiographs of the right ankle.  Patient requests no pain medicine today.  Follow-Up Instructions: Return in about 4 weeks (around 09/11/2018).   Ortho Exam  Patient is alert, oriented, no adenopathy, well-dressed, normal affect, normal respiratory effort. Examination patient has a slight antalgic gait but no pain with weightbearing.  His foot is plantigrade there are no plantar calluses or ulcers.  His ankle fusion is stable no redness no cellulitis no open ulcers.  Imaging: Xr Ankle Complete Right  Result Date: 08/14/2018 2 view radiographs of the right ankle shows stable tibial calcaneal fusion no loss of reduction no hardware failure  No images are attached to the encounter.  Labs: Lab Results  Component Value Date   HGBA1C 6.8 (H) 02/23/2017   ESRSEDRATE 39 (H) 11/22/2016   ESRSEDRATE 36 (H) 10/18/2016   ESRSEDRATE 16 08/12/2016   CRP 15.7 (H) 11/22/2016   CRP 13.2 (H) 10/18/2016   CRP 6.7 08/12/2016   REPTSTATUS 06/01/2016 FINAL 05/26/2016   GRAMSTAIN  05/26/2016    ABUNDANT WBC PRESENT,BOTH PMN AND MONONUCLEAR NO ORGANISMS SEEN    CULT NO GROWTH 5 DAYS 05/26/2016   LABORGA METHICILLIN RESISTANT  STAPHYLOCOCCUS AUREUS 05/26/2016     Lab Results  Component Value Date   ALBUMIN 4.6 01/17/2016    No results found for: MG No results found for: VD25OH  No results found for: PREALBUMIN CBC EXTENDED Latest Ref Rng & Units 02/23/2017 07/12/2016 05/26/2016  WBC 4.0 - 10.5 K/uL 6.6 4.8 6.9  RBC 4.22 - 5.81 MIL/uL 5.03 4.81 4.45  HGB 13.0 - 17.0 g/dL 13.5 11.7(L) 11.3(L)  HCT 39.0 - 52.0 % 42.4 37.6(L) 36.2(L)  PLT 150 - 400 K/uL 165 239 330  NEUTROABS 1,500 - 7,800 cells/uL - 2,496 -  LYMPHSABS 850 - 3,900 cells/uL - 1,632 -     Body mass index is 36.33 kg/m.  Orders:  Orders Placed This Encounter  Procedures  . XR Ankle Complete Right   No orders of the defined types were placed in this encounter.    Procedures: No procedures performed  Clinical Data: No additional findings.  ROS:  All other systems negative, except as noted in the HPI. Review of Systems  Objective: Vital Signs: Ht 5\' 9"  (1.753 m)   Wt 246 lb (111.6 kg)   BMI 36.33 kg/m   Specialty Comments:  No specialty comments available.  PMFS History: Patient Active Problem List   Diagnosis Date Noted  . Complex regional pain syndrome type 1 of right lower extremity 10/26/2016  .  Foot drop, right foot 09/27/2016  . H/O ankle fusion 06/09/2016  . Acute hematogenous osteomyelitis of right fibula ([place])   . MRSA infection   . Therapeutic drug monitoring   . Phlebitis after infusion   . Septic arthritis of right ankle ([place]) 05/26/2016  . 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 Closed extra-articular fracture of distal end of right tibia   . Closed fracture of distal end of fibula, unspecified fracture morphology, initial encounter 01/17/2016  . Allergy 12/07/2011   Past Medical History:  Diagnosis Date  . Allergy   . Arthritis    "right ankle" (05/26/2016)  . Depression   . 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 GERD (gastroesophageal reflux disease)   . Headache     migraines  . Infection    RLE; MRSA  . Pre-diabetes     Family History  Problem Relation Age of Onset  . Cancer Mother     Past Surgical History:  Procedure Laterality Date  . ANKLE ARTHROSCOPY Right 05/26/2016  . ANKLE ARTHROSCOPY Right 05/26/2016   Procedure: Irrigation and Debridement Right Fibula and Arthroscopic Debridement Right Ankle, Apply Wound VAC;  Surgeon: Newt Minion, MD;  Location: [facility];  Service: Orthopedics;  Laterality: Right;  . ANKLE FUSION Right 05/31/2016   Procedure: IRRIGATION AND DEBRIDEMENT RIGHT ANKLE WITH FUSION;  Surgeon: Newt Minion, MD;  Location: [facility];  Service: Orthopedics;  Laterality: Right;  . ANKLE FUSION Right 02/23/2017   Procedure: REVISION RIGHT ANKLE FUSION WITH STIMULAN ANTIBIOTIC BEADS;  Surgeon: Newt Minion, MD;  Location: [facility];  Service: Orthopedics;  Laterality: Right;  . APPLICATION OF WOUND VAC  05/26/2016  . FRACTURE SURGERY    . HARDWARE REMOVAL Right 03/19/2016   Procedure: HARDWARE REMOVAL RIGHT ANKLE;  Surgeon: Newt Minion, MD;  Location: [facility];  Service: Orthopedics;  Laterality: Right;  . ORIF ANKLE FRACTURE Right 01/18/2016   Procedure: OPEN REDUCTION INTERNAL FIXATION (ORIF) ANKLE FRACTURE;  Surgeon: Newt Minion, MD;  Location: [facility];  Service: Orthopedics;  Laterality: Right;  . TIBIA DEBRIDEMENT Right 05/26/2016   Social History   Occupational History  . Occupation: Psychologist, occupational: [employer]  Tobacco Use  . Smoking status: Current Every Day Smoker    Packs/day: 0.50    Years: 14.00    Pack years: 7.00    Types: Cigarettes  . Smokeless tobacco: Never Used  Substance and Sexual Activity  . Alcohol use: Yes    Comment: social  . Drug use: No  . Sexual activity: Yes    Partners: Female    Birth control/protection: None

## 2018-08-27 ENCOUNTER — Other Ambulatory Visit (INDEPENDENT_AMBULATORY_CARE_PROVIDER_SITE_OTHER): Payer: Self-pay | Admitting: Orthopedic Surgery

## 2018-08-28 MED ORDER — TRAMADOL HCL 50 MG PO TABS: 50.0000 mg | ORAL_TABLET | Freq: Four times a day (QID) | ORAL | 0 refills | Status: DC | PRN

## 2018-08-28 NOTE — Telephone Encounter (Signed)
Duda patient °

## 2018-08-28 NOTE — Telephone Encounter (Signed)
Do you want to refill? 

## 2018-09-01 ENCOUNTER — Telehealth: Payer: Self-pay | Admitting: Orthopedic Surgery

## 2018-09-01 MED ORDER — OXYCODONE-ACETAMINOPHEN 5-325 MG PO TABS: 1.0000 | ORAL_TABLET | Freq: Two times a day (BID) | ORAL | 0 refills | Status: DC | PRN

## 2018-09-01 NOTE — Telephone Encounter (Signed)
Patient called for refill for Tramaodol and Oxycodone.  Please call patient to advise.[phone]

## 2018-09-01 NOTE — Telephone Encounter (Signed)
Erin please advise, thanks. 

## 2018-09-12 ENCOUNTER — Ambulatory Visit (INDEPENDENT_AMBULATORY_CARE_PROVIDER_SITE_OTHER): Payer: Medicare Other | Admitting: Orthopedic Surgery

## 2018-09-12 ENCOUNTER — Ambulatory Visit (INDEPENDENT_AMBULATORY_CARE_PROVIDER_SITE_OTHER): Payer: Medicare Other

## 2018-09-12 ENCOUNTER — Encounter: Payer: Self-pay | Admitting: Orthopedic Surgery

## 2018-09-12 VITALS — Ht 69.0 in | Wt 246.0 lb

## 2018-09-12 DIAGNOSIS — Z981 Arthrodesis status: Secondary | ICD-10-CM | POA: Diagnosis not present

## 2018-09-12 MED ORDER — TRAMADOL HCL 50 MG PO TABS: 50.0000 mg | ORAL_TABLET | Freq: Four times a day (QID) | ORAL | 0 refills | Status: DC | PRN

## 2018-09-12 NOTE — Progress Notes (Signed)
Office Visit Note   Patient: Scott Hebert           Date of Birth: 1960/02/12           MRN: [id number] Visit Date: 09/12/2018              Requested by: No referring provider defined for this encounter. PCP: Patient, [name]  Chief Complaint  Patient presents with  . Right Ankle - Follow-up, Pain      HPI: Patient presents in follow-up status post right ankle fusion he states he has some soreness but no real pain he states he does have a little bit of a limp.  He states that he normally wears his brace with his shoe wear today he drove without wearing a brace and regular sneakers.  Assessment & Plan: Visit Diagnoses:  1. S/P ankle fusion     Plan: Refill prescription was sent in for the tramadol patient states the [facility] will not fill his tramadol prescription.  Patient will continue wearing the brace and follow-up as needed.  Follow-Up Instructions: Return if symptoms worsen or fail to improve.   Ortho Exam  Patient is alert, oriented, no adenopathy, well-dressed, normal affect, normal respiratory effort. Examination patient has a slight limp his foot is plantigrade he complains of pain through the midfoot more consistent with the overloading of the midfoot with the ankle fusion.  There are no plantar ulcers or calluses there is no redness no cellulitis no swelling  Imaging: Xr Ankle 2 Views Right  Result Date: 09/12/2018 2 view radiographs of the right ankle shows stable fusion of the ankle joint no hardware failure or loosening.  No images are attached to the encounter.  Labs: Lab Results  Component Value Date   HGBA1C 6.8 (H) 02/23/2017   ESRSEDRATE 39 (H) 11/22/2016   ESRSEDRATE 36 (H) 10/18/2016   ESRSEDRATE 16 08/12/2016   CRP 15.7 (H) 11/22/2016   CRP 13.2 (H) 10/18/2016   CRP 6.7 08/12/2016   REPTSTATUS 06/01/2016 FINAL 05/26/2016   GRAMSTAIN  05/26/2016    ABUNDANT WBC PRESENT,BOTH PMN AND MONONUCLEAR NO ORGANISMS SEEN    CULT NO GROWTH 5 DAYS  05/26/2016   LABORGA METHICILLIN RESISTANT STAPHYLOCOCCUS AUREUS 05/26/2016     Lab Results  Component Value Date   ALBUMIN 4.6 01/17/2016    No results found for: MG No results found for: VD25OH  No results found for: PREALBUMIN CBC EXTENDED Latest Ref Rng & Units 02/23/2017 07/12/2016 05/26/2016  WBC 4.0 - 10.5 K/uL 6.6 4.8 6.9  RBC 4.22 - 5.81 MIL/uL 5.03 4.81 4.45  HGB 13.0 - 17.0 g/dL 13.5 11.7(L) 11.3(L)  HCT 39.0 - 52.0 % 42.4 37.6(L) 36.2(L)  PLT 150 - 400 K/uL 165 239 330  NEUTROABS 1,500 - 7,800 cells/uL - 2,496 -  LYMPHSABS 850 - 3,900 cells/uL - 1,632 -     Body mass index is 36.33 kg/m.  Orders:  Orders Placed This Encounter  Procedures  . XR Ankle 2 Views Right   Meds ordered this encounter  Medications  . DISCONTD: traMADol (ULTRAM) 50 MG tablet    Sig: Take 1 tablet (50 mg total) by mouth every 6 (six) hours as needed for moderate pain.    Dispense:  30 tablet    Refill:  0  . traMADol (ULTRAM) 50 MG tablet    Sig: Take 1 tablet (50 mg total) by mouth every 6 (six) hours as needed for moderate pain.    Dispense:  30 tablet    Refill:  0     Procedures: No procedures performed  Clinical Data: No additional findings.  ROS:  All other systems negative, except as noted in the HPI. Review of Systems  Objective: Vital Signs: Ht 5\' 9"  (1.753 m)   Wt 246 lb (111.6 kg)   BMI 36.33 kg/m   Specialty Comments:  No specialty comments available.  PMFS History: Patient Active Problem List   Diagnosis Date Noted  . Complex regional pain syndrome type 1 of right lower extremity 10/26/2016  . Foot drop, right foot 09/27/2016  . H/O ankle fusion 06/09/2016  . Acute hematogenous osteomyelitis of right fibula ([place])   . MRSA infection   . Therapeutic drug monitoring   . Phlebitis after infusion   . Septic arthritis of right ankle ([place]) 05/26/2016  . 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 Closed extra-articular fracture of distal end of  right tibia   . Closed fracture of distal end of fibula, unspecified fracture morphology, initial encounter 01/17/2016  . Allergy 12/07/2011   Past Medical History:  Diagnosis Date  . Allergy   . Arthritis    "right ankle" (05/26/2016)  . Depression   . 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 GERD (gastroesophageal reflux disease)   . Headache    migraines  . Infection    RLE; MRSA  . Pre-diabetes     Family History  Problem Relation Age of Onset  . Cancer Mother     Past Surgical History:  Procedure Laterality Date  . ANKLE ARTHROSCOPY Right 05/26/2016  . ANKLE ARTHROSCOPY Right 05/26/2016   Procedure: Irrigation and Debridement Right Fibula and Arthroscopic Debridement Right Ankle, Apply Wound VAC;  Surgeon: Newt Minion, MD;  Location: [facility];  Service: Orthopedics;  Laterality: Right;  . ANKLE FUSION Right 05/31/2016   Procedure: IRRIGATION AND DEBRIDEMENT RIGHT ANKLE WITH FUSION;  Surgeon: Newt Minion, MD;  Location: [facility];  Service: Orthopedics;  Laterality: Right;  . ANKLE FUSION Right 02/23/2017   Procedure: REVISION RIGHT ANKLE FUSION WITH STIMULAN ANTIBIOTIC BEADS;  Surgeon: Newt Minion, MD;  Location: [facility];  Service: Orthopedics;  Laterality: Right;  . APPLICATION OF WOUND VAC  05/26/2016  . FRACTURE SURGERY    . HARDWARE REMOVAL Right 03/19/2016   Procedure: HARDWARE REMOVAL RIGHT ANKLE;  Surgeon: Newt Minion, MD;  Location: [facility];  Service: Orthopedics;  Laterality: Right;  . ORIF ANKLE FRACTURE Right 01/18/2016   Procedure: OPEN REDUCTION INTERNAL FIXATION (ORIF) ANKLE FRACTURE;  Surgeon: Newt Minion, MD;  Location: [facility];  Service: Orthopedics;  Laterality: Right;  . TIBIA DEBRIDEMENT Right 05/26/2016   Social History   Occupational History  . Occupation: Psychologist, occupational: [employer]  Tobacco Use  . Smoking status: Current Every Day Smoker    Packs/day: 0.50    Years: 14.00    Pack years: 7.00    Types: Cigarettes   . Smokeless tobacco: Never Used  Substance and Sexual Activity  . Alcohol use: Yes    Comment: social  . Drug use: No  . Sexual activity: Yes    Partners: Female    Birth control/protection: None

## 2018-10-11 ENCOUNTER — Other Ambulatory Visit: Payer: Self-pay | Admitting: Orthopedic Surgery

## 2018-10-11 ENCOUNTER — Other Ambulatory Visit: Payer: Self-pay

## 2018-10-12 MED ORDER — TRAMADOL HCL 50 MG PO TABS: 50.0000 mg | ORAL_TABLET | Freq: Four times a day (QID) | ORAL | 0 refills | Status: DC | PRN

## 2018-10-12 NOTE — Telephone Encounter (Signed)
Dr Duda, please advise, thank you 

## 2018-10-12 NOTE — Telephone Encounter (Signed)
Duda pt ?

## 2018-10-13 MED ORDER — OXYCODONE-ACETAMINOPHEN 5-325 MG PO TABS: 1.0000 | ORAL_TABLET | Freq: Two times a day (BID) | ORAL | 0 refills | Status: DC | PRN

## 2018-11-06 ENCOUNTER — Other Ambulatory Visit: Payer: Self-pay

## 2018-11-06 ENCOUNTER — Other Ambulatory Visit: Payer: Self-pay | Admitting: Orthopedic Surgery

## 2018-11-06 NOTE — Telephone Encounter (Signed)
Rx request 

## 2018-11-07 MED ORDER — TRAMADOL HCL 50 MG PO TABS: 50.0000 mg | ORAL_TABLET | Freq: Four times a day (QID) | ORAL | 0 refills | Status: DC | PRN

## 2018-11-08 MED ORDER — OXYCODONE-ACETAMINOPHEN 5-325 MG PO TABS: 1.0000 | ORAL_TABLET | Freq: Two times a day (BID) | ORAL | 0 refills | Status: DC | PRN

## 2018-12-04 ENCOUNTER — Other Ambulatory Visit: Payer: Self-pay | Admitting: Orthopedic Surgery

## 2018-12-04 ENCOUNTER — Other Ambulatory Visit: Payer: Self-pay

## 2018-12-04 MED ORDER — TRAMADOL HCL 50 MG PO TABS: 50.0000 mg | ORAL_TABLET | Freq: Four times a day (QID) | ORAL | 0 refills | Status: DC | PRN

## 2018-12-04 NOTE — Telephone Encounter (Signed)
rx sent

## 2018-12-04 NOTE — Telephone Encounter (Signed)
Do you want to refill? 

## 2018-12-04 NOTE — Telephone Encounter (Signed)
Duda patient °

## 2018-12-05 MED ORDER — OXYCODONE-ACETAMINOPHEN 5-325 MG PO TABS: 1.0000 | ORAL_TABLET | Freq: Two times a day (BID) | ORAL | 0 refills | Status: DC | PRN

## 2019-01-03 ENCOUNTER — Other Ambulatory Visit: Payer: Self-pay | Admitting: Orthopedic Surgery

## 2019-01-03 ENCOUNTER — Other Ambulatory Visit: Payer: Self-pay

## 2019-01-03 MED ORDER — TRAMADOL HCL 50 MG PO TABS: 50.0000 mg | ORAL_TABLET | Freq: Four times a day (QID) | ORAL | 0 refills | Status: DC | PRN

## 2019-01-03 NOTE — Telephone Encounter (Signed)
Duda pt ?

## 2019-01-03 NOTE — Telephone Encounter (Signed)
Do you wish to refill? 

## 2019-01-09 MED ORDER — OXYCODONE-ACETAMINOPHEN 5-325 MG PO TABS: 1.0000 | ORAL_TABLET | Freq: Two times a day (BID) | ORAL | 0 refills | Status: DC | PRN

## 2019-02-04 ENCOUNTER — Other Ambulatory Visit: Payer: Self-pay

## 2019-02-04 ENCOUNTER — Other Ambulatory Visit: Payer: Self-pay | Admitting: Orthopedic Surgery

## 2019-02-05 ENCOUNTER — Other Ambulatory Visit: Payer: Self-pay | Admitting: Orthopedic Surgery

## 2019-02-05 MED ORDER — TRAMADOL HCL 50 MG PO TABS: 50.0000 mg | ORAL_TABLET | Freq: Four times a day (QID) | ORAL | 0 refills | Status: DC | PRN

## 2019-02-05 NOTE — Telephone Encounter (Signed)
Sign off on this message I do not have the ability to close it out

## 2019-02-05 NOTE — Telephone Encounter (Signed)
Rx sent 

## 2019-02-05 NOTE — Telephone Encounter (Signed)
MD pt

## 2019-02-05 NOTE — Telephone Encounter (Signed)
02/23/17 pt is s/p a right ankle fusion. Do you wish to refill?

## 2019-02-06 MED ORDER — OXYCODONE-ACETAMINOPHEN 5-325 MG PO TABS: 1.0000 | ORAL_TABLET | Freq: Two times a day (BID) | ORAL | 0 refills | Status: DC | PRN

## 2019-03-07 ENCOUNTER — Other Ambulatory Visit: Payer: Self-pay | Admitting: Family

## 2019-03-07 ENCOUNTER — Other Ambulatory Visit: Payer: Self-pay | Admitting: Orthopedic Surgery

## 2019-03-08 ENCOUNTER — Other Ambulatory Visit: Payer: Self-pay | Admitting: Orthopedic Surgery

## 2019-03-08 MED ORDER — TRAMADOL HCL 50 MG PO TABS: 50.0000 mg | ORAL_TABLET | Freq: Four times a day (QID) | ORAL | 0 refills | Status: DC | PRN

## 2019-03-08 NOTE — Telephone Encounter (Signed)
Do you want to refill? 

## 2019-03-08 NOTE — Telephone Encounter (Signed)
MD.

## 2019-03-08 NOTE — Telephone Encounter (Signed)
Can you please sign off on this I do not have the clearance to complete the message.

## 2019-03-08 NOTE — Telephone Encounter (Signed)
Rx sent 

## 2019-03-09 MED ORDER — OXYCODONE-ACETAMINOPHEN 5-325 MG PO TABS: 1.0000 | ORAL_TABLET | Freq: Two times a day (BID) | ORAL | 0 refills | Status: DC | PRN

## 2019-04-11 ENCOUNTER — Other Ambulatory Visit: Payer: Self-pay | Admitting: Orthopedic Surgery

## 2019-04-11 ENCOUNTER — Other Ambulatory Visit: Payer: Self-pay | Admitting: Family

## 2019-04-11 DIAGNOSIS — D485 Neoplasm of uncertain behavior of skin: Secondary | ICD-10-CM | POA: Diagnosis not present

## 2019-04-11 DIAGNOSIS — L110 Acquired keratosis follicularis: Secondary | ICD-10-CM | POA: Diagnosis not present

## 2019-04-11 DIAGNOSIS — L918 Other hypertrophic disorders of the skin: Secondary | ICD-10-CM | POA: Diagnosis not present

## 2019-04-12 MED ORDER — TRAMADOL HCL 50 MG PO TABS: 50.0000 mg | ORAL_TABLET | Freq: Four times a day (QID) | ORAL | 0 refills | Status: DC | PRN

## 2019-04-12 NOTE — Telephone Encounter (Signed)
Duda patient °

## 2019-04-12 NOTE — Telephone Encounter (Signed)
Please advise 

## 2019-04-17 MED ORDER — OXYCODONE-ACETAMINOPHEN 5-325 MG PO TABS: 1.0000 | ORAL_TABLET | Freq: Two times a day (BID) | ORAL | 0 refills | Status: DC | PRN

## 2019-08-13 ENCOUNTER — Other Ambulatory Visit: Payer: Self-pay | Admitting: Family

## 2019-08-15 MED ORDER — OXYCODONE-ACETAMINOPHEN 5-325 MG PO TABS: 1.0000 | ORAL_TABLET | Freq: Two times a day (BID) | ORAL | 0 refills | Status: DC | PRN

## 2019-09-14 DIAGNOSIS — F17210 Nicotine dependence, cigarettes, uncomplicated: Secondary | ICD-10-CM | POA: Diagnosis not present

## 2019-09-14 DIAGNOSIS — J40 Bronchitis, not specified as acute or chronic: Secondary | ICD-10-CM | POA: Diagnosis not present

## 2019-09-14 DIAGNOSIS — J019 Acute sinusitis, unspecified: Secondary | ICD-10-CM | POA: Diagnosis not present

## 2019-09-22 ENCOUNTER — Other Ambulatory Visit: Payer: Self-pay | Admitting: Orthopedic Surgery

## 2019-09-22 ENCOUNTER — Other Ambulatory Visit: Payer: Self-pay | Admitting: Family

## 2019-09-27 DIAGNOSIS — R1310 Dysphagia, unspecified: Secondary | ICD-10-CM | POA: Diagnosis not present

## 2019-09-27 DIAGNOSIS — Z79899 Other long term (current) drug therapy: Secondary | ICD-10-CM | POA: Diagnosis not present

## 2019-09-27 DIAGNOSIS — J028 Acute pharyngitis due to other specified organisms: Secondary | ICD-10-CM | POA: Diagnosis not present

## 2019-09-27 DIAGNOSIS — Z6841 Body Mass Index (BMI) 40.0 and over, adult: Secondary | ICD-10-CM | POA: Diagnosis not present

## 2019-09-27 DIAGNOSIS — J029 Acute pharyngitis, unspecified: Secondary | ICD-10-CM | POA: Diagnosis not present

## 2019-09-27 DIAGNOSIS — R070 Pain in throat: Secondary | ICD-10-CM | POA: Diagnosis not present

## 2019-10-22 DIAGNOSIS — H524 Presbyopia: Secondary | ICD-10-CM | POA: Diagnosis not present

## 2019-10-22 DIAGNOSIS — Z0100 Encounter for examination of eyes and vision without abnormal findings: Secondary | ICD-10-CM | POA: Diagnosis not present

## 2019-10-22 DIAGNOSIS — E119 Type 2 diabetes mellitus without complications: Secondary | ICD-10-CM | POA: Diagnosis not present

## 2020-01-24 ENCOUNTER — Ambulatory Visit: Payer: Medicare HMO | Admitting: Podiatry

## 2020-02-14 ENCOUNTER — Ambulatory Visit (INDEPENDENT_AMBULATORY_CARE_PROVIDER_SITE_OTHER): Payer: Medicare HMO

## 2020-02-14 ENCOUNTER — Ambulatory Visit (INDEPENDENT_AMBULATORY_CARE_PROVIDER_SITE_OTHER): Payer: Medicare HMO | Admitting: Podiatry

## 2020-02-14 ENCOUNTER — Other Ambulatory Visit: Payer: Self-pay

## 2020-02-14 DIAGNOSIS — Z981 Arthrodesis status: Secondary | ICD-10-CM

## 2020-02-14 DIAGNOSIS — M79671 Pain in right foot: Secondary | ICD-10-CM

## 2020-02-14 DIAGNOSIS — M19271 Secondary osteoarthritis, right ankle and foot: Secondary | ICD-10-CM

## 2020-02-14 DIAGNOSIS — F17200 Nicotine dependence, unspecified, uncomplicated: Secondary | ICD-10-CM

## 2020-02-14 DIAGNOSIS — S82301P Unspecified fracture of lower end of right tibia, subsequent encounter for closed fracture with malunion: Secondary | ICD-10-CM

## 2020-02-14 DIAGNOSIS — E1165 Type 2 diabetes mellitus with hyperglycemia: Secondary | ICD-10-CM | POA: Diagnosis not present

## 2020-02-17 ENCOUNTER — Encounter: Payer: Self-pay | Admitting: Podiatry

## 2020-02-17 NOTE — Progress Notes (Signed)
Subjective:  Patient ID: Scott Hebert, male    DOB: May 03, 1960,  MRN: [id number]  Chief Complaint  Patient presents with  . Foot Pain    Right foot pain. PT stated that his DR had sent him here for chronic pain. He stated that he has had 5 previous surgeries.     60 y.o. male presents with the above complaint. History confirmed with patient.  He presents with right foot and ankle pain and deformity following multiple surgeries.  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  He then underwent debridement and septic ankle arthrodesis by Dr. Sharol Given.  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  He stated that it is very difficult for him to walk because he has severe chronic pain it is difficult for him to keep his balance and feels like he is always rolling on the inside of his ankle.  He describes all the pain in and around the ankle joint.  He has swelling every day.  He states that he is a current smoker, he tells me that he had quit smoking and was using nicotine patches during the time of his previous injury and surgeries.  He is also a type II diabetic now, on Metformin his A1c is 8.6%.  He was not diabetic at the time of his injuries and surgeries.  Objective:  Physical Exam: warm, good capillary refill, no trophic changes or ulcerative lesions and normal DP and PT pulses.  His light touch sensation is intact.  He has severe valgus deformity of the hindfoot and ankle.  Severe pain around the subtalar joint and ankle.  Minimal motion of the subtalar joint.  Moderate edema and warmth.  Minimal pain in the midfoot.  Antalgic gait.         Radiographs:  X-rays of the right foot and ankle were taken.  Minimal degenerative changes in the tarsometatarsal joints.  There is  instrumented arthrodesis of the ankle with an anterolateral plate and screws.  The distal screw appears to be backing out, appears to be in the interval between the cuboid and navicular on the oblique views.  Talus appears to be quite sclerotic there is some evidence of lucency between the tibia and talus although there is bridging.  Significant arthrosis of the subtalar joint.  Tibiotalar angle is 52 degrees on the lateral view and approximately 15 degrees in valgus on the AP view. Assessment:   1. Secondary osteoarthritis, right ankle and foot   2. Closed fracture of proximal end of right fibula with malunion, unspecified fracture morphology, subsequent encounter   3. Closed extra-articular fracture of distal end of right tibia with malunion, subsequent encounter   4. H/O ankle fusion   5. Uncontrolled type 2 diabetes mellitus with hyperglycemia ([place])   6. Current smoker      Plan:  Patient was evaluated and treated and all questions answered.  I reviewed the patient's history both in the chart and with him personally.  We went over his x-rays together as well as an ambulatory gait exam in the office.  Unfortunately he has had a severe injury and complications following ankle ORIF including wound complication and intra-articular MRSA infection which can be devastating.  This was further complicated by nonunion following ankle fusion.  It is unclear to  me if he ever did fully achieve nonunion following revision.  Would like to order a CT to evaluate this as well as the articular surface of the subtalar joint as this is where most of his pain today is centered around.  He likely has secondary osteoarthrosis of subtalar joint as well as significant 15 to 20 degree valgus deformity of the foot to the leg.  I suspect the loss of the fibula as a buttress against the calcaneus is led to significant valgus deformity developing.  I discussed operative and nonoperative treatment with him.  Considering his  current smoking status and diabetes I think operative treatment again carries significantly high risk.  He does have what sounds to be a double upright brace which he will bring to his next appointment.  Possible we may build to do fashion this with a double rocker soled shoe to improve his ambulation.  CT scan was ordered.  Will likely need further work-up with infectious lab surveillance such as ESR and potentially a white blood cell labeled bone scan to evaluate for any residual infection.  A total of more than 60 minutes was spent today on the review of the patients medical record including previous laboratory values, imaging studies, taking of the history, examining the patient, the ordering of procedures/labs/tests/medications, coordination of care, and documentation in the chart.   Return in about 1 month (around 03/13/2020) for after CT scan to review, bring brace with you to appointment.

## 2020-03-13 ENCOUNTER — Other Ambulatory Visit: Payer: TRICARE For Life (TFL)

## 2020-03-22 ENCOUNTER — Ambulatory Visit
Admission: RE | Admit: 2020-03-22 | Discharge: 2020-03-22 | Disposition: A | Discharge status: Home / Self Care | Payer: TRICARE For Life (TFL) | Source: Ambulatory Visit | Attending: Podiatry | Admitting: Podiatry

## 2020-03-22 DIAGNOSIS — S82301P Unspecified fracture of lower end of right tibia, subsequent encounter for closed fracture with malunion: Secondary | ICD-10-CM

## 2020-03-22 DIAGNOSIS — M19271 Secondary osteoarthritis, right ankle and foot: Secondary | ICD-10-CM

## 2020-03-22 DIAGNOSIS — Z981 Arthrodesis status: Secondary | ICD-10-CM

## 2020-03-24 ENCOUNTER — Ambulatory Visit (INDEPENDENT_AMBULATORY_CARE_PROVIDER_SITE_OTHER): Payer: Medicare HMO | Admitting: Podiatry

## 2020-03-24 ENCOUNTER — Other Ambulatory Visit: Payer: Self-pay

## 2020-03-24 DIAGNOSIS — F17200 Nicotine dependence, unspecified, uncomplicated: Secondary | ICD-10-CM

## 2020-03-24 DIAGNOSIS — M19271 Secondary osteoarthritis, right ankle and foot: Secondary | ICD-10-CM | POA: Diagnosis not present

## 2020-03-24 DIAGNOSIS — M21071 Valgus deformity, not elsewhere classified, right ankle: Secondary | ICD-10-CM | POA: Diagnosis not present

## 2020-03-24 DIAGNOSIS — M960 Pseudarthrosis after fusion or arthrodesis: Secondary | ICD-10-CM | POA: Diagnosis not present

## 2020-03-24 DIAGNOSIS — M879 Osteonecrosis, unspecified: Secondary | ICD-10-CM

## 2020-03-24 DIAGNOSIS — E1165 Type 2 diabetes mellitus with hyperglycemia: Secondary | ICD-10-CM

## 2020-03-27 NOTE — Progress Notes (Signed)
Subjective:  Patient ID: Scott Hebert, male    DOB: 1960-08-24,  MRN: [id number]  Chief Complaint  Patient presents with  . Foot Pain    PT stated that he is still having pain with that foot and that nothing has changed    60 y.o. male returns for follow-up regarding his right ankle with the above complaint. History confirmed with patient.  Since last visit he has completed the CT scan and is here for review   Previous history:  He presents with right foot and ankle pain and deformity following multiple surgeries.  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  He then underwent debridement and septic ankle arthrodesis by Dr. Sharol Given.  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  He stated that it is very difficult for him to walk because he has severe chronic pain it is difficult for him to keep his balance and feels like he is always rolling on the inside of his ankle.  He describes all the pain in and around the ankle joint.  He has swelling every day.  He states that he is a current smoker, he tells me that he had quit smoking and was using nicotine patches during the time of his previous injury and surgeries.  He is also a type II diabetic now, on Metformin his A1c is 8.6%.  He was not diabetic at the time of his injuries and surgeries.  Objective:  Physical Exam: warm, good capillary refill, no trophic changes or ulcerative lesions and normal DP and PT pulses.  His light touch sensation is intact.  He has severe valgus deformity of the hindfoot and ankle.  Severe pain around the subtalar joint and ankle.  Minimal motion of the subtalar joint.  Moderate edema and warmth.  Minimal pain in the midfoot.  Antalgic gait.         Radiographs:  X-rays of the right foot and  ankle were taken.  Minimal degenerative changes in the tarsometatarsal joints.  There is instrumented arthrodesis of the ankle with an anterolateral plate and screws.  The distal screw appears to be backing out, appears to be in the interval between the cuboid and navicular on the oblique views.  Talus appears to be quite sclerotic there is some evidence of lucency between the tibia and talus although there is bridging.  Significant arthrosis of the subtalar joint.  Tibiotalar angle is 52 degrees on the lateral view and approximately 15 degrees in valgus on the AP view.  Study Result  Narrative & Impression  CLINICAL DATA:  History of right ankle fusions.  EXAM: CT OF THE RIGHT ANKLE WITHOUT CONTRAST  TECHNIQUE: Multidetector CT imaging of the right ankle was performed according to the standard protocol. Multiplanar CT image reconstructions were also generated.  COMPARISON:  Numerous prior radiographs dating back to 2018.  FINDINGS: Surgical changes related to a prior attempted ankle fusion. There is a anterior plate and screws noted along the anterior aspect of the distal tibia and talus. No osseous fusion across the tibiotalar joint is identified. The talus is largely collapsed and fragmented. 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 Moderate degenerative changes at the talocalcaneal joints.  More anteriorly there is a  screw in the lateral navicular bone which is loose.  Marked cortical thickening of the entire distal tibial shaft. I do not see any definite CT findings to suggest septic arthritis or osteomyelitis. This all has the appearance of remote posttraumatic and postsurgical changes and probable areas of osteonecrosis.  IMPRESSION: 1. Surgical changes related to a prior failed ankle fusion. No osseous fusion across the tibiotalar joint is identified. Associated loose hardware. 2. The  talus is fragmented and could be due to remote ununited fractures or osteonecrosis. 3. I do not see any definite CT findings to suggest septic arthritis or osteomyelitis. This has the appearance of remote posttraumatic and postsurgical changes and probable areas of osteonecrosis. 4. Moderate degenerative changes at the talocalcaneal joints.   Electronically Signed   By: P.  Gallerani M.D.   On: 03/23/2020 10:05    Assessment:   1. Secondary osteoarthritis, right ankle and foot   2. Pseudarthrosis after fusion or arthrodesis   3. Acquired valgus deformity of right ankle   4. Osteonecrosis (HCC)   5. Uncontrolled type 2 diabetes mellitus with hyperglycemia (HCC)   6. Current smoker      Plan:  Patient was evaluated and treated and all questions answered.  I met with him today and reviewed his CT scan findings.  I discussed with him that due to his diabetes and smoking I think that any sort of surgical undertaking to recorrect his deformity would be quite significant would carry a significant amount of risk.  If he is able to lower his A1c to 7% and quit smoking I would consider this and he would likely need tibiotalar calcaneal arthrodesis with an intramedullary nail, femoral head allograft and significant biologic adjuvant therapy..  I think for now should get him a better brace for and what he currently has.  His double upright brace is uncomfortable, he does not wear it.  I think we should look at getting him an [place] brace fashion.  I wrote him a prescription for [facility] for this as well as extra-depth diabetic shoes to wear them.   Return in about 4 months (around 07/24/2020).   

## 2020-07-28 ENCOUNTER — Ambulatory Visit: Payer: Medicare HMO | Admitting: Podiatry

## 2020-08-11 ENCOUNTER — Other Ambulatory Visit: Payer: Self-pay

## 2020-08-11 ENCOUNTER — Ambulatory Visit (INDEPENDENT_AMBULATORY_CARE_PROVIDER_SITE_OTHER): Payer: Medicare HMO | Admitting: Podiatry

## 2020-08-11 DIAGNOSIS — M19271 Secondary osteoarthritis, right ankle and foot: Secondary | ICD-10-CM

## 2020-08-11 DIAGNOSIS — M960 Pseudarthrosis after fusion or arthrodesis: Secondary | ICD-10-CM | POA: Diagnosis not present

## 2020-08-11 DIAGNOSIS — M879 Osteonecrosis, unspecified: Secondary | ICD-10-CM | POA: Diagnosis not present

## 2020-08-11 DIAGNOSIS — M21071 Valgus deformity, not elsewhere classified, right ankle: Secondary | ICD-10-CM

## 2020-08-12 NOTE — Progress Notes (Signed)
Subjective:  Patient ID: Scott Hebert, male    DOB: 1960-10-26,  MRN: [id number]  Chief Complaint  Patient presents with   Foot Pain    Right foot and ankle pain, 4 month follow up    60 y.o. male returns for follow-up regarding his right ankle with the above complaint. History confirmed with patient.  He had to go through the [place] to get the brace and this is happening on Friday.  He said he has good days and bad days   Previous history:  He presents with right foot and ankle pain and deformity following multiple surgeries.  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  He then underwent debridement and septic ankle arthrodesis by Dr. Sharol Given.  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  He stated that it is very difficult for him to walk because he has severe chronic pain it is difficult for him to keep his balance and feels like he is always rolling on the inside of his ankle.  He describes all the pain in and around the ankle joint.  He has swelling every day.  He states that he is a current smoker, he tells me that he had quit smoking and was using nicotine patches during the time of his previous injury and surgeries.  He is also a type II diabetic now, on Metformin his A1c is 8.6%.  He was not diabetic at the time of his injuries and surgeries.  Objective:  Physical Exam: warm, good capillary refill, no trophic changes or ulcerative lesions and normal DP and PT pulses.  His light touch sensation is intact.  He has severe valgus deformity of the hindfoot and ankle.  Severe pain around the subtalar joint and ankle.  Minimal motion of the subtalar joint.  Moderate edema and warmth.  Minimal pain in the midfoot.  Antalgic gait.   Radiographs:  X-rays of the right foot and  ankle were taken.  Minimal degenerative changes in the tarsometatarsal joints.  There is instrumented arthrodesis of the ankle with an anterolateral plate and screws.  The distal screw appears to be backing out, appears to be in the interval between the cuboid and navicular on the oblique views.  Talus appears to be quite sclerotic there is some evidence of lucency between the tibia and talus although there is bridging.  Significant arthrosis of the subtalar joint.  Tibiotalar angle is 52 degrees on the lateral view and approximately 15 degrees in valgus on the AP view.  Study Result  Narrative & Impression  CLINICAL DATA:  History of right ankle fusions.   EXAM: CT OF THE RIGHT ANKLE WITHOUT CONTRAST   TECHNIQUE: Multidetector CT imaging of the right ankle was performed according to the standard protocol. Multiplanar CT image reconstructions were also generated.   COMPARISON:  Numerous prior radiographs dating back to 2018.   FINDINGS: Surgical changes related to a prior attempted ankle fusion. There is a anterior plate and screws noted along the anterior aspect of the distal tibia and talus. No osseous fusion across the tibiotalar joint is identified. The talus is largely collapsed and fragmented. 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 Moderate degenerative changes at the talocalcaneal joints.  More anteriorly there is a screw in the lateral navicular bone which is loose.   Marked cortical thickening of the entire distal tibial shaft. I do not see any definite CT findings to suggest septic arthritis or osteomyelitis. This all has the appearance of remote posttraumatic and postsurgical changes and probable areas of osteonecrosis.   IMPRESSION: 1. Surgical changes related to a prior failed ankle fusion. No osseous fusion across the tibiotalar joint is identified. Associated loose hardware. 2. The  talus is fragmented and could be due to remote ununited fractures or osteonecrosis. 3. I do not see any definite CT findings to suggest septic arthritis or osteomyelitis. This has the appearance of remote posttraumatic and postsurgical changes and probable areas of osteonecrosis. 4. Moderate degenerative changes at the talocalcaneal joints.     Electronically Signed   By: Marijo Sanes M.D.   On: 03/23/2020 10:05    Assessment:   No diagnosis found.    Plan:  Patient was evaluated and treated and all questions answered.  Overall remains about the same.  We discussed further surgical and nonsurgical treatment.  I think bracing is the best route he is keen to remain out of the operating room which I understand.  He is being fitted for his brace this Friday.  He will follow-up with me in a few months after he has been able to wear the brace and see if it is helpful or not.  Could consider injection to alleviate pain if necessary at some point 2.  Advised to continue working on his A1c getting down and stopping smoking   Return in about 4 months (around 12/11/2020) for re-check arthritis and bracing .

## 2020-10-09 ENCOUNTER — Ambulatory Visit (INDEPENDENT_AMBULATORY_CARE_PROVIDER_SITE_OTHER): Payer: Medicare HMO | Admitting: Podiatry

## 2020-10-09 ENCOUNTER — Other Ambulatory Visit: Payer: Self-pay

## 2020-10-09 ENCOUNTER — Ambulatory Visit (INDEPENDENT_AMBULATORY_CARE_PROVIDER_SITE_OTHER): Payer: Medicare HMO

## 2020-10-09 DIAGNOSIS — S92344A Nondisplaced fracture of fourth metatarsal bone, right foot, initial encounter for closed fracture: Secondary | ICD-10-CM

## 2020-10-09 DIAGNOSIS — S92324A Nondisplaced fracture of second metatarsal bone, right foot, initial encounter for closed fracture: Secondary | ICD-10-CM | POA: Diagnosis not present

## 2020-10-09 DIAGNOSIS — S92334A Nondisplaced fracture of third metatarsal bone, right foot, initial encounter for closed fracture: Secondary | ICD-10-CM | POA: Diagnosis not present

## 2020-10-09 DIAGNOSIS — S99921A Unspecified injury of right foot, initial encounter: Secondary | ICD-10-CM

## 2020-10-09 MED ORDER — OXYCODONE-ACETAMINOPHEN 5-325 MG PO TABS: 1.0000 | ORAL_TABLET | ORAL | 0 refills | Status: AC | PRN

## 2020-10-10 NOTE — Progress Notes (Signed)
  Subjective:  Patient ID: Scott Hebert, male    DOB: Nov 29, 1960,  MRN: [id number]  Chief Complaint  Patient presents with   Foot Injury     (xray)right foot injury    60 y.o. male returns for follow-up urgent visit with the above complaint. History confirmed with patient.  He felt something pop [place] the [place].  He was concerned that he injured his ankle fusion or the hardware broke  Objective:  Physical Exam: Pulses and sensation intact on the right foot he has significant pain and edema in the forefoot with swelling and ecchymosis. No images are attached to the encounter.  Radiographs: Multiple views x-ray of the right foot: Nondisplaced mid diaphyseal fracture of the second metatarsal and nondisplaced neck fractures of the fourth and third metatarsals Assessment:   1. Nondisplaced fracture of second metatarsal bone, right foot, initial encounter for closed fracture   2. Closed nondisplaced fracture of third metatarsal bone of right foot, initial encounter   3. Closed nondisplaced fracture of fourth metatarsal bone of right foot, initial encounter      Plan:  Patient was evaluated and treated and all questions answered.  I reviewed the radiographs with the patient in detail.  Thankfully he did not cause any periprosthetic fracture of his significant amount of hardware in his ankle fusion site.  I recommended nonoperative treatment for the metatarsal fractures.  I did prescribe him some pain medication for this acute fracture and reviewed its use and precautions.  He has CAM boot at home and will be WBAT in this.  Follow-up in 6 weeks for x-rays.  No follow-ups on file.

## 2020-10-24 ENCOUNTER — Telehealth: Payer: Self-pay | Admitting: Podiatry

## 2020-10-24 NOTE — Telephone Encounter (Signed)
Patient called the office wanting a refill of pain medication

## 2020-10-25 MED ORDER — ACETAMINOPHEN-CODEINE #3 300-30 MG PO TABS: 1.0000 | ORAL_TABLET | ORAL | 0 refills | Status: DC | PRN

## 2020-10-25 NOTE — Addendum Note (Signed)
Addended by: Boneta Lucks on: 10/25/2020 12:28 PM   Modules accepted: Orders

## 2020-11-06 ENCOUNTER — Ambulatory Visit (INDEPENDENT_AMBULATORY_CARE_PROVIDER_SITE_OTHER): Payer: Medicare HMO

## 2020-11-06 ENCOUNTER — Other Ambulatory Visit: Payer: Self-pay

## 2020-11-06 ENCOUNTER — Ambulatory Visit (INDEPENDENT_AMBULATORY_CARE_PROVIDER_SITE_OTHER): Payer: Medicare HMO | Admitting: Podiatry

## 2020-11-06 DIAGNOSIS — S92324G Nondisplaced fracture of second metatarsal bone, right foot, subsequent encounter for fracture with delayed healing: Secondary | ICD-10-CM | POA: Diagnosis not present

## 2020-11-06 DIAGNOSIS — M960 Pseudarthrosis after fusion or arthrodesis: Secondary | ICD-10-CM | POA: Diagnosis not present

## 2020-11-06 DIAGNOSIS — S92344A Nondisplaced fracture of fourth metatarsal bone, right foot, initial encounter for closed fracture: Secondary | ICD-10-CM | POA: Diagnosis not present

## 2020-11-06 DIAGNOSIS — S92334A Nondisplaced fracture of third metatarsal bone, right foot, initial encounter for closed fracture: Secondary | ICD-10-CM | POA: Diagnosis not present

## 2020-11-06 MED ORDER — OXYCODONE-ACETAMINOPHEN 5-325 MG PO TABS: 1.0000 | ORAL_TABLET | ORAL | 0 refills | Status: AC | PRN

## 2020-11-10 NOTE — Progress Notes (Signed)
  Subjective:  Patient ID: Scott Hebert, male    DOB: 12/22/1960,  MRN: [id number]  Chief Complaint  Patient presents with   Foot Injury     (xray)right foot injury    60 y.o. male returns for follow-up urgent visit with the above complaint. History confirmed with patient.  Has had some improvement but is still very painful he is very trying to wear the boot Objective:  Physical Exam: Pulses and sensation intact on the right foot he has significant pain and edema in the forefoot with swelling and ecchymosis. No images are attached to the encounter.  Radiographs: Multiple views x-ray of the right foot: New films taken today show increased callus formation but no complete fracture healing around the metatarsals Assessment:   1. Nondisplaced fracture of second metatarsal bone, right foot, subsequent encounter for fracture with delayed healing   2. Pseudarthrosis after fusion or arthrodesis   3. Closed nondisplaced fracture of third metatarsal bone of right foot, initial encounter   4. Closed nondisplaced fracture of fourth metatarsal bone of right foot, initial encounter      Plan:  Patient was evaluated and treated and all questions answered.  Continues to be quite painful for him.  I think he has been too active and been on his foot too much.  I recommended he rest is much as possible and try to stay off the foot is much as possible.  Also discussed the role of smoking and bone healing and recommend smoking cessation.  I did prescribe him additional pain medication and told him this likely would be the last time he required this for me.  I think chronically has ankle is quite painful and his PCP is also recommended chronic pain management.  I referred him to Dr. Hardin Negus at [facility] pain management he will call me to see if he can get new patient appointment  Return in about 5 weeks (around 12/11/2020).

## 2020-12-11 ENCOUNTER — Ambulatory Visit (INDEPENDENT_AMBULATORY_CARE_PROVIDER_SITE_OTHER): Payer: Medicare HMO

## 2020-12-11 ENCOUNTER — Other Ambulatory Visit: Payer: Self-pay

## 2020-12-11 ENCOUNTER — Ambulatory Visit (INDEPENDENT_AMBULATORY_CARE_PROVIDER_SITE_OTHER): Payer: Medicare HMO | Admitting: Podiatry

## 2020-12-11 DIAGNOSIS — S92334A Nondisplaced fracture of third metatarsal bone, right foot, initial encounter for closed fracture: Secondary | ICD-10-CM | POA: Diagnosis not present

## 2020-12-11 DIAGNOSIS — S92344A Nondisplaced fracture of fourth metatarsal bone, right foot, initial encounter for closed fracture: Secondary | ICD-10-CM | POA: Diagnosis not present

## 2020-12-11 DIAGNOSIS — S92324G Nondisplaced fracture of second metatarsal bone, right foot, subsequent encounter for fracture with delayed healing: Secondary | ICD-10-CM

## 2020-12-11 MED ORDER — OXYCODONE-ACETAMINOPHEN 5-325 MG PO TABS: 1.0000 | ORAL_TABLET | Freq: Every day | ORAL | 0 refills | Status: AC | PRN

## 2020-12-15 NOTE — Progress Notes (Signed)
  Subjective:  Patient ID: Scott Hebert, male    DOB: 10-Apr-1960,  MRN: [id number]  Chief Complaint  Patient presents with   Fracture    4 month follow up right foot    60 y.o. male returns for follow-up urgent visit with the above complaint. History confirmed with patient.  Continues to improve he has been wearing the boot but is still painful.  He was able to schedule with Dr. Hardin Negus at [facility] pain management for January Objective:  Physical Exam: Pulses and sensation intact on the right foot he has today only mild pain and edema in the forefoot, more so in the ankle and hindfoot now No images are attached to the encounter.  Radiographs: Multiple views x-ray of the right foot: New films taken today show increased callus formation, second is not completely healed with third and fourth are completely healed Assessment:   1. Nondisplaced fracture of second metatarsal bone, right foot, subsequent encounter for fracture with delayed healing   2. Closed nondisplaced fracture of third metatarsal bone of right foot, initial encounter   3. Closed nondisplaced fracture of fourth metatarsal bone of right foot, initial encounter      Plan:  Patient was evaluated and treated and all questions answered.  Has had a good amount of improvement clinically and radiographically.  I think he can begin to transition out of the cam boot at this point.  We again discussed smoking cessation how this relates to bone healing.  Should heal completely within the next month.  He has a appointment for chronic pain management with Dr. Hardin Negus at [facility] pain management next month.  He also says he needs to get rescheduled to get his brace fitted at the [place] which she will do.  I think this will be the solution for his hindfoot deformity for now, I do not think surgical reconstruction would be worth the significant risk that would carry.  I did provide him with an additional pain medication prescription until he  is able to see Dr. Hardin Negus.  Return if symptoms worsen or fail to improve.

## 2020-12-16 ENCOUNTER — Other Ambulatory Visit: Payer: Self-pay

## 2020-12-16 ENCOUNTER — Encounter (HOSPITAL_COMMUNITY): Payer: Self-pay | Admitting: Emergency Medicine

## 2020-12-16 ENCOUNTER — Emergency Department (HOSPITAL_COMMUNITY): Payer: Medicare HMO

## 2020-12-16 ENCOUNTER — Emergency Department (HOSPITAL_COMMUNITY)
Admission: EM | Admit: 2020-12-16 | Discharge: 2020-12-16 | Disposition: A | Discharge status: Home / Self Care | Payer: Medicare HMO | Attending: Emergency Medicine | Admitting: Emergency Medicine

## 2020-12-16 DIAGNOSIS — R42 Dizziness and giddiness: Secondary | ICD-10-CM | POA: Diagnosis not present

## 2020-12-16 DIAGNOSIS — R519 Headache, unspecified: Secondary | ICD-10-CM | POA: Diagnosis present

## 2020-12-16 DIAGNOSIS — Z5321 Procedure and treatment not carried out due to patient leaving prior to being seen by health care provider: Secondary | ICD-10-CM | POA: Diagnosis not present

## 2020-12-16 DIAGNOSIS — I10 Essential (primary) hypertension: Secondary | ICD-10-CM | POA: Insufficient documentation

## 2020-12-16 DIAGNOSIS — T50901A Poisoning by unspecified drugs, medicaments and biological substances, accidental (unintentional), initial encounter: Secondary | ICD-10-CM | POA: Diagnosis present

## 2020-12-16 LAB — COMPREHENSIVE METABOLIC PANEL
ALT: 25 U/L (ref 0–44)
AST: 16 U/L (ref 15–41)
Albumin: 3.8 g/dL (ref 3.5–5.0)
Alkaline Phosphatase: 72 U/L (ref 38–126)
Anion gap: 12 (ref 5–15)
BUN: 18 mg/dL (ref 6–20)
CO2: 22 mmol/L (ref 22–32)
Calcium: 9.4 mg/dL (ref 8.9–10.3)
Chloride: 99 mmol/L (ref 98–111)
Creatinine, Ser: 0.92 mg/dL (ref 0.61–1.24)
GFR, Estimated: >60 mL/min (ref >60)
Glucose, Bld: 164 mg/dL — ABNORMAL HIGH (ref 70–99)
Potassium: 4.1 mmol/L (ref 3.5–5.1)
Sodium: 133 mmol/L — ABNORMAL LOW (ref 135–145)
Total Bilirubin: 0.4 mg/dL (ref 0.3–1.2)
Total Protein: 7.9 g/dL (ref 6.5–8.1)

## 2020-12-16 LAB — RAPID URINE DRUG SCREEN, HOSP PERFORMED
Amphetamines: NOT DETECTED
Barbiturates: NOT DETECTED
Benzodiazepines: NOT DETECTED
Cocaine: POSITIVE — AB
Opiates: NOT DETECTED
Tetrahydrocannabinol: NOT DETECTED

## 2020-12-16 LAB — CBC
HCT: 42.5 % (ref 39.0–52.0)
Hemoglobin: 14.1 g/dL (ref 13.0–17.0)
MCH: 28.3 pg (ref 26.0–34.0)
MCHC: 33.2 g/dL (ref 30.0–36.0)
MCV: 85.2 fL (ref 80.0–100.0)
Platelets: 171 10*3/uL (ref 150–400)
RBC: 4.99 MIL/uL (ref 4.22–5.81)
RDW: 15.5 % (ref 11.5–15.5)
WBC: 9.2 10*3/uL (ref 4.0–10.5)
nRBC: 0 % (ref 0.0–0.2)

## 2020-12-16 LAB — ETHANOL: Alcohol, Ethyl (B): 26 mg/dL — ABNORMAL HIGH (ref <10)
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
## 2020-12-16 NOTE — ED Notes (Signed)
Patient left without being seen due to wait times
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
## 2020-12-25 ENCOUNTER — Telehealth: Payer: Self-pay | Admitting: Podiatry

## 2020-12-25 NOTE — Telephone Encounter (Signed)
Pharmacy  [facility] on [address]   Medication oxyCODONE-acetaminophen (PERCOCET/ROXICET) 5-325 MG tablet   Patient does not needs pain management until  Jan 20.2023

## 2020-12-29 NOTE — Telephone Encounter (Signed)
I previously sent a 30-day supply for this

## 2021-01-20 ENCOUNTER — Other Ambulatory Visit: Payer: Self-pay | Admitting: Podiatry

## 2021-01-21 NOTE — Telephone Encounter (Signed)
Please advise 

## 2021-10-29 ENCOUNTER — Emergency Department (HOSPITAL_COMMUNITY)
Admission: EM | Admit: 2021-10-29 | Discharge: 2021-10-29 | Discharge status: Against Medical Advice | Payer: Medicare PPO | Attending: Emergency Medicine | Admitting: Emergency Medicine

## 2021-10-29 ENCOUNTER — Encounter (HOSPITAL_COMMUNITY): Payer: Self-pay

## 2021-10-29 ENCOUNTER — Other Ambulatory Visit: Payer: Self-pay

## 2021-10-29 DIAGNOSIS — R079 Chest pain, unspecified: Secondary | ICD-10-CM | POA: Diagnosis present

## 2021-10-29 DIAGNOSIS — F419 Anxiety disorder, unspecified: Secondary | ICD-10-CM | POA: Insufficient documentation

## 2021-10-29 DIAGNOSIS — Z5321 Procedure and treatment not carried out due to patient leaving prior to being seen by health care provider: Secondary | ICD-10-CM | POA: Insufficient documentation

## 2021-10-29 LAB — CBC WITH DIFFERENTIAL/PLATELET
Abs Immature Granulocytes: 0.02 10*3/uL (ref 0.00–0.07)
Basophils Absolute: 0 10*3/uL (ref 0.0–0.1)
Basophils Relative: 0 %
Eosinophils Absolute: 0.1 10*3/uL (ref 0.0–0.5)
Eosinophils Relative: 1 %
HCT: 46.2 % (ref 39.0–52.0)
Hemoglobin: 15 g/dL (ref 13.0–17.0)
Immature Granulocytes: 0 %
Lymphocytes Relative: 32 %
Lymphs Abs: 2.6 10*3/uL (ref 0.7–4.0)
MCH: 28 pg (ref 26.0–34.0)
MCHC: 32.5 g/dL (ref 30.0–36.0)
MCV: 86.4 fL (ref 80.0–100.0)
Monocytes Absolute: 0.5 10*3/uL (ref 0.1–1.0)
Monocytes Relative: 6 %
Neutro Abs: 5 10*3/uL (ref 1.7–7.7)
Neutrophils Relative %: 61 %
Platelets: 185 10*3/uL (ref 150–400)
RBC: 5.35 MIL/uL (ref 4.22–5.81)
RDW: 15 % (ref 11.5–15.5)
WBC: 8.3 10*3/uL (ref 4.0–10.5)
nRBC: 0 % (ref 0.0–0.2)

## 2021-10-29 LAB — TROPONIN I (HIGH SENSITIVITY): Troponin I (High Sensitivity): 5 ng/L (ref <18)

## 2021-10-29 NOTE — ED Notes (Signed)
Patient stated he was feeling better. He stated that he was leaving. IV was removed and 2x2 applied with tape.

## 2021-10-29 NOTE — ED Triage Notes (Signed)
Arrives EMS from home. Around 3am began having substernal chest pains.   Upon EMS arrival pt says pain radiated to left chest and described it as an elephant sitting on his chest.   Took 1 Nitro SL, and '324mg'$  ASA. Pta.

## 2021-10-29 NOTE — ED Provider Triage Note (Signed)
Emergency Medicine Provider Triage Evaluation Note  Scott Hebert , a 61 y.o. male  was evaluated in triage.  Pt complains of presents with chest pain left-sided states that he felt slightly anxious but his chest pain is resolved he currently has no other complaints at this time no cardiac history.  Patient was referring to him in the third person, denies any suicidal homicidal ideations denies any illicit drug use.  Review of Systems  Positive: Chest pain, anxiety Negative: Shortness of breath nausea vomiting  Physical Exam  BP 139/82 (BP Location: Right Arm)   Pulse 100   Temp 98 F (36.7 C) (Oral)   Resp 17   Ht '5\' 9"'$  (1.753 m)   Wt 111.1 kg   SpO2 93%   BMI 36.18 kg/m  Gen:   Awake, no distress   Resp:  Normal effort  MSK:   Moves extremities without difficulty  Other:    Medical Decision Making  Medically screening exam initiated at 5:02 AM.  Appropriate orders placed.  Scott Hebert was informed that the remainder of the evaluation will be completed by another provider, this initial triage assessment does not replace that evaluation, and the importance of remaining in the ED until their evaluation is complete.  Lab work imaging been ordered will need further work-up.   Marcello Fennel, PA-C 10/29/21 0505

## 2021-12-30 ENCOUNTER — Encounter: Payer: TRICARE For Life (TFL) | Admitting: Registered"

## 2022-02-12 ENCOUNTER — Ambulatory Visit: Payer: TRICARE For Life (TFL) | Admitting: Dietician

## 2022-05-05 ENCOUNTER — Ambulatory Visit: Payer: TRICARE For Life (TFL) | Admitting: Skilled Nursing Facility1

## 2022-05-06 ENCOUNTER — Ambulatory Visit: Payer: TRICARE For Life (TFL) | Admitting: Skilled Nursing Facility1

## 2022-07-01 ENCOUNTER — Ambulatory Visit: Payer: TRICARE For Life (TFL) | Admitting: Skilled Nursing Facility1

## 2022-09-01 ENCOUNTER — Ambulatory Visit: Payer: TRICARE For Life (TFL) | Admitting: Skilled Nursing Facility1

## 2022-09-27 ENCOUNTER — Encounter: Payer: Self-pay | Admitting: Skilled Nursing Facility1

## 2022-09-27 ENCOUNTER — Encounter: Payer: Medicare PPO | Attending: Pharmacist | Admitting: Skilled Nursing Facility1

## 2022-09-27 VITALS — Ht 69.0 in | Wt 258.0 lb

## 2022-09-27 DIAGNOSIS — E118 Type 2 diabetes mellitus with unspecified complications: Secondary | ICD-10-CM | POA: Insufficient documentation

## 2022-09-27 DIAGNOSIS — Z713 Dietary counseling and surveillance: Secondary | ICD-10-CM | POA: Diagnosis not present

## 2022-09-27 DIAGNOSIS — E119 Type 2 diabetes mellitus without complications: Secondary | ICD-10-CM

## 2022-09-27 NOTE — Progress Notes (Unsigned)
DM meds:Metformin   Pt states his A1C is 9.8. Pt states he has had DM about since 2018. Pt states he has neuropathy in his fingers and feet.  Pt states he checks his blood sugars every 2 days sating he is getting a CGM.  Pt states he got a free membership to [employer].   Diabetes Self-Management Education  Visit Type: First/Initial  Appt. Start Time: 2:20  Appt. End Time: 3:20  09/28/2022  [name], identified by name and date of birth, is a 62 y.o. male with a diagnosis of Diabetes: Type 2.   ASSESSMENT  Height 5\' 9"  (1.753 m), weight 258 lb (117 kg). Body mass index is 38.1 kg/m.   Diabetes Self-Management Education - 09/27/22 1440       Visit Information   Visit Type First/Initial      Initial Visit   Diabetes Type Type 2    Are you currently following a meal plan? No    Are you taking your medications as prescribed? Yes      Health Coping   How would you rate your overall health? Fair      Psychosocial Assessment   Patient Belief/Attitude about Diabetes Defeat/Burnout    What is the hardest part about your diabetes right now, causing you the most concern, or is the most worrisome to you about your diabetes?   Making healty food and beverage choices    Self-care barriers Unable to determine    Self-management support Family;Friends    Other persons present Friend;Family Member    Patient Concerns Weight Control;Nutrition/Meal planning    Special Needs None    Learning Readiness Contemplating    How often do you need to have someone help you when you read instructions, pamphlets, or other written materials from your doctor or pharmacy? 3 - Sometimes      Pre-Education Assessment   Patient understands the diabetes disease and treatment process. Needs Instruction    Patient understands incorporating nutritional management into lifestyle. Needs Instruction    Patient undertands incorporating physical activity into lifestyle. Needs Instruction    Patient  understands using medications safely. Needs Instruction    Patient understands monitoring blood glucose, interpreting and using results Needs Instruction    Patient understands prevention, detection, and treatment of acute complications. Needs Instruction    Patient understands prevention, detection, and treatment of chronic complications. Needs Instruction    Patient understands how to develop strategies to address psychosocial issues. Needs Instruction    Patient understands how to develop strategies to promote health/change behavior. Needs Instruction      Complications   Last HgB A1C per patient/outside source 9.8 %    How often do you check your blood sugar? 3-4 times / week    Have you had a dilated eye exam in the past 12 months? No    Have you had a dental exam in the past 12 months? No    Are you checking your feet? No      Dietary Intake   Breakfast 2 eggs and meat    Lunch salad and meat      Activity / Exercise   Activity / Exercise Type ADL's    How many days per week do you exercise? 0    How many minutes per day do you exercise? 0    Total minutes per week of exercise 0      Patient Education   Previous Diabetes Education No    Disease  Pathophysiology Factors that contribute to the development of diabetes    Healthy Eating Plate Method;Food label reading, portion sizes and measuring food.;Role of diet in the treatment of diabetes and the relationship between the three main macronutrients and blood glucose level;Information on hints to eating out and maintain blood glucose control.    Being Active Role of exercise on diabetes management, blood pressure control and cardiac health.    Monitoring Taught/evaluated SMBG meter.;Yearly dilated eye exam;Daily foot exams    Acute complications Taught prevention, symptoms, and  treatment of hypoglycemia - the 15 rule.;Discussed and identified patients' prevention, symptoms, and treatment of hyperglycemia.    Chronic complications  Dental care;Retinopathy and reason for yearly dilated eye exams;Nephropathy, what it is, prevention of, the use of ACE, ARB's and early detection of through urine microalbumia.    Diabetes Stress and Support Role of stress on diabetes      Individualized Goals (developed by patient)   Nutrition Follow meal plan discussed;General guidelines for healthy choices and portions discussed    Physical Activity Exercise 1-2 times per week;30 minutes per day    Problem Solving Eating Pattern      Post-Education Assessment   Patient understands the diabetes disease and treatment process. Needs Review    Patient understands incorporating nutritional management into lifestyle. Needs Review    Patient undertands incorporating physical activity into lifestyle. Needs Review    Patient understands using medications safely. Needs Review    Patient understands monitoring blood glucose, interpreting and using results Needs Review    Patient understands prevention, detection, and treatment of acute complications. Needs Review    Patient understands prevention, detection, and treatment of chronic complications. Needs Review    Patient understands how to develop strategies to address psychosocial issues. Needs Review    Patient understands how to develop strategies to promote health/change behavior. Needs Review      Outcomes   Expected Outcomes Demonstrated interest in learning but significant barriers to change    Future DMSE PRN    Program Status Completed             Individualized Plan for Diabetes Self-Management Training:   Learning Objective:  Patient will have a greater understanding of diabetes self-management. Patient education plan is to attend individual and/or group sessions per assessed needs and concerns.   Expected Outcomes:  Demonstrated interest in learning but significant barriers to change  Education material provided: ADA - How to Thrive: A Guide for Your Journey with Diabetes,  Meal plan card, My Plate, and Snack sheet  If problems or questions, patient to contact team via:  Phone and Email  Future DSME appointment: PRN

## 2022-12-22 ENCOUNTER — Ambulatory Visit: Payer: TRICARE For Life (TFL) | Admitting: Dietician

## 2022-12-23 ENCOUNTER — Encounter: Payer: Self-pay | Admitting: Dietician

## 2022-12-23 ENCOUNTER — Encounter: Payer: Medicare PPO | Attending: Pharmacist | Admitting: Dietician

## 2022-12-23 VITALS — Wt 264.7 lb

## 2022-12-23 DIAGNOSIS — E119 Type 2 diabetes mellitus without complications: Secondary | ICD-10-CM | POA: Insufficient documentation

## 2022-12-23 DIAGNOSIS — Z794 Long term (current) use of insulin: Secondary | ICD-10-CM | POA: Insufficient documentation

## 2022-12-23 DIAGNOSIS — Z7984 Long term (current) use of oral hypoglycemic drugs: Secondary | ICD-10-CM | POA: Insufficient documentation

## 2022-12-23 NOTE — Progress Notes (Signed)
Diabetes Self-Management Education  Visit Type: Follow-up  Appt. Start Time: 1620 Appt. End Time: 1652  12/23/2022  [name], identified by name and date of birth, is a 62 y.o. male with a diagnosis of Diabetes: Type 2.   ASSESSMENT  Primary concern: diabetes management  History includes: allergies, arthritis, depression, type 2 diabetes, GERD Labs noted: recent A1c: 8.1 (pt report) Medications include: metformin, ozempic, glipizide Supplements: not assessed at this visit  Pt A1c down from 9.8 to 8.1 since previous visit.  Pt states he uses a Libre 3, but reports this is the third one that fell off early. This RD let the pt know he can get a free replacement by contacting the company and notifying them of his difficulty.  Pt states since previous visit he's cut back on portion size of carbohydrates, drank more water and reduced his alcohol intake.  Pt states he started going back to the gym 3x per week (at [employer]) 2 weeks ago.  Wt Readings from Last 3 Encounters:  12/23/22 264 lb 11.2 oz (120.1 kg)  09/27/22 258 lb (117 kg)  10/29/21 245 lb (111.1 kg)   Weight 264 lb 11.2 oz (120.1 kg). Body mass index is 39.09 kg/m.   Diabetes Self-Management Education - 12/23/22 1619       Visit Information   Visit Type Follow-up      Initial Visit   Diabetes Type Type 2    Are you taking your medications as prescribed? Yes      Psychosocial Assessment   Patient Belief/Attitude about Diabetes Motivated to manage diabetes    What is the hardest part about your diabetes right now, causing you the most concern, or is the most worrisome to you about your diabetes?   Making healty food and beverage choices;Checking blood sugar    Self-care barriers None    Self-management support Doctor's office;Family    Other persons present Patient    Patient Concerns Healthy Lifestyle    Special Needs None    Preferred Learning Style No preference indicated    Learning  Readiness Change in progress      Pre-Education Assessment   Patient understands the diabetes disease and treatment process. Needs Review    Patient understands incorporating nutritional management into lifestyle. Needs Review    Patient undertands incorporating physical activity into lifestyle. Needs Review    Patient understands using medications safely. Needs Review    Patient understands monitoring blood glucose, interpreting and using results Needs Review    Patient understands prevention, detection, and treatment of acute complications. Needs Review    Patient understands prevention, detection, and treatment of chronic complications. Needs Review    Patient understands how to develop strategies to address psychosocial issues. Needs Review    Patient understands how to develop strategies to promote health/change behavior. Needs Review      Complications   Last HgB A1C per patient/outside source 8.1 %    How often do you check your blood sugar? > 4 times/day    Number of hypoglycemic episodes per month 1    Can you tell when your blood sugar is low? Yes      Dietary Intake   Breakfast 7-9AM: 2 eggs, 2 sausages, hashbrowns or toast    Snack (morning) rasins or apples or salad    Lunch grapes or plum and 1/2 sandwich OR soup    Snack (afternoon) rasins or apples or salad    Dinner airfried chicken, string beans, with  pasta or rice    Snack (evening) rasins or apples or salad    Beverage(s) 64 oz water, 1/2 juice 1/2 water occassionally      Activity / Exercise   Activity / Exercise Type Moderate (swimming / aerobic walking)    How many days per week do you exercise? 3    How many minutes per day do you exercise? 30    Total minutes per week of exercise 90      Patient Education   Previous Diabetes Education Yes (please comment)    Disease Pathophysiology Factors that contribute to the development of diabetes;Explored patient's options for treatment of their diabetes    Healthy  Eating Meal timing in regards to the patients' current diabetes medication.;Role of diet in the treatment of diabetes and the relationship between the three main macronutrients and blood glucose level    Being Active Role of exercise on diabetes management, blood pressure control and cardiac health.;Identified with patient nutritional and/or medication changes necessary with exercise.    Medications Reviewed medication adjustment guidelines for hyperglycemia and sick days.    Monitoring Interpreting lab values - A1C, lipid, urine microalbumina.;Identified appropriate SMBG and/or A1C goals.;Yearly dilated eye exam    Acute complications Taught prevention, symptoms, and  treatment of hypoglycemia - the 15 rule.    Diabetes Stress and Support Identified and addressed patients feelings and concerns about diabetes    Lifestyle and Health Coping Lifestyle issues that need to be addressed for better diabetes care      Individualized Goals (developed by patient)   Nutrition General guidelines for healthy choices and portions discussed    Physical Activity 30 minutes per day;Exercise 3-5 times per week    Medications take my medication as prescribed    Monitoring  Consistenly use CGM    Problem Solving Eating Pattern;Medication consistency    Reducing Risk do foot checks daily;treat hypoglycemia with 15 grams of carbs if blood glucose less than 70mg /dL;examine blood glucose patterns    Health Coping Ask for help with psychological, social, or emotional issues      Patient Self-Evaluation of Goals - Patient rates self as meeting previously set goals (% of time)   Nutrition 50 - 75 % (half of the time)    Physical Activity 50 - 75 % (half of the time)    Medications >75% (most of the time)    Monitoring >75% (most of the time)    Problem Solving and behavior change strategies  >75% (most of the time)    Reducing Risk (treating acute and chronic complications) >75% (most of the time)    Health Coping  >75% (most of the time)      Post-Education Assessment   Patient understands the diabetes disease and treatment process. Comprehends key points    Patient understands incorporating nutritional management into lifestyle. Demonstrates understanding / competency    Patient undertands incorporating physical activity into lifestyle. Demonstrates understanding / competency    Patient understands using medications safely. Demonstrates understanding / competency    Patient understands monitoring blood glucose, interpreting and using results Demonstrates understanding / competency    Patient understands prevention, detection, and treatment of acute complications. Comprehends key points    Patient understands prevention, detection, and treatment of chronic complications. Needs Review    Patient understands how to develop strategies to address psychosocial issues. Needs Review    Patient understands how to develop strategies to promote health/change behavior. Comprehends key points  Outcomes   Expected Outcomes Demonstrated interest in learning. Expect positive outcomes    Future DMSE 3-4 months    Program Status Not Completed      Subsequent Visit   Since your last visit have you continued or begun to take your medications as prescribed? Yes    Since your last visit have you experienced any weight changes? Gain    Since your last visit, are you checking your blood glucose at least once a day? Yes             Individualized Plan for Diabetes Self-Management Training:   Learning Objective:  Patient will have a greater understanding of diabetes self-management. Patient education plan is to attend individual and/or group sessions per assessed needs and concerns.   Plan:   There are no Patient Instructions on file for this visit.  Expected Outcomes:  Demonstrated interest in learning. Expect positive outcomes  Education material provided: no handouts provided at this visit.  If  problems or questions, patient to contact team via:  Phone  Future DSME appointment: 3-4 months

## 2023-02-03 ENCOUNTER — Encounter (HOSPITAL_COMMUNITY): Payer: Self-pay | Admitting: *Deleted

## 2023-02-03 ENCOUNTER — Other Ambulatory Visit: Payer: Self-pay

## 2023-02-03 ENCOUNTER — Emergency Department (HOSPITAL_COMMUNITY)
Admission: EM | Admit: 2023-02-03 | Discharge: 2023-02-03 | Discharge status: Against Medical Advice | Payer: Medicare PPO | Attending: Emergency Medicine | Admitting: Emergency Medicine

## 2023-02-03 DIAGNOSIS — Z5321 Procedure and treatment not carried out due to patient leaving prior to being seen by health care provider: Secondary | ICD-10-CM | POA: Insufficient documentation

## 2023-02-03 DIAGNOSIS — L299 Pruritus, unspecified: Secondary | ICD-10-CM | POA: Diagnosis present

## 2023-02-03 DIAGNOSIS — R519 Headache, unspecified: Secondary | ICD-10-CM | POA: Diagnosis not present

## 2023-02-03 LAB — COMPREHENSIVE METABOLIC PANEL
ALT: 19 U/L (ref 0–44)
AST: 17 U/L (ref 15–41)
Albumin: 4.3 g/dL (ref 3.5–5.0)
Alkaline Phosphatase: 59 U/L (ref 38–126)
Anion gap: 12 (ref 5–15)
BUN: 17 mg/dL (ref 8–23)
CO2: 25 mmol/L (ref 22–32)
Calcium: 10 mg/dL (ref 8.9–10.3)
Chloride: 99 mmol/L (ref 98–111)
Creatinine, Ser: 0.85 mg/dL (ref 0.61–1.24)
GFR, Estimated: >60 mL/min (ref >60)
Glucose, Bld: 120 mg/dL — ABNORMAL HIGH (ref 70–99)
Potassium: 4.1 mmol/L (ref 3.5–5.1)
Sodium: 136 mmol/L (ref 135–145)
Total Bilirubin: 0.4 mg/dL (ref 0.0–1.2)
Total Protein: 8.1 g/dL (ref 6.5–8.1)

## 2023-02-03 LAB — CBC
HCT: 44.2 % (ref 39.0–52.0)
Hemoglobin: 14.3 g/dL (ref 13.0–17.0)
MCH: 27.4 pg (ref 26.0–34.0)
MCHC: 32.4 g/dL (ref 30.0–36.0)
MCV: 84.8 fL (ref 80.0–100.0)
Platelets: 184 10*3/uL (ref 150–400)
RBC: 5.21 MIL/uL (ref 4.22–5.81)
RDW: 16.4 % — ABNORMAL HIGH (ref 11.5–15.5)
WBC: 7.3 10*3/uL (ref 4.0–10.5)
nRBC: 0 % (ref 0.0–0.2)

## 2023-02-03 MED ORDER — DIPHENHYDRAMINE HCL 25 MG PO CAPS
50.0000 mg | ORAL_CAPSULE | Freq: Once | ORAL | Status: AC
  Administered 2023-02-03: 50 mg via ORAL
  Filled 2023-02-03: qty 2

## 2023-02-03 NOTE — ED Triage Notes (Signed)
Pt c/o a headache also getting a little better

## 2023-02-03 NOTE — ED Triage Notes (Signed)
Itching hands some feet itching for the past hour

## 2023-02-03 NOTE — ED Notes (Signed)
Pt eloped from the lobby. Pt name called x3

## 2023-02-03 NOTE — ED Notes (Signed)
Headache is gone now and the itching of his hands and feet is better

## 2023-02-03 NOTE — ED Triage Notes (Signed)
The pt reports that he is itching all over and he has meds  that he takes he reports that he is itching all over his body and he has not slept since 0130 am not sleeping  for several days [facility] [place]

## 2023-03-23 ENCOUNTER — Ambulatory Visit: Payer: TRICARE For Life (TFL) | Admitting: Dietician

## 2023-04-14 IMAGING — CT CT HEAD W/O CM
4 series · 17 of 47 positions shown, 19 images · non-contrast
Comparison: 06/20/2016

CLINICAL DATA: Headaches and blurry vision

EXAM:
CT HEAD WITHOUT CONTRAST
TECHNIQUE: Contiguous axial images were obtained from the base of the skull
through the vertex without intravenous contrast.

[Series 3: head wo · axial · 0.42mm/px · z∈[-208,-88]mm · 7 of 34 slices shown, 9 images]
[im 5/34  brain]
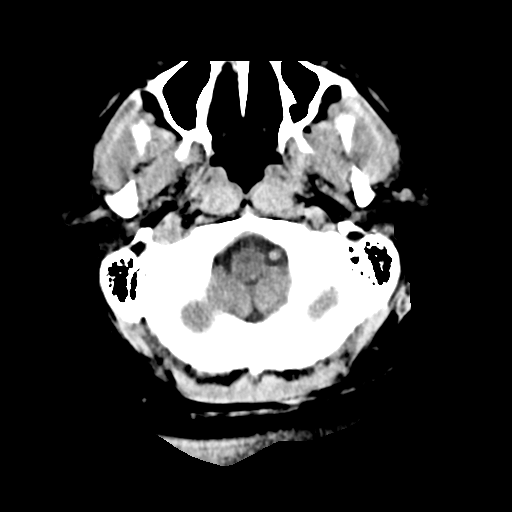
[im 5/34  bone]
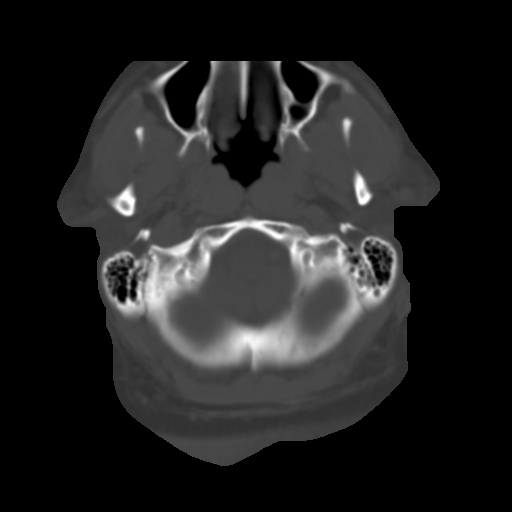
[im 9/34  brain]
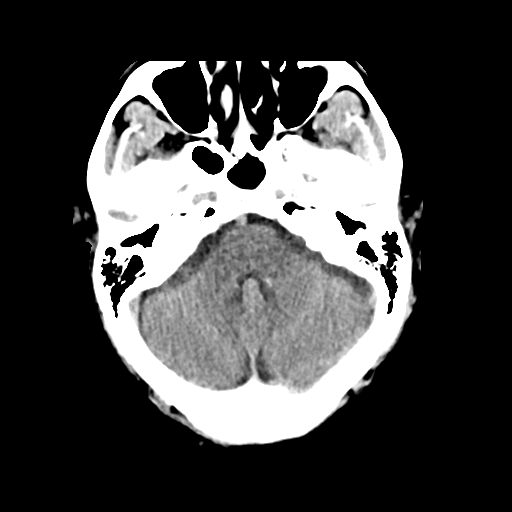
[im 13/34  brain]
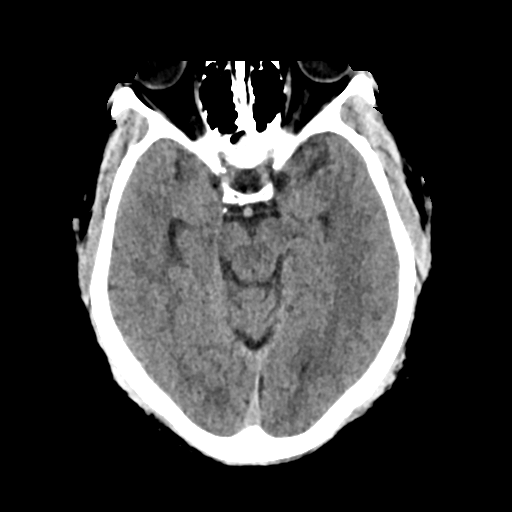
[im 17/34  brain]
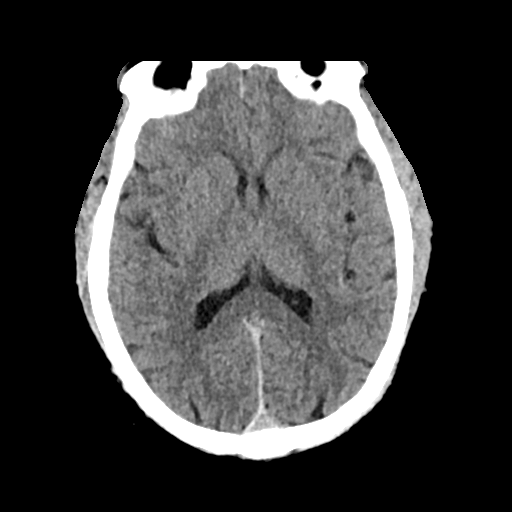
[im 21/34  brain]
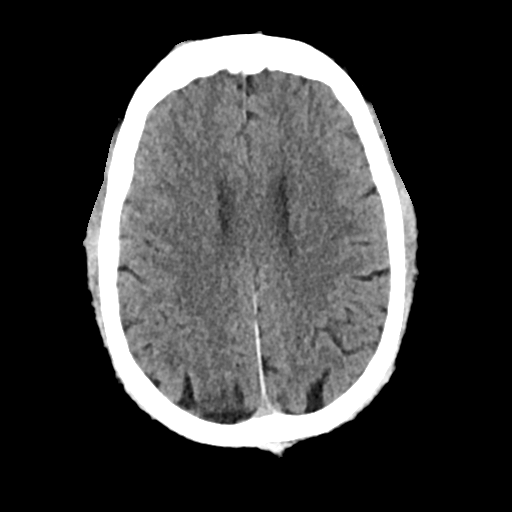
[im 21/34  bone]
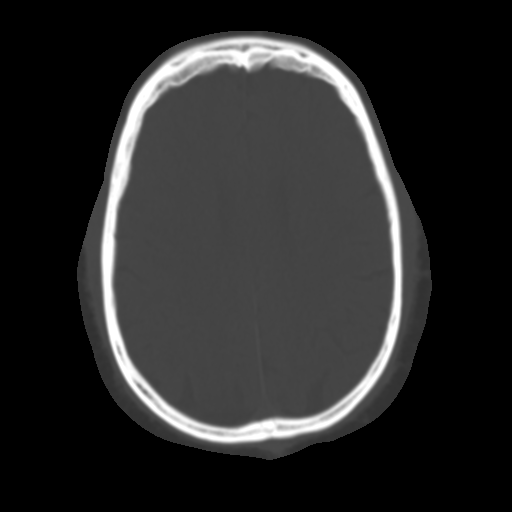
[im 25/34  brain]
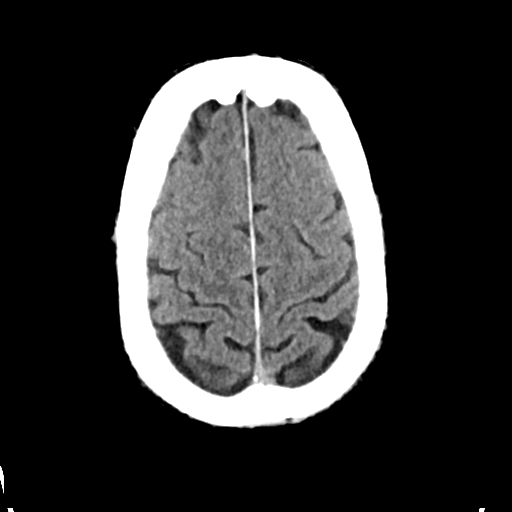
[im 29/34  brain]
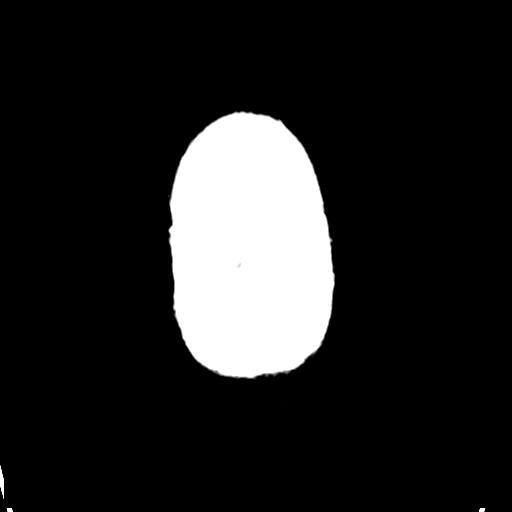

[Series 4: head bone · axial · 0.42mm/px · z∈[-212,-154]mm · 4 of 85 slices shown]
[im 9/85  bone]
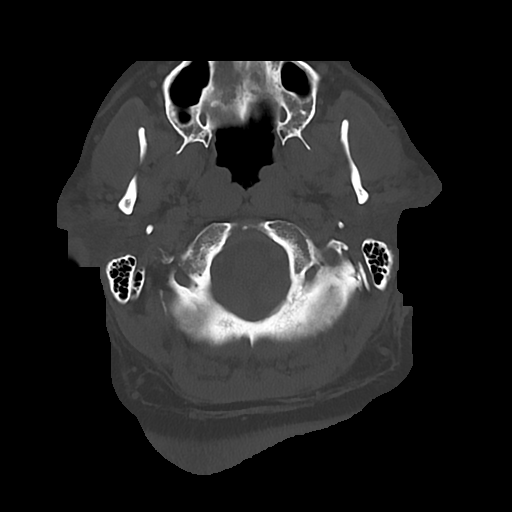
[im 17/85  bone]
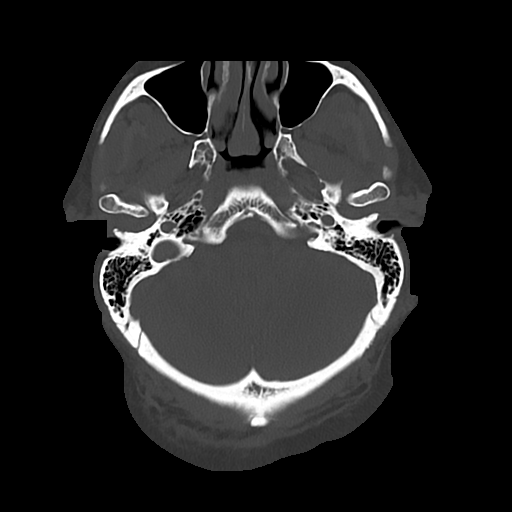
[im 26/85  bone]
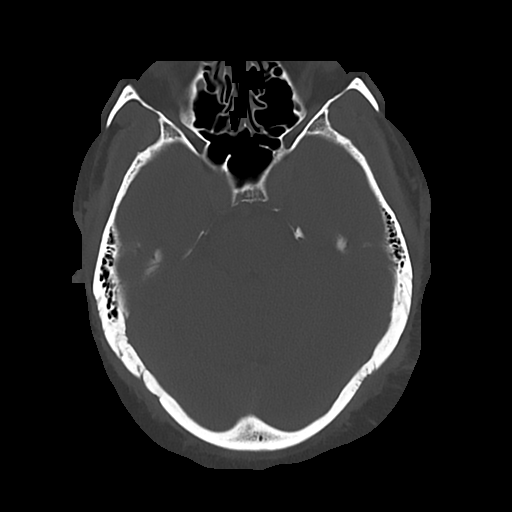
[im 38/85  bone]
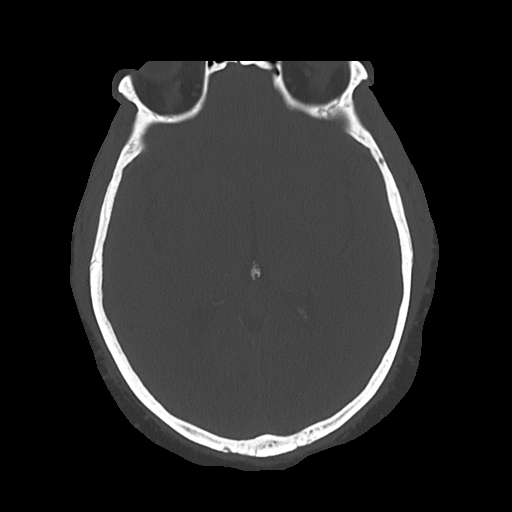

[Series 5: cor soft · coronal · 0.35mm/px · 3 of 80 slices shown]
[im 27/80  brain]
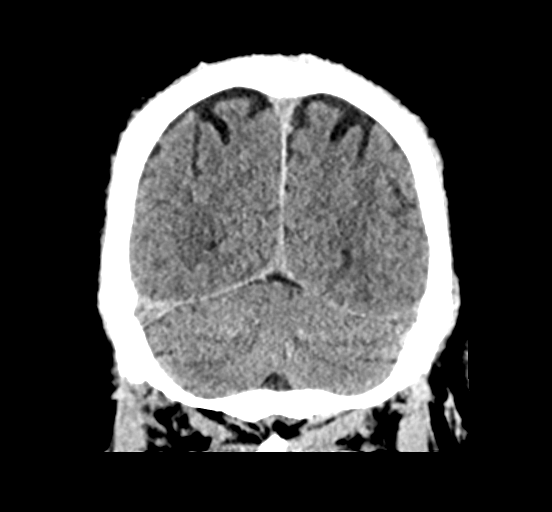
[im 36/80  brain]
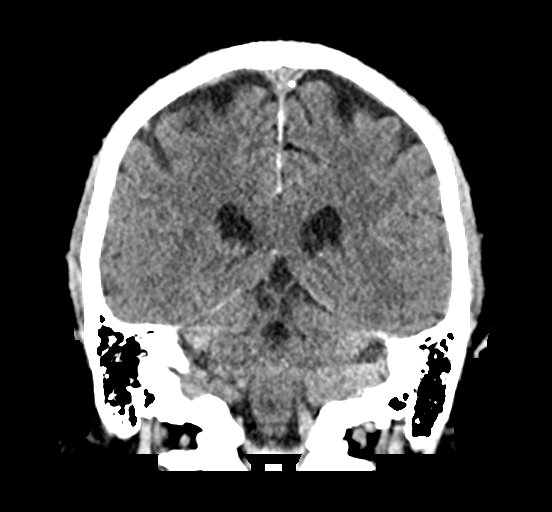
[im 44/80  brain]
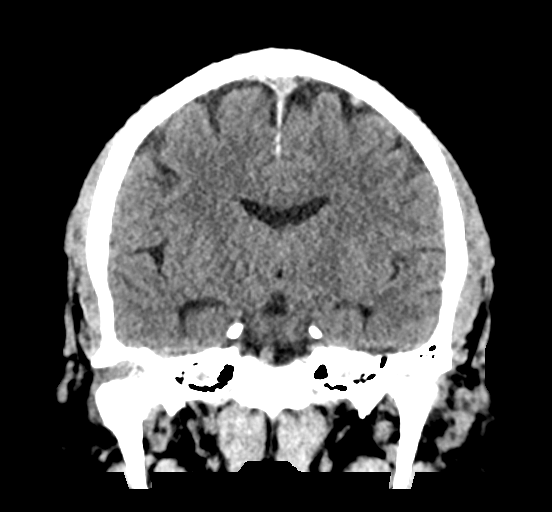

[Series 6: sag soft · sagittal · 0.35mm/px · 3 of 65 slices shown]
[im 22/65  brain]
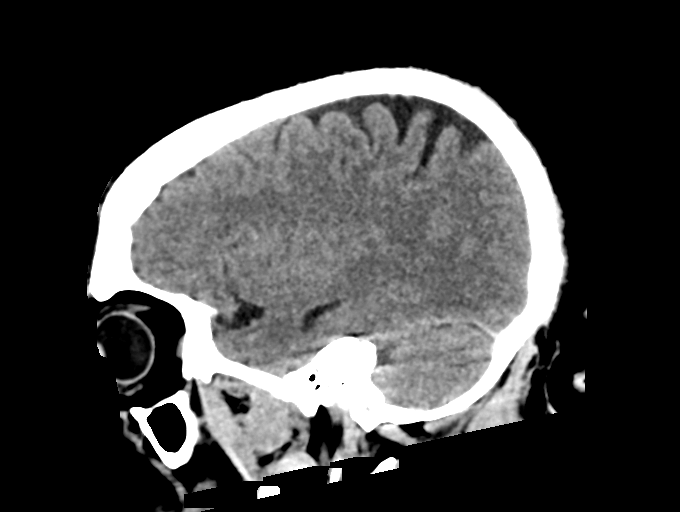
[im 33/65  brain]
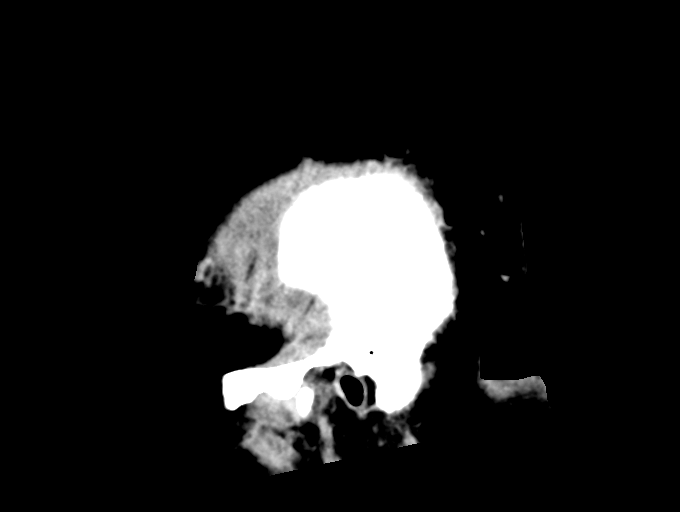
[im 43/65  brain]
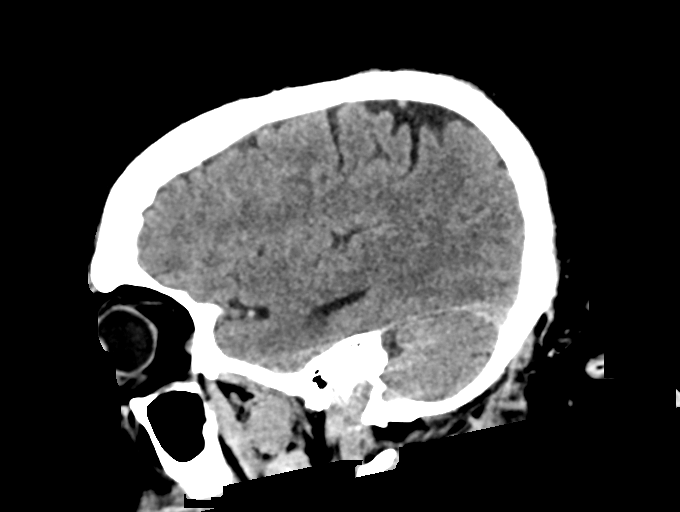

[17 of 47 positions shown; findings below may reference images not displayed]

FINDINGS: Brain: No evidence of acute infarction, hemorrhage, hydrocephalus,
extra-axial collection or mass lesion/mass effect.

Vascular: No hyperdense vessel or unexpected calcification.

Skull: Normal. Negative for fracture or focal lesion.

Sinuses/Orbits: No acute finding.

Other: None.
IMPRESSION: No acute intracranial abnormality noted.

## 2023-04-19 ENCOUNTER — Encounter: Attending: General Practice | Admitting: Dietician

## 2023-04-19 ENCOUNTER — Encounter: Payer: Self-pay | Admitting: Dietician

## 2023-04-19 VITALS — Wt 264.8 lb

## 2023-04-19 DIAGNOSIS — Z713 Dietary counseling and surveillance: Secondary | ICD-10-CM | POA: Diagnosis not present

## 2023-04-19 DIAGNOSIS — Z7984 Long term (current) use of oral hypoglycemic drugs: Secondary | ICD-10-CM | POA: Diagnosis not present

## 2023-04-19 DIAGNOSIS — Z6839 Body mass index (BMI) 39.0-39.9, adult: Secondary | ICD-10-CM | POA: Insufficient documentation

## 2023-04-19 DIAGNOSIS — E118 Type 2 diabetes mellitus with unspecified complications: Secondary | ICD-10-CM | POA: Diagnosis present

## 2023-04-19 DIAGNOSIS — E1165 Type 2 diabetes mellitus with hyperglycemia: Secondary | ICD-10-CM

## 2023-04-19 NOTE — Progress Notes (Signed)
 Diabetes Self-Management Education  Visit Type: Follow-up  Appt. Start Time: 1400 Appt. End Time: 1430  04/19/2023  [name], identified by name and date of birth, is a 63 y.o. male with a diagnosis of Diabetes: type 2 .   ASSESSMENT  Primary concern: diabetes management   History includes: allergies, arthritis, depression, type 2 diabetes, GERD Labs noted: A1c 01/13/23 8.4% Medications include: metformin, ozempic, glipizide Supplements: vitamin D, MVI   Wt Readings from Last 3 Encounters:  04/19/23 264 lb 12.8 oz (120.1 kg)  02/03/23 264 lb 12.4 oz (120.1 kg)  12/23/22 264 lb 11.2 oz (120.1 kg)   Pt states he started an exercise program and is going to the gym M[employer] W[employer] F and doing 5 minutes on the elliptical, 3 minutes on the treadmill, and then lifting weights.   Pt A1c up from 8.1% at previous visit to 8.4% in January 2025. Pt states he is still using his freestyle libre CGM but reports it often falls off sometimes after just 1 nights sleep. Pt states he has tried skin tac and over patches and it still falls off.   Pt reports he has not noticed any changes since starting ozempic 3 months ago. Pt states he feels sometimes his appetite is increased. Pt states he plans to talk about this with his doctor this Friday.   Weight 264 lb 12.8 oz (120.1 kg). Body mass index is 39.1 kg/m.   Diabetes Self-Management Education - 04/19/23 1400       Visit Information   Visit Type Follow-up      Health Coping   How would you rate your overall health? Good      Psychosocial Assessment   Patient Belief/Attitude about Diabetes Motivated to manage diabetes    What is the hardest part about your diabetes right now, causing you the most concern, or is the most worrisome to you about your diabetes?   Making healty food and beverage choices    Self-care barriers None    Self-management support Doctor's office    Other persons present Patient    Patient Concerns Nutrition/Meal  planning    Special Needs None    Preferred Learning Style No preference indicated    Learning Readiness Ready      Pre-Education Assessment   Patient understands the diabetes disease and treatment process. Needs Review    Patient understands incorporating nutritional management into lifestyle. Needs Review    Patient undertands incorporating physical activity into lifestyle. Needs Review    Patient understands using medications safely. Needs Review    Patient understands monitoring blood glucose, interpreting and using results Needs Review    Patient understands prevention, detection, and treatment of acute complications. Needs Review    Patient understands prevention, detection, and treatment of chronic complications. Needs Review    Patient understands how to develop strategies to address psychosocial issues. Needs Review    Patient understands how to develop strategies to promote health/change behavior. Needs Review      Complications   Last HgB A1C per patient/outside source 8.4 %    How often do you check your blood sugar? 1-2 times/day    Fasting Blood glucose range (mg/dL) 540-981;19-147      Dietary Intake   Breakfast 2 sausage, 2 egg, hashbrown    Snack (morning) none    Lunch salad with air fried chicken    Snack (afternoon) none OR 4-5 crackers    Dinner chicken with zuchinni and brown rice  Snack (evening) none    Beverage(s) 64 oz water, 1/2 glass minute maid      Activity / Exercise   Activity / Exercise Type Light (walking / raking leaves)    How many days per week do you exercise? 3    How many minutes per day do you exercise? 30    Total minutes per week of exercise 90      Patient Education   Previous Diabetes Education Yes (please comment)    Disease Pathophysiology Explored patient's options for treatment of their diabetes;Factors that contribute to the development of diabetes    Healthy Eating Role of diet in the treatment of diabetes and the  relationship between the three main macronutrients and blood glucose level;Plate Method;Reviewed blood glucose goals for pre and post meals and how to evaluate the patients' food intake on their blood glucose level.;Meal timing in regards to the patients' current diabetes medication.;Meal options for control of blood glucose level and chronic complications.    Being Active Role of exercise on diabetes management, blood pressure control and cardiac health.;Helped patient identify appropriate exercises in relation to his/her diabetes, diabetes complications and other health issue.    Medications Reviewed patients medication for diabetes, action, purpose, timing of dose and side effects.    Monitoring Identified appropriate SMBG and/or A1C goals.    Acute complications Discussed and identified patients' prevention, symptoms, and treatment of hyperglycemia.    Chronic complications Relationship between chronic complications and blood glucose control;Identified and discussed with patient  current chronic complications    Diabetes Stress and Support Identified and addressed patients feelings and concerns about diabetes;Worked with patient to identify barriers to care and solutions    Lifestyle and Health Coping Lifestyle issues that need to be addressed for better diabetes care      Individualized Goals (developed by patient)   Nutrition General guidelines for healthy choices and portions discussed    Physical Activity Exercise 3-5 times per week;30 minutes per day    Medications take my medication as prescribed    Monitoring  Test my blood glucose as discussed;Consistenly use CGM    Problem Solving Eating Pattern    Reducing Risk examine blood glucose patterns;do foot checks daily;treat hypoglycemia with 15 grams of carbs if blood glucose less than 70mg /dL    Health Coping Ask for help with psychological, social, or emotional issues      Patient Self-Evaluation of Goals - Patient rates self as meeting  previously set goals (% of time)   Nutrition 50 - 75 % (half of the time)    Physical Activity 50 - 75 % (half of the time)    Medications >75% (most of the time)    Monitoring >75% (most of the time)    Problem Solving and behavior change strategies  50 - 75 % (half of the time)    Reducing Risk (treating acute and chronic complications) 50 - 75 % (half of the time)    Health Coping 50 - 75 % (half of the time)      Post-Education Assessment   Patient understands the diabetes disease and treatment process. Comprehends key points    Patient understands incorporating nutritional management into lifestyle. Comprehends key points    Patient undertands incorporating physical activity into lifestyle. Comprehends key points    Patient understands using medications safely. Comphrehends key points    Patient understands monitoring blood glucose, interpreting and using results Comprehends key points    Patient understands prevention, detection,  and treatment of acute complications. Comprehends key points    Patient understands prevention, detection, and treatment of chronic complications. Comprehends key points    Patient understands how to develop strategies to address psychosocial issues. Comprehends key points    Patient understands how to develop strategies to promote health/change behavior. Comprehends key points      Outcomes   Expected Outcomes Demonstrated interest in learning. Expect positive outcomes    Future DMSE PRN    Program Status Completed      Subsequent Visit   Since your last visit have you continued or begun to take your medications as prescribed? Yes             Individualized Plan for Diabetes Self-Management Training:   Learning Objective:  Patient will have a greater understanding of diabetes self-management. Patient education plan is to attend individual and/or group sessions per assessed needs and concerns.   Plan:   There are no Patient Instructions on file  for this visit.  Expected Outcomes:  Demonstrated interest in learning. Expect positive outcomes  Education material provided: no handouts provided on this follow up  If problems or questions, patient to contact team via:  Phone  Future DSME appointment: PRN

## 2023-07-17 ENCOUNTER — Other Ambulatory Visit: Payer: Self-pay

## 2023-07-17 ENCOUNTER — Emergency Department (HOSPITAL_COMMUNITY)
Admission: EM | Admit: 2023-07-17 | Discharge: 2023-07-18 | Disposition: A | Discharge status: Home / Self Care | Attending: Emergency Medicine | Admitting: Emergency Medicine

## 2023-07-17 ENCOUNTER — Encounter (HOSPITAL_COMMUNITY): Payer: Self-pay | Admitting: *Deleted

## 2023-07-17 DIAGNOSIS — Z7982 Long term (current) use of aspirin: Secondary | ICD-10-CM | POA: Diagnosis not present

## 2023-07-17 DIAGNOSIS — I700 Atherosclerosis of aorta: Secondary | ICD-10-CM | POA: Diagnosis not present

## 2023-07-17 DIAGNOSIS — H532 Diplopia: Secondary | ICD-10-CM | POA: Diagnosis present

## 2023-07-17 DIAGNOSIS — Z7984 Long term (current) use of oral hypoglycemic drugs: Secondary | ICD-10-CM | POA: Insufficient documentation

## 2023-07-17 DIAGNOSIS — Z79899 Other long term (current) drug therapy: Secondary | ICD-10-CM | POA: Diagnosis not present

## 2023-07-17 DIAGNOSIS — E119 Type 2 diabetes mellitus without complications: Secondary | ICD-10-CM | POA: Insufficient documentation

## 2023-07-17 DIAGNOSIS — I6523 Occlusion and stenosis of bilateral carotid arteries: Secondary | ICD-10-CM | POA: Insufficient documentation

## 2023-07-17 DIAGNOSIS — R519 Headache, unspecified: Secondary | ICD-10-CM | POA: Insufficient documentation

## 2023-07-17 NOTE — ED Triage Notes (Signed)
 The pt has had a headache for one week he reports pain and visual disturbances in his rt eye also

## 2023-07-18 ENCOUNTER — Emergency Department (HOSPITAL_COMMUNITY)

## 2023-07-18 DIAGNOSIS — H532 Diplopia: Secondary | ICD-10-CM | POA: Diagnosis not present

## 2023-07-18 LAB — CBC WITH DIFFERENTIAL/PLATELET
Abs Immature Granulocytes: 0.01 K/uL (ref 0.00–0.07)
Basophils Absolute: 0 K/uL (ref 0.0–0.1)
Basophils Relative: 0 %
Eosinophils Absolute: 0.1 K/uL (ref 0.0–0.5)
Eosinophils Relative: 1 %
HCT: 40.5 % (ref 39.0–52.0)
Hemoglobin: 13.2 g/dL (ref 13.0–17.0)
Immature Granulocytes: 0 %
Lymphocytes Relative: 37 %
Lymphs Abs: 2.5 K/uL (ref 0.7–4.0)
MCH: 28.3 pg (ref 26.0–34.0)
MCHC: 32.6 g/dL (ref 30.0–36.0)
MCV: 86.7 fL (ref 80.0–100.0)
Monocytes Absolute: 0.6 K/uL (ref 0.1–1.0)
Monocytes Relative: 9 %
Neutro Abs: 3.6 K/uL (ref 1.7–7.7)
Neutrophils Relative %: 53 %
Platelets: 178 K/uL (ref 150–400)
RBC: 4.67 MIL/uL (ref 4.22–5.81)
RDW: 17.3 % — ABNORMAL HIGH (ref 11.5–15.5)
WBC: 6.7 K/uL (ref 4.0–10.5)
nRBC: 0 % (ref 0.0–0.2)

## 2023-07-18 LAB — COMPREHENSIVE METABOLIC PANEL WITH GFR
ALT: 18 U/L (ref 0–44)
AST: 19 U/L (ref 15–41)
Albumin: 3.4 g/dL — ABNORMAL LOW (ref 3.5–5.0)
Alkaline Phosphatase: 52 U/L (ref 38–126)
Anion gap: 10 (ref 5–15)
BUN: 18 mg/dL (ref 8–23)
CO2: 24 mmol/L (ref 22–32)
Calcium: 9 mg/dL (ref 8.9–10.3)
Chloride: 103 mmol/L (ref 98–111)
Creatinine, Ser: 1 mg/dL (ref 0.61–1.24)
GFR, Estimated: >60 mL/min (ref >60)
Glucose, Bld: 98 mg/dL (ref 70–99)
Potassium: 3.5 mmol/L (ref 3.5–5.1)
Sodium: 137 mmol/L (ref 135–145)
Total Bilirubin: 0.4 mg/dL (ref 0.0–1.2)
Total Protein: 6.5 g/dL (ref 6.5–8.1)

## 2023-07-18 LAB — I-STAT CHEM 8, ED
BUN: 28 mg/dL — ABNORMAL HIGH (ref 8–23)
Calcium, Ion: 1.05 mmol/L — ABNORMAL LOW (ref 1.15–1.40)
Chloride: 104 mmol/L (ref 98–111)
Creatinine, Ser: 0.9 mg/dL (ref 0.61–1.24)
Glucose, Bld: 102 mg/dL — ABNORMAL HIGH (ref 70–99)
HCT: 41 % (ref 39.0–52.0)
Hemoglobin: 13.9 g/dL (ref 13.0–17.0)
Potassium: 5.7 mmol/L — ABNORMAL HIGH (ref 3.5–5.1)
Sodium: 135 mmol/L (ref 135–145)
TCO2: 24 mmol/L (ref 22–32)

## 2023-07-18 LAB — APTT: aPTT: 39 s — ABNORMAL HIGH (ref 24–36)

## 2023-07-18 LAB — SEDIMENTATION RATE: Sed Rate: 15 mm/h (ref 0–16)

## 2023-07-18 LAB — PROTIME-INR
INR: 0.9 (ref 0.8–1.2)
Prothrombin Time: 12.8 s (ref 11.4–15.2)

## 2023-07-18 LAB — C-REACTIVE PROTEIN: CRP: 0.8 mg/dL (ref <1.0)

## 2023-07-18 MED ORDER — IOHEXOL 350 MG/ML SOLN
75.0000 mL | Freq: Once | INTRAVENOUS | Status: AC | PRN
  Administered 2023-07-18: 75 mL via INTRAVENOUS

## 2023-07-18 MED ORDER — BUTALBITAL-APAP-CAFFEINE 50-325-40 MG PO TABS
1.0000 | ORAL_TABLET | Freq: Once | ORAL | Status: AC
Start: 2023-07-18 — End: 2023-07-18
  Administered 2023-07-18: 1 via ORAL
  Filled 2023-07-18: qty 1

## 2023-07-18 MED ORDER — METOCLOPRAMIDE HCL 5 MG/ML IJ SOLN
10.0000 mg | Freq: Once | INTRAMUSCULAR | Status: AC
Start: 2023-07-18 — End: 2023-07-18
  Administered 2023-07-18: 10 mg via INTRAVENOUS
  Filled 2023-07-18: qty 2

## 2023-07-18 MED ORDER — TETRACAINE HCL 0.5 % OP SOLN
2.0000 [drp] | Freq: Once | OPHTHALMIC | Status: AC
  Administered 2023-07-18: 2 [drp] via OPHTHALMIC
  Filled 2023-07-18: qty 4

## 2023-07-18 NOTE — ED Notes (Signed)
 Contacted lab for 3rd time regarding labs, Lab verified they have received them and are running them at this time.

## 2023-07-18 NOTE — ED Notes (Addendum)
 Pt ambulated to the restroom for urine sample. Unable to provide sample at this time.

## 2023-07-18 NOTE — ED Provider Notes (Signed)
  EMERGENCY DEPARTMENT AT [facility] Provider Note   CSN: [id number] Arrival date & time: 07/17/23  2230     Patient presents with: Migraine   Scott Hebert is a 63 y.o. male with history of type 2 diabetes who presents with concern for 3 days of double vision.  He states that Thursday he started with severe right sided headache, with associated severe blurriness in vision in the right eye followed that then improved in clarity spontaneously. However, after blurriness resolved, he noted to have double vision.  Patient states that when he looks out of either eye individually his vision is not doubled but when he has both eyes open he sees double.  No numbness tingling weakness in the extremities, no change in speech, no facial droop per family.  Patient does not have history of CVA, history of type 2 diabetes.   HPI     Prior to Admission medications   Medication Sig Start Date End Date Taking? Authorizing Provider  albuterol  (VENTOLIN  HFA) 108 (90 Base) MCG/ACT inhaler  09/14/19   [provider]  amLODipine (NORVASC) 5 MG tablet  12/12/19   [provider]  aspirin  EC 325 MG tablet Take 1 tablet (325 mg total) by mouth daily. Patient taking differently: Take 325 mg by mouth daily after breakfast.  01/18/16   Harden Jerona GAILS, MD  atorvastatin (LIPITOR) 40 MG tablet TAKE ONE TABLET BY MOUTH AT BEDTIME FOR CHOLESTEROL 04/13/19   [provider]  Cholecalciferol (VITAMIN D3) 50000 units CAPS Take 50,000 Units by mouth every Sunday.    [provider]  diclofenac  (VOLTAREN ) 75 MG EC tablet TAKE 1 TABLET(75 MG) BY MOUTH TWICE DAILY WITH MEALS AS NEEDED FOR MILD PAIN OR MODERATE PAIN 07/31/18   Harden Jerona GAILS, MD  diclofenac  sodium (VOLTAREN ) 1 % GEL Apply 2 g topically 4 (four) times daily. 08/03/17   Harden Jerona GAILS, MD  doxycycline  (VIBRA -TABS) 100 MG tablet Take 1 tablet (100 mg total) by mouth 2 (two) times daily. 09/20/16   Luiz Channel, MD   ergocalciferol (VITAMIN D2) 1.25 MG (50000 UT) capsule  12/12/19   [provider]  gabapentin  (NEURONTIN ) 300 MG capsule Take 1 capsule (300 mg total) by mouth 3 (three) times daily. Start only to take at bedtime x 7 days Patient taking differently: Take 300 mg by mouth daily as needed (FOR PAIN.).  10/18/16   Luiz Channel, MD  lisinopril (ZESTRIL) 40 MG tablet TAKE ONE TABLET BY MOUTH DAILY - HOLD DOSE FOR SYSTOLIC BLOOD PRESSURE LESS THAN 100; AVOID POTASSIUM SUPPLEMENT/ARTIFICIAL SALT 02/08/20   [provider]  metFORMIN (GLUCOPHAGE) 1000 MG tablet  12/12/19   [provider]  omeprazole (PRILOSEC) 20 MG capsule TAKE 1 CAPSULE BY MOUTH DAILY (TAKE ON AN EMPTY STOMACH 30 MINUTES PRIOR TO A MEAL) 04/13/19   [provider]  Tetrahydrozoline-Zn Sulfate (VISINE-AC OP) Place 1-2 drops into both eyes 3 (three) times daily as needed (for dry eyes/allergy eyes.).    [provider]  traZODone  (DESYREL ) 50 MG tablet Take 1 tablet (50 mg total) by mouth at bedtime as needed for sleep. Start taking 1/2 tab at bedtime i 03/10/17   Luiz Channel, MD    Allergies: Shellfish-derived products, Cashew nut (anacardium occidentale) skin test, Other, and Shellfish allergy    Review of Systems  Eyes:  Positive for visual disturbance.  Neurological:  Positive for headaches.    Updated Vital Signs BP 132/77   Pulse 83  Temp 98 F (36.7 C)   Resp 18   Ht 5' 9 (1.753 m)   Wt 120.1 kg   SpO2 99%   BMI 39.10 kg/m   Physical Exam Vitals and nursing note reviewed.  Constitutional:      Appearance: He is obese. He is not ill-appearing or toxic-appearing.  HENT:     Head: Normocephalic and atraumatic.     Mouth/Throat:     Mouth: Mucous membranes are moist.     Pharynx: No oropharyngeal exudate or posterior oropharyngeal erythema.  Eyes:     General: Lids are normal.        Right eye: No discharge.        Left eye: No discharge.     Intraocular pressure:  Right eye pressure is 20 mmHg.     Extraocular Movements: Extraocular movements intact.     Conjunctiva/sclera: Conjunctivae normal.     Pupils: Pupils are equal, round, and reactive to light.     Comments: With each eye individually patient has normal vision in all visual fields, however with patient both eyes patient has diplopia only once he crosses midline looking toward the right visual field  Cardiovascular:     Rate and Rhythm: Normal rate and regular rhythm.     Pulses: Normal pulses.     Heart sounds: Normal heart sounds. No murmur heard. Pulmonary:     Effort: Pulmonary effort is normal. No respiratory distress.     Breath sounds: Normal breath sounds. No wheezing or rales.  Abdominal:     General: Bowel sounds are normal. There is no distension.     Palpations: Abdomen is soft.     Tenderness: There is no abdominal tenderness.  Musculoskeletal:        General: No deformity.     Cervical back: Neck supple.     Right lower leg: No edema.     Left lower leg: No edema.  Skin:    General: Skin is warm and dry.     Capillary Refill: Capillary refill takes less than 2 seconds.  Neurological:     General: No focal deficit present.     Mental Status: He is alert and oriented to person, place, and time. Mental status is at baseline.     GCS: GCS eye subscore is 4. GCS verbal subscore is 5. GCS motor subscore is 6.     Cranial Nerves: Cranial nerves 2-12 are intact.     Sensory: Sensation is intact.     Motor: Motor function is intact.     Coordination: Coordination is intact.     Gait: Gait is intact.  Psychiatric:        Mood and Affect: Mood normal.     (all labs ordered are listed, but only abnormal results are displayed) Labs Reviewed  CBC WITH DIFFERENTIAL/PLATELET - Abnormal; Notable for the following components:      Result Value   RDW 17.3 (*)    All other components within normal limits  COMPREHENSIVE METABOLIC PANEL WITH GFR - Abnormal; Notable for the following  components:   Albumin 3.4 (*)    All other components within normal limits  APTT - Abnormal; Notable for the following components:   aPTT 39 (*)    All other components within normal limits  I-STAT CHEM 8, ED - Abnormal; Notable for the following components:   Potassium 5.7 (*)    BUN 28 (*)    Glucose, Bld 102 (*)    Calcium,  Ion 1.05 (*)    All other components within normal limits  PROTIME-INR  C-REACTIVE PROTEIN  ETHANOL  RAPID URINE DRUG SCREEN, HOSP PERFORMED  SEDIMENTATION RATE    EKG: None  Radiology: MR BRAIN WO CONTRAST Result Date: 07/18/2023 EXAM: MRI BRAIN WITHOUT CONTRAST 07/18/2023 03:58:00 AM TECHNIQUE: Multiplanar multisequence MRI of the head/brain was performed without the administration of intravenous contrast. COMPARISON: CT angiography of the head and neck 07/18/2023 CLINICAL HISTORY: Diplopia. FINDINGS: BRAIN AND VENTRICLES: No acute infarct. No intracranial hemorrhage. No mass. No midline shift. No hydrocephalus. The sella is unremarkable. Normal flow voids. ORBITS: No acute abnormality. SINUSES AND MASTOIDS: No acute abnormality. BONES AND SOFT TISSUES: Normal marrow signal. No acute soft tissue abnormality. IMPRESSION: 1. No acute intracranial abnormality. Electronically signed by: Lonni Necessary MD 07/18/2023 04:14 AM EDT RP Workstation: [id number]   CT ANGIO HEAD NECK W WO CM Result Date: 07/18/2023 CLINICAL DATA:  Migraine and diplopia EXAM: CT ANGIOGRAPHY HEAD AND NECK WITH AND WITHOUT CONTRAST TECHNIQUE: Multidetector CT imaging of the head and neck was performed using the standard protocol during bolus administration of intravenous contrast. Multiplanar CT image reconstructions and MIPs were obtained to evaluate the vascular anatomy. Carotid stenosis measurements (when applicable) are obtained utilizing NASCET criteria, using the distal internal carotid diameter as the denominator. RADIATION DOSE REDUCTION: This exam was performed according to the  departmental dose-optimization program which includes automated exposure control, adjustment of the mA and/or kV according to patient size and/or use of iterative reconstruction technique. CONTRAST:  75mL OMNIPAQUE  IOHEXOL  350 MG/ML SOLN COMPARISON:  None Available. FINDINGS: CT HEAD FINDINGS Brain: No mass,hemorrhage or extra-axial collection. Normal appearance of the parenchyma and CSF spaces. Vascular: No hyperdense vessel or unexpected vascular calcification. Skull: The visualized skull base, calvarium and extracranial soft tissues are normal. Sinuses/Orbits: No fluid levels or advanced mucosal thickening of the visualized paranasal sinuses. No mastoid or middle ear effusion. Normal orbits. CTA NECK FINDINGS Skeleton: No acute abnormality or high grade bony spinal canal stenosis. Other neck: Normal pharynx, larynx and major salivary glands. No cervical lymphadenopathy. Unremarkable thyroid gland. Upper chest: No pneumothorax or pleural effusion. No nodules or masses. Aortic arch: There is calcific atherosclerosis of the aortic arch. Conventional 3 vessel aortic branching pattern. RIGHT carotid system: No dissection, occlusion or aneurysm. Mild atherosclerotic calcification at the carotid bifurcation without hemodynamically significant stenosis. LEFT carotid system: No dissection, occlusion or aneurysm. There is mixed density atherosclerosis extending into the proximal ICA, resulting in less than 50% stenosis. Vertebral arteries: Left dominant configuration. There is no dissection, occlusion or flow-limiting stenosis to the skull base (V1-V3 segments). CTA HEAD FINDINGS POSTERIOR CIRCULATION: Vertebral arteries are normal. No proximal occlusion of the anterior or inferior cerebellar arteries. Basilar artery is normal. Superior cerebellar arteries are normal. Posterior cerebral arteries are normal. ANTERIOR CIRCULATION: Intracranial internal carotid arteries are normal. Anterior cerebral arteries are normal.  Middle cerebral arteries are normal. Venous sinuses: As permitted by contrast timing, patent. Anatomic variants: None Review of the MIP images confirms the above findings. IMPRESSION: 1. No emergent large vessel occlusion or hemodynamically significant stenosis of the head or neck. 2. Bilateral carotid bifurcation atherosclerosis without hemodynamically significant stenosis. Aortic Atherosclerosis (ICD10-I70.0). Electronically Signed   By: Franky Stanford M.D.   On: 07/18/2023 02:08     .Critical Care  Performed by: Bobette Pleasant SAUNDERS, PA-C Authorized by: Bobette Pleasant SAUNDERS, PA-C   Critical care provider statement:    Critical care time (minutes):  60   Critical care  was time spent personally by me on the following activities:  Development of treatment plan with patient or surrogate, discussions with consultants, evaluation of patient's response to treatment, examination of patient, obtaining history from patient or surrogate, ordering and performing treatments and interventions, ordering and review of laboratory studies, ordering and review of radiographic studies, pulse oximetry and re-evaluation of patient's condition    Medications Ordered in the ED  metoCLOPramide  (REGLAN ) injection 10 mg (10 mg Intravenous Given 07/18/23 0155)  butalbital -acetaminophen -caffeine  (FIORICET ) 50-325-40 MG per tablet 1 tablet (1 tablet Oral Given 07/18/23 0155)  iohexol  (OMNIPAQUE ) 350 MG/ML injection 75 mL (75 mLs Intravenous Contrast Given 07/18/23 0147)  tetracaine  (PONTOCAINE) 0.5 % ophthalmic solution 2 drop (2 drops Right Eye Given by Other 07/18/23 0609)    Clinical Course as of 07/18/23 0702  Mon Jul 18, 2023  0506 Consults neurologist Dr. Vanessa who states that imaging is reassuring but he agrees that the transient losses of vision in the right eye are concerning and recommends consultation with ophthalmology.  Appreciate his collaboration in the care of this patient. [RS]  [id number] Consulted Dr. 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  He is requesting patient follow-up in his office in the next 48 hours for more thorough ocular exam but states that patient may be discharged at this time with ESR and CRP pending given reassuring neurowork-up.  I appreciate his collaboration of care of this patient. [RS]    Clinical Course User Index [RS] Artavius Stearns, Pleasant SAUNDERS, PA-C                                 Medical Decision Making 64 year old male presents with concern for headache and vision changes.  I labored in the [place] in the presence of his mom.  Neurologic exam nonfocal with the exception of binocular diplopia right visual field.  DDx includes but limited to CVA, TIA, optic neuritis, optic migraine, cranial nerve palsy glaucoma.  Amount and/or Complexity of Data Reviewed Labs: ordered.    Details: CBC unremarkable, CMPunremarkable, INR is normal.  CRP is negative at 0.8, ESR is pending.  Radiology: ordered.    Details:  CTA head and neck obtained to rule out which was reassuring brain is unremarkable.     Risk Prescription drug management.   No change in patient's vision initially after migraine cocktail, however after about 6 hours patient did begin to experience resolution of his headache with subsequent resolution of his diplopia.  Consults as above to both neurology and ophthalmology.  While the exact etiology speech symptoms remains unclear this time.  Any emergent condition at this time.  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  Will discharge patient with close outpatient follow-up with Dr. Medford Gaudy, ophthalmologist in the next 48 hours.  No intervention warranted at this time.  Azzam  voiced understanding of his medical evaluation and treatment plan. Each of their questions answered to their expressed satisfaction.  Return precautions were given.   Patient is well-appearing, stable, and was discharged in good condition.  This chart was dictated using voice recognition software, Dragon. Despite the best efforts of this provider to proofread and correct errors, errors may still occur which can change documentation meaning.      Final diagnoses:  Diplopia    ED Discharge Orders     None  Bobette Pleasant SAUNDERS, PA-C 07/18/23 [id number]    Trine Raynell Moder, MD 07/19/23 [phone]

## 2023-07-18 NOTE — Discharge Instructions (Signed)
 Your workup today was reassuring; you did not have a stroke. Please follow up with the ophthalmologist listed below in the next 48 hours. Call his office today and inform them that you were seen in the ER today and Dr. Medford Gaudy wanted you to follow up with him in the office Monday or Tuesday. Return to the ER with any new severe symptoms.

## 2023-07-18 NOTE — ED Notes (Signed)
 Patient transported to MRI

## 2023-07-18 NOTE — ED Notes (Signed)
 Patient transported to CT

## 2023-07-18 NOTE — ED Notes (Signed)
 Pt back from MRI
